# Patient Record
Sex: Female | Born: 1945
Health system: Southern US, Community
[De-identification: ages and names within clinical notes are randomized; demographics above are authoritative.]

## PROBLEM LIST (undated history)

## (undated) DIAGNOSIS — G479 Sleep disorder, unspecified: Secondary | ICD-10-CM

## (undated) DIAGNOSIS — I10 Essential (primary) hypertension: Secondary | ICD-10-CM

## (undated) DIAGNOSIS — Z8669 Personal history of other diseases of the nervous system and sense organs: Secondary | ICD-10-CM

## (undated) DIAGNOSIS — E119 Type 2 diabetes mellitus without complications: Secondary | ICD-10-CM

## (undated) DIAGNOSIS — Z9289 Personal history of other medical treatment: Secondary | ICD-10-CM

## (undated) DIAGNOSIS — M1990 Unspecified osteoarthritis, unspecified site: Secondary | ICD-10-CM

## (undated) DIAGNOSIS — M48061 Spinal stenosis, lumbar region without neurogenic claudication: Secondary | ICD-10-CM

## (undated) DIAGNOSIS — I639 Cerebral infarction, unspecified: Secondary | ICD-10-CM

## (undated) HISTORY — PX: CHOLECYSTECTOMY OPEN: SUR202

## (undated) HISTORY — PX: VAGINAL HYSTERECTOMY: SUR661

## (undated) HISTORY — PX: CERVICAL SPINE SURGERY: SHX589

## (undated) HISTORY — PX: CHOLECYSTECTOMY: SHX55

## (undated) HISTORY — PX: OOPHORECTOMY: SHX86

## (undated) HISTORY — PX: ABDOMINAL HYSTERECTOMY: SHX81

## (undated) HISTORY — PX: BLADDER SURGERY: SHX569

---

## 2006-12-27 ENCOUNTER — Ambulatory Visit: Payer: Self-pay | Admitting: Cardiology

## 2006-12-29 ENCOUNTER — Encounter (HOSPITAL_COMMUNITY): Admission: RE | Admit: 2006-12-29 | Discharge: 2007-01-28 | Payer: Self-pay | Admitting: Cardiology

## 2006-12-29 ENCOUNTER — Ambulatory Visit: Payer: Self-pay | Admitting: Cardiology

## 2007-01-02 ENCOUNTER — Ambulatory Visit: Payer: Self-pay | Admitting: Cardiology

## 2007-03-10 ENCOUNTER — Emergency Department (HOSPITAL_COMMUNITY): Admission: RE | Admit: 2007-03-10 | Discharge: 2007-03-10 | Payer: Self-pay | Admitting: Internal Medicine

## 2007-03-22 ENCOUNTER — Ambulatory Visit (HOSPITAL_COMMUNITY): Admission: RE | Admit: 2007-03-22 | Discharge: 2007-03-22 | Payer: Self-pay | Admitting: Orthopaedic Surgery

## 2007-03-28 ENCOUNTER — Encounter (HOSPITAL_COMMUNITY): Admission: RE | Admit: 2007-03-28 | Discharge: 2007-04-27 | Payer: Self-pay | Admitting: Orthopaedic Surgery

## 2007-07-26 ENCOUNTER — Ambulatory Visit (HOSPITAL_COMMUNITY): Admission: RE | Admit: 2007-07-26 | Discharge: 2007-07-27 | Payer: Self-pay | Admitting: Neurosurgery

## 2007-10-02 ENCOUNTER — Ambulatory Visit (HOSPITAL_COMMUNITY): Admission: RE | Admit: 2007-10-02 | Discharge: 2007-10-02 | Payer: Self-pay | Admitting: Neurosurgery

## 2008-01-23 ENCOUNTER — Ambulatory Visit (HOSPITAL_COMMUNITY): Admission: RE | Admit: 2008-01-23 | Discharge: 2008-01-23 | Payer: Self-pay | Admitting: Family Medicine

## 2008-03-07 ENCOUNTER — Encounter: Admission: RE | Admit: 2008-03-07 | Discharge: 2008-04-18 | Payer: Self-pay | Admitting: Specialist

## 2008-07-14 ENCOUNTER — Emergency Department (HOSPITAL_COMMUNITY): Admission: EM | Admit: 2008-07-14 | Discharge: 2008-07-14 | Payer: Self-pay | Admitting: Emergency Medicine

## 2011-04-13 NOTE — Op Note (Signed)
NAMEVONNIE, SPAGNOLO NO.:  [id number]   MEDICAL RECORD NO.:  [id number]          PATIENT TYPE:  OIB   LOCATION:  3027                         FACILITY:  [facility]   PHYSICIAN:  Hewitt Shorts, M.D.DATE OF BIRTH:  Feb 26, 1946   DATE OF PROCEDURE:  DATE OF DISCHARGE:                               OPERATIVE REPORT   PREOPERATIVE DIAGNOSES:  1. C5-6 cervical disk herniation.  2. Cervical spondylosis.  3. Cervical degenerative disk disease.  4. Cervical radiculopathy.   POSTOPERATIVE DIAGNOSES:  1. C5-6 cervical disk herniation.  2. Cervical spondylosis.  3. Cervical degenerative disk disease.  4. Cervical radiculopathy.   PROCEDURE:  C5-6 anterior cervical decompression and arthrodesis with  allograft and tethered cervical plating.   SURGEON:  Hewitt Shorts, M.D.   ASSISTANT:  Jerald Kief, NP and Coletta Memos, M.D.   ANESTHESIA:  General endotracheal.   INDICATIONS:  Patient is a 65 year old woman who presented with a right  cervical radiculopathy.  She was found to have advanced degenerative  disk disease and spondylosis at the C5-6 level with superimposed spinal  disk herniation.  Decision was made to proceed with anterior cervical  diskectomy and fusion.   PROCEDURE:  Patient was brought to the operating room, placed under  general endotracheal anesthesia.  Patient was placed in 10 pounds of  Holter traction.  The patient was prepped with Betadine and soap  solution and draped in a sterile fashion.  A horizontal incision was  made in the left side of the neck.  The line and incision were  infiltrated with local anesthetic with epinephrine.  Dissection was  carried down through the subcutaneous tissue and platysma and then  dissection was carried out through the avascular plane leaving the  sternocleidomastoid, carotid artery and jugular vein laterally and the  trachea and esophagus medially.  The central aspect of the vertebral  column was identified  and localizing x-rays taken.  The C5-6  intervertebral disk space was identified.  Diskectomy was begun with  incision of the annulus.  There were large ventral osteophytes that were  removed using the XMax drill and the osteophyte removal tool.  The disk  space was anterior and diskectomy performed using a variety of  microcurets and pituitary rongeurs.  The cartilaginous end plates were  removed using the microcurets along with the [facility] drill.  The microscope  was draped and brought into the field to provide additional  magnification, illumination, and visualization.  Then decompression was  continued using microdissection and microsurgical technique.  Posterior  osteophytic overgrowth was removed using the XMax drill and a 2 mm  Kerrison punch with a thin foot plate.  The posterior longitudinal  ligament was removed and spondylotic herniation was carefully removed,  decompressing the thecal sac and exiting C6 nerve roots.  Once the  spinal canal, thecal sac and neural foraminal nerve roots were  decompressed, hemostasis was accomplished using Gelfoam soaked in  thrombin.  We were able to remove the Gelfoam.  The wound was irrigated  and hemostasis confirmed.  We measured the height of the intervertebral  disk  space and selected a 7 mm implant, which was hydrated in saline  solution.  The allograft implant was then positioned in the  intervertebral disk space and countersunk.  Then we selected a 14 mm  tethered cervical plate.  It was positioned over the fusion concert and  secured to the vertebra by a pair of 4 x 13 mm variable angled screws at  each level.  Each of the screw holes were drilled and tapped and the  screws were placed in alternating fashion.  Once all 4 screws were in  place a final tightening was performed.  An x-ray was taken, which  showed the graft, plate and screws all to be in good position and  alignment to be good.  Then the wound was irrigated with Bacitracin   solution, checked for hemostasis which was confirmed and then we  proceeded with closure.  The platysma was closed with interrupted  inverted 2-0 undyed Vicryl sutures.  The subcutaneous and subcuticular  layers were closed with interrupted inverted 3-0 undyed Vicryl sutures  and the skin was re-approximated with Dermabond.  The procedure was  tolerated well.  The estimated blood loss was 50 mL.  Sponge and needle  count was correct.  Following surgery, patient was to be reversed from  anesthetic, extubated and transferred to the recovery room for further  care.      Hewitt Shorts, M.D.  Electronically Signed     RWN/MEDQ  D:  07/26/2007  T:  07/27/2007  Job:  [id number]   cc:   Hewitt Shorts, M.D.

## 2011-04-16 NOTE — Letter (Signed)
January 02, 2007    Virginia [name]  [address] [place]  [place], [place]  [address]   RE:  Virginia [name], Virginia [name]  MRN:  [id number]  /  DOB:  November 29, 1946   Dear Caryn Bee:   Virginia [name] returns to the office for continued assessment and  treatment of chest discomfort.  She has had no further episodes and thus  has not had occasion to use nitroglycerin.  She has felt fairly well  except for the day that she underwent stress testing.  That study showed  somewhat variable anteroseptal attenuation of activity which could be  related to breast shadowing.  A small degree of ischemia could not be  unequivocally excluded.   She has monitored blood pressure with good results.  Systolics have  typically been around 130 and diastolics around 80.  Her only medication  is lisinopril/HCTZ 20 mg/12.5 mg daily.   On exam, pleasant woman in no acute distress. The weight is 232, 3  pounds less than at her last visit.  Blood pressure 125/75.  Heart rate  100 and irregular.  Respirations 16.  NECK:  No jugular venous distention; normal carotid upstrokes without  bruits.  LUNGS:  Decreased breath sounds at the bases; otherwise clear.  CARDIAC:  Normal first and second heart sounds; fourth heart sound  present.  ABDOMEN:  Soft and nontender.  No organomegaly.  EXTREMITIES:  No edema.   IMPRESSION:  Virginia [name] is doing generally well.  We have excluded  high-risk coronary disease.  She will continue to monitor blood pressure  and symptoms and take nitroglycerin should chest discomfort recur.  I  will reassess this nice woman's symptoms in three months.    Sincerely,      Gerrit Friends. Dietrich Pates, MD, [facility]  Electronically Signed    RMR/MedQ  DD: 01/02/2007  DT: 01/02/2007  Job #: [id number]

## 2011-04-16 NOTE — Letter (Signed)
December 27, 2006    Virginia [name]  [address] [place]  [place], [place] [address]   RE:  LATEKA, RADY  MRN:  [id number]  /  DOB:  Nov 09, 1946   Dear Caryn Bee:   It was my pleasure evaluating Virginia [name] in consultation today at your  request.  As you know, this nice woman has had a history of hypertension  for at least the past 4 years.  She was treated transiently with a beta  blocker, but subsequently has received no treatment following weight  loss.  With subsequent weight gain, hypertension has recurred.  Systolic  blood pressures have been as high as 200.   Virginia [name] also has a recent history of chest discomfort, this  typically occurs at rest.  She describes sharp, intermittent mid  substernal pain of generally mild severity, but sometimes moderate with  radiation to the right jaw.  There is no associated dyspnea nor common  diaphoresis nor nausea.  She has quite a few additional symptoms  including insomnia, frequent urination, headaches, and fatigue.  She had  a sleep study at [facility] some years ago, revealing some sleep apnea, but  therapy was not recommended.   Lifestyle is sedentary.  The patient is most active when she is moving  about the office, which does not cause problems for her.  There is no  history of tobacco use.  She has had good cholesterol values in the  past.  She has no suggestion of diabetes.   PAST MEDICAL HISTORY:  Otherwise notable for multiple surgeries  including a hysterectomy and then a separate oophorectomy, subsequent  bladder resuspension, and cholecystectomy at age 65.  She had an episode  of sepsis for which she was transferred to [facility] but from  which she recovered without sequela.  She has not been hospitalized for  the past 10 years.  She suffered a retinal vein occlusion in the right  eye approximately 5 years ago that has resulted in blindness of that  eye.   The patient takes no medications routinely.  SHE HAS NO KNOWN  ALLERGIES.   SOCIAL HISTORY:  Works as a Public house manager in our [employer];  married with 2 children.   FAMILY HISTORY:  Father died at age 80 with congestive heart failure,  mother at age 52 with breast cancer.  Two siblings are alive and well.   REVIEW OF SYSTEMS:  Notable for the need for corrective lenses,  occasional palpitations, occasional nausea, urinary frequency and  nocturia, arthritic discomfort in the hands and intermittent edema of  the lower extremities.  All other systems reviewed and are negative.   EXAMINATION:  Overweight, pleasant woman.  The weight is 235, blood  pressure 170/90, heart rate 75 and regular, respirations 18.  HEENT:  Fundi are difficult to visualize; no gross abnormality is noted;  pupils are equal with good reaction to light on the left; EOMs full;  normal lids and conjunctivae.  Normal oral mucosa.  NECK: No jugular venous distention; normal carotid upstrokes without  bruits.  ENDOCRINE:  No thyromegaly.  HAEMATOPOIETIC:  No adenopathy.  LUNGS:  Clear.  CARDIAC:  Normal first and second heart sounds; fourth heart sound  present.  ABDOMEN:  Soft and nontender; no organomegaly; no masses.  EXTREMITIES:  Trace edema; distal pulses intact.  NEUROMUSCULAR:  Symmetric strength and tone; normal cranial nerves.   EKG:  Normal sinus rhythm; slightly delayed R wave progression; slight T  wave abnormality;  otherwise normal.   Recent laboratory obtained in your office is not currently available; we  are seeking those results.   IMPRESSION:  Virginia [name] has hypertension, likely to be essential.  We  will initiate therapy with a diuretic plus an angiotensin converting  enzyme inhibitor.  In addition to the basic laboratory obtained in your  office, a urinalysis will be obtained.  She probably does not need to  have renal artery stenosis or other secondary causes of hypertension  excluded at the present time.   Her chest discomfort is somewhat  worrisome in that radiation to the jaw  is uncommon except with discomfort of cardiac etiology.  We will give  her sublingual nitroglycerin in the way of a therapeutic trial.  A  stress Myoview study will be obtained.   We greatly appreciate the referral of this nice woman and will transmit  the results of her stress test to you as soon as it has been completed.    Sincerely,      Gerrit Friends. Dietrich Pates, MD, [facility]  Electronically Signed    RMR/MedQ  DD: 12/27/2006  DT: 12/27/2006  Job #: [id number]

## 2011-09-07 LAB — CREATININE, SERUM
Creatinine, Ser: 0.7
GFR calc Af Amer: >60
GFR calc non Af Amer: >60

## 2011-09-07 LAB — BUN: BUN: 12

## 2011-09-10 LAB — URINE MICROSCOPIC-ADD ON

## 2011-09-10 LAB — URINALYSIS, ROUTINE W REFLEX MICROSCOPIC
Bilirubin Urine: NEGATIVE
Glucose, UA: NEGATIVE
Ketones, ur: NEGATIVE
Nitrite: NEGATIVE
Protein, ur: NEGATIVE
Specific Gravity, Urine: 1.015
Urobilinogen, UA: 0.2
pH: 7.5

## 2011-09-10 LAB — BASIC METABOLIC PANEL
BUN: 16
CO2: 26
Calcium: 9.6
Chloride: 100
Creatinine, Ser: 0.81
GFR calc Af Amer: >60
GFR calc non Af Amer: >60
Glucose, Bld: 155 — ABNORMAL HIGH
Potassium: 3.7
Sodium: 135

## 2011-09-10 LAB — URINE CULTURE: Colony Count: >=100000

## 2011-09-10 LAB — CBC
HCT: 43
Hemoglobin: 14.6
MCHC: 33.9
MCV: 87.6
Platelets: 323
RBC: 4.9
RDW: 14.5 — ABNORMAL HIGH
WBC: 11.3 — ABNORMAL HIGH

## 2013-01-15 NOTE — Progress Notes (Signed)
Dr. Shelle Iron,         Virginia [name] ( DOB Apr 05, 2046 )   Added on for surgery Wed 2/19.  Please enter preop orders in Epic--pt is coming to [facility] tomorrow Tues 2/18 at 8:30 am for her preop labs.

## 2013-01-16 ENCOUNTER — Other Ambulatory Visit: Payer: Self-pay | Admitting: Orthopedic Surgery

## 2013-01-16 ENCOUNTER — Inpatient Hospital Stay (HOSPITAL_COMMUNITY): Admission: RE | Admit: 2013-01-16 | Payer: Self-pay | Source: Ambulatory Visit

## 2013-01-17 ENCOUNTER — Encounter (HOSPITAL_COMMUNITY): Payer: Self-pay | Admitting: Pharmacy Technician

## 2013-01-25 ENCOUNTER — Other Ambulatory Visit: Payer: Self-pay | Admitting: Orthopedic Surgery

## 2013-01-25 ENCOUNTER — Encounter (HOSPITAL_COMMUNITY)
Admission: RE | Admit: 2013-01-25 | Discharge: 2013-01-25 | Disposition: A | Discharge status: Home / Self Care | Payer: Medicare Other | Source: Ambulatory Visit | Attending: Specialist | Admitting: Specialist

## 2013-01-25 ENCOUNTER — Encounter (HOSPITAL_COMMUNITY): Payer: Self-pay

## 2013-01-25 ENCOUNTER — Ambulatory Visit (HOSPITAL_COMMUNITY)
Admission: RE | Admit: 2013-01-25 | Discharge: 2013-01-25 | Disposition: A | Discharge status: Home / Self Care | Payer: Medicare Other | Source: Ambulatory Visit | Attending: Orthopedic Surgery | Admitting: Orthopedic Surgery

## 2013-01-25 DIAGNOSIS — I728 Aneurysm of other specified arteries: Secondary | ICD-10-CM | POA: Insufficient documentation

## 2013-01-25 DIAGNOSIS — Z01812 Encounter for preprocedural laboratory examination: Secondary | ICD-10-CM | POA: Insufficient documentation

## 2013-01-25 DIAGNOSIS — Z981 Arthrodesis status: Secondary | ICD-10-CM | POA: Insufficient documentation

## 2013-01-25 DIAGNOSIS — Z01810 Encounter for preprocedural cardiovascular examination: Secondary | ICD-10-CM | POA: Insufficient documentation

## 2013-01-25 DIAGNOSIS — Z01818 Encounter for other preprocedural examination: Secondary | ICD-10-CM | POA: Insufficient documentation

## 2013-01-25 HISTORY — DX: Type 2 diabetes mellitus without complications: E11.9

## 2013-01-25 HISTORY — DX: Cerebral infarction, unspecified: I63.9

## 2013-01-25 HISTORY — DX: Unspecified osteoarthritis, unspecified site: M19.90

## 2013-01-25 HISTORY — DX: Sleep disorder, unspecified: G47.9

## 2013-01-25 HISTORY — DX: Spinal stenosis, lumbar region without neurogenic claudication: M48.061

## 2013-01-25 HISTORY — DX: Essential (primary) hypertension: I10

## 2013-01-25 LAB — CBC
Platelets: 196 10*3/uL (ref 150–400)
RDW: 12.7 % (ref 11.5–15.5)
WBC: 6.8 10*3/uL (ref 4.0–10.5)

## 2013-01-25 LAB — BASIC METABOLIC PANEL
BUN: 9 mg/dL (ref 6–23)
CO2: 25 mEq/L (ref 19–32)
Calcium: 9.2 mg/dL (ref 8.4–10.5)
Chloride: 94 mEq/L — ABNORMAL LOW (ref 96–112)
GFR calc Af Amer: >90 mL/min (ref >90)
Glucose, Bld: 461 mg/dL — ABNORMAL HIGH (ref 70–99)
Potassium: 4 mEq/L (ref 3.5–5.1)
Sodium: 131 mEq/L — ABNORMAL LOW (ref 135–145)

## 2013-01-25 LAB — SURGICAL PCR SCREEN: Staphylococcus aureus: NEGATIVE

## 2013-01-25 NOTE — Patient Instructions (Signed)
Virginia [name]  01/25/2013                           YOUR PROCEDURE IS SCHEDULED ON: 01/31/13               PLEASE REPORT TO SHORT STAY CENTER AT : 6:00 AM               CALL THIS NUMBER IF ANY PROBLEMS THE DAY OF SURGERY :               [phone]                      REMEMBER:   Do not eat food or drink liquids AFTER MIDNIGHT   Take these medicines the morning of surgery with A SIP OF WATER:  GABAPENTIN / HYDROCODONE IF NEEDED / TAKE 1/2 DOSE OF EVENING INSULIN (25 UNITS) THE NIGHT BEFORE SURGERY   Do not wear jewelry, make-up   Do not wear lotions, powders, or perfumes.   Do not shave legs or underarms 12 hrs. before surgery (men may shave face)  Do not bring valuables to the hospital.  Contacts, dentures or bridgework may not be worn into surgery.  Leave suitcase in the car. After surgery it may be brought to your room.  For patients admitted to the hospital more than one night, checkout time is 11:00                          The day of discharge.   Patients discharged the day of surgery will not be allowed to drive home                             If going home same day of surgery, must have someone stay with you first                           24 hrs at home and arrange for some one to drive you home from hospital.    Special Instructions:   Please read over the following fact sheets that you were given:               1. MRSA  INFORMATION                      2. [place] PREPARING FOR SURGERY SHEET               3. INCENTIVE SPIROMETER                                                X_____________________________________________________________________        Failure to follow these instructions may result in cancellation of your surgery

## 2013-01-25 NOTE — Progress Notes (Signed)
01/25/13 0849  OBSTRUCTIVE SLEEP APNEA  Have you ever been diagnosed with sleep apnea through a sleep study? No  Do you snore loudly (loud enough to be heard through closed doors)?  0  Do you often feel tired, fatigued, or sleepy during the daytime? 1  Has anyone observed you stop breathing during your sleep? 0  Do you have, or are you being treated for high blood pressure? 1  BMI more than 35 kg/m2? 1  Age over 66 years old? 1  Neck circumference greater than 40 cm/18 inches? 0  Gender: 0  Obstructive Sleep Apnea Score 4  Score 4 or greater  Results sent to PCP

## 2013-01-25 NOTE — Progress Notes (Signed)
Spoke with Cordelia Pen at office concerning elevated glucose. - per Dr. Shelle Iron and Janeann Merl - requested pt to have repeat fasting glucose drawn . I spoke with Ms Yingst and she will come in tomorrow for redraw.

## 2013-01-26 ENCOUNTER — Encounter (HOSPITAL_COMMUNITY)
Admission: RE | Admit: 2013-01-26 | Discharge: 2013-01-26 | Disposition: A | Discharge status: Home / Self Care | Payer: Medicare Other | Source: Ambulatory Visit | Attending: Specialist | Admitting: Specialist

## 2013-01-26 LAB — GLUCOSE, RANDOM: Glucose, Bld: 214 mg/dL — ABNORMAL HIGH (ref 70–99)

## 2013-01-26 NOTE — Progress Notes (Signed)
Pt arrived for fasting glucose draw - results 214 - faxed to Dr. Shelle Iron

## 2013-01-31 ENCOUNTER — Encounter (HOSPITAL_COMMUNITY): Payer: Self-pay | Admitting: *Deleted

## 2013-01-31 ENCOUNTER — Encounter (HOSPITAL_COMMUNITY): Payer: Self-pay | Admitting: Anesthesiology

## 2013-01-31 ENCOUNTER — Ambulatory Visit (HOSPITAL_COMMUNITY): Payer: Medicare Other

## 2013-01-31 ENCOUNTER — Ambulatory Visit (HOSPITAL_COMMUNITY): Payer: Medicare Other | Admitting: Anesthesiology

## 2013-01-31 ENCOUNTER — Observation Stay (HOSPITAL_COMMUNITY)
Admission: RE | Admit: 2013-01-31 | Discharge: 2013-02-01 | Disposition: A | Discharge status: Home / Self Care | Payer: Medicare Other | Source: Ambulatory Visit | Attending: Specialist | Admitting: Specialist

## 2013-01-31 ENCOUNTER — Encounter (HOSPITAL_COMMUNITY)
Admission: RE | Disposition: A | Discharge status: Home / Self Care | Payer: Self-pay | Source: Ambulatory Visit | Attending: Specialist

## 2013-01-31 DIAGNOSIS — Z01812 Encounter for preprocedural laboratory examination: Secondary | ICD-10-CM | POA: Insufficient documentation

## 2013-01-31 DIAGNOSIS — I69998 Other sequelae following unspecified cerebrovascular disease: Secondary | ICD-10-CM | POA: Insufficient documentation

## 2013-01-31 DIAGNOSIS — E119 Type 2 diabetes mellitus without complications: Secondary | ICD-10-CM | POA: Insufficient documentation

## 2013-01-31 DIAGNOSIS — H539 Unspecified visual disturbance: Secondary | ICD-10-CM | POA: Insufficient documentation

## 2013-01-31 DIAGNOSIS — I10 Essential (primary) hypertension: Secondary | ICD-10-CM | POA: Insufficient documentation

## 2013-01-31 DIAGNOSIS — Z7982 Long term (current) use of aspirin: Secondary | ICD-10-CM | POA: Insufficient documentation

## 2013-01-31 DIAGNOSIS — M48062 Spinal stenosis, lumbar region with neurogenic claudication: Principal | ICD-10-CM

## 2013-01-31 DIAGNOSIS — Z79899 Other long term (current) drug therapy: Secondary | ICD-10-CM | POA: Insufficient documentation

## 2013-01-31 DIAGNOSIS — Z794 Long term (current) use of insulin: Secondary | ICD-10-CM | POA: Insufficient documentation

## 2013-01-31 HISTORY — PX: LUMBAR LAMINECTOMY/DECOMPRESSION MICRODISCECTOMY: SHX5026

## 2013-01-31 LAB — GLUCOSE, CAPILLARY
Glucose-Capillary: 74 mg/dL (ref 70–99)
Glucose-Capillary: 158 mg/dL — ABNORMAL HIGH (ref 70–99)

## 2013-01-31 SURGERY — LUMBAR LAMINECTOMY/DECOMPRESSION MICRODISCECTOMY 1 LEVEL
Anesthesia: General | Wound class: Clean

## 2013-01-31 MED ORDER — EPHEDRINE SULFATE 50 MG/ML IJ SOLN
INTRAMUSCULAR | Status: DC | PRN
  Administered 2013-01-31: 5 mg via INTRAVENOUS

## 2013-01-31 MED ORDER — ROCURONIUM BROMIDE 100 MG/10ML IV SOLN
INTRAVENOUS | Status: DC | PRN
  Administered 2013-01-31: 5 mg via INTRAVENOUS
  Administered 2013-01-31: 35 mg via INTRAVENOUS

## 2013-01-31 MED ORDER — ACETAMINOPHEN 10 MG/ML IV SOLN
INTRAVENOUS | Status: DC | PRN
  Administered 2013-01-31: 1000 mg via INTRAVENOUS

## 2013-01-31 MED ORDER — METHOCARBAMOL 500 MG PO TABS
500.0000 mg | ORAL_TABLET | Freq: Four times a day (QID) | ORAL | Status: DC | PRN
  Administered 2013-01-31 – 2013-02-01 (×4): 500 mg via ORAL
  Filled 2013-01-31 (×3): qty 1

## 2013-01-31 MED ORDER — GABAPENTIN 300 MG PO CAPS
600.0000 mg | ORAL_CAPSULE | Freq: Every day | ORAL | Status: DC
  Administered 2013-01-31: 600 mg via ORAL
  Filled 2013-01-31 (×2): qty 2

## 2013-01-31 MED ORDER — CEFAZOLIN SODIUM-DEXTROSE 2-3 GM-% IV SOLR
2.0000 g | Freq: Three times a day (TID) | INTRAVENOUS | Status: AC
  Administered 2013-01-31 (×2): 2 g via INTRAVENOUS
  Filled 2013-01-31 (×2): qty 50

## 2013-01-31 MED ORDER — POTASSIUM CHLORIDE IN NACL 20-0.9 MEQ/L-% IV SOLN
INTRAVENOUS | Status: DC
  Administered 2013-01-31: 15:00:00 via INTRAVENOUS
  Filled 2013-01-31 (×2): qty 1000

## 2013-01-31 MED ORDER — GLYCOPYRROLATE 0.2 MG/ML IJ SOLN
INTRAMUSCULAR | Status: DC | PRN
  Administered 2013-01-31: 0.6 mg via INTRAVENOUS

## 2013-01-31 MED ORDER — LACTATED RINGERS IV SOLN
INTRAVENOUS | Status: DC
  Administered 2013-01-31 (×2): via INTRAVENOUS

## 2013-01-31 MED ORDER — METHOCARBAMOL 100 MG/ML IJ SOLN
500.0000 mg | Freq: Four times a day (QID) | INTRAVENOUS | Status: DC | PRN
  Filled 2013-01-31: qty 5

## 2013-01-31 MED ORDER — INSULIN ASPART 100 UNIT/ML ~~LOC~~ SOLN
10.0000 [IU] | Freq: Three times a day (TID) | SUBCUTANEOUS | Status: DC
  Administered 2013-01-31: 10 [IU] via SUBCUTANEOUS

## 2013-01-31 MED ORDER — INSULIN LISPRO 100 UNIT/ML ~~LOC~~ SOLN: 10.0000 [IU] | Freq: Three times a day (TID) | SUBCUTANEOUS | Status: DC

## 2013-01-31 MED ORDER — DEXTROSE 5 % IV SOLN
INTRAVENOUS | Status: DC | PRN
  Administered 2013-01-31: 09:00:00 via INTRAVENOUS

## 2013-01-31 MED ORDER — HEMOSTATIC AGENTS (NO CHARGE) OPTIME
TOPICAL | Status: DC | PRN
  Administered 2013-01-31: 1 via TOPICAL

## 2013-01-31 MED ORDER — CHLORHEXIDINE GLUCONATE 4 % EX LIQD
60.0000 mL | Freq: Once | CUTANEOUS | Status: DC
  Filled 2013-01-31: qty 60

## 2013-01-31 MED ORDER — INSULIN GLARGINE 100 UNIT/ML ~~LOC~~ SOLN
50.0000 [IU] | Freq: Two times a day (BID) | SUBCUTANEOUS | Status: DC
  Administered 2013-02-01: 50 [IU] via SUBCUTANEOUS

## 2013-01-31 MED ORDER — SODIUM CHLORIDE 0.9 % IV SOLN: 250.0000 mL | INTRAVENOUS | Status: DC

## 2013-01-31 MED ORDER — PROMETHAZINE HCL 25 MG/ML IJ SOLN: 6.2500 mg | INTRAMUSCULAR | Status: DC | PRN

## 2013-01-31 MED ORDER — GABAPENTIN 300 MG PO CAPS
300.0000 mg | ORAL_CAPSULE | Freq: Every day | ORAL | Status: DC
  Administered 2013-02-01: 300 mg via ORAL
  Filled 2013-01-31: qty 1

## 2013-01-31 MED ORDER — INSULIN ASPART 100 UNIT/ML ~~LOC~~ SOLN
0.0000 [IU] | Freq: Three times a day (TID) | SUBCUTANEOUS | Status: DC
  Administered 2013-02-01: 3 [IU] via SUBCUTANEOUS

## 2013-01-31 MED ORDER — LISINOPRIL 10 MG PO TABS
10.0000 mg | ORAL_TABLET | Freq: Every day | ORAL | Status: DC
  Administered 2013-02-01: 10 mg via ORAL
  Filled 2013-01-31: qty 1

## 2013-01-31 MED ORDER — SODIUM CHLORIDE 0.9 % IR SOLN
Status: DC | PRN
  Administered 2013-01-31: 09:00:00

## 2013-01-31 MED ORDER — SUCCINYLCHOLINE CHLORIDE 20 MG/ML IJ SOLN
INTRAMUSCULAR | Status: DC | PRN
  Administered 2013-01-31: 100 mg via INTRAVENOUS

## 2013-01-31 MED ORDER — FENTANYL CITRATE 0.05 MG/ML IJ SOLN
INTRAMUSCULAR | Status: DC | PRN
  Administered 2013-01-31: 100 ug via INTRAVENOUS
  Administered 2013-01-31 (×3): 50 ug via INTRAVENOUS

## 2013-01-31 MED ORDER — HYDROMORPHONE HCL PF 1 MG/ML IJ SOLN
0.5000 mg | INTRAMUSCULAR | Status: DC | PRN
  Administered 2013-01-31 (×3): 1 mg via INTRAVENOUS
  Filled 2013-01-31 (×3): qty 1

## 2013-01-31 MED ORDER — CITALOPRAM HYDROBROMIDE 20 MG PO TABS
20.0000 mg | ORAL_TABLET | Freq: Every day | ORAL | Status: DC
  Administered 2013-01-31: 20 mg via ORAL
  Filled 2013-01-31 (×2): qty 1

## 2013-01-31 MED ORDER — MEPERIDINE HCL 50 MG/ML IJ SOLN: 6.2500 mg | INTRAMUSCULAR | Status: DC | PRN

## 2013-01-31 MED ORDER — ONDANSETRON HCL 4 MG/2ML IJ SOLN: 4.0000 mg | INTRAMUSCULAR | Status: DC | PRN

## 2013-01-31 MED ORDER — NEOSTIGMINE METHYLSULFATE 1 MG/ML IJ SOLN
INTRAMUSCULAR | Status: DC | PRN
  Administered 2013-01-31: 4 mg via INTRAVENOUS

## 2013-01-31 MED ORDER — BUPIVACAINE-EPINEPHRINE 0.5% -1:200000 IJ SOLN
INTRAMUSCULAR | Status: DC | PRN
  Administered 2013-01-31: 10 mL

## 2013-01-31 MED ORDER — ONDANSETRON HCL 4 MG/2ML IJ SOLN
INTRAMUSCULAR | Status: DC | PRN
  Administered 2013-01-31: 4 mg via INTRAVENOUS

## 2013-01-31 MED ORDER — LIDOCAINE HCL (CARDIAC) 20 MG/ML IV SOLN
INTRAVENOUS | Status: DC | PRN
  Administered 2013-01-31: 50 mg via INTRAVENOUS

## 2013-01-31 MED ORDER — THROMBIN 5000 UNITS EX SOLR
CUTANEOUS | Status: DC | PRN
  Administered 2013-01-31: 10000 [IU] via TOPICAL

## 2013-01-31 MED ORDER — ACETAMINOPHEN 650 MG RE SUPP: 650.0000 mg | RECTAL | Status: DC | PRN

## 2013-01-31 MED ORDER — HYDROMORPHONE HCL PF 1 MG/ML IJ SOLN
0.2500 mg | INTRAMUSCULAR | Status: DC | PRN
  Administered 2013-01-31 (×2): 0.5 mg via INTRAVENOUS

## 2013-01-31 MED ORDER — OXYCODONE-ACETAMINOPHEN 5-325 MG PO TABS
1.0000 | ORAL_TABLET | ORAL | Status: DC | PRN
  Administered 2013-01-31 – 2013-02-01 (×4): 2 via ORAL
  Filled 2013-01-31 (×4): qty 2

## 2013-01-31 MED ORDER — PHENOL 1.4 % MT LIQD
1.0000 | OROMUCOSAL | Status: DC | PRN
  Filled 2013-01-31: qty 177

## 2013-01-31 MED ORDER — MENTHOL 3 MG MT LOZG
1.0000 | LOZENGE | OROMUCOSAL | Status: DC | PRN
  Filled 2013-01-31: qty 9

## 2013-01-31 MED ORDER — ACETAMINOPHEN 325 MG PO TABS: 650.0000 mg | ORAL_TABLET | ORAL | Status: DC | PRN

## 2013-01-31 MED ORDER — CEFAZOLIN SODIUM-DEXTROSE 2-3 GM-% IV SOLR
2.0000 g | INTRAVENOUS | Status: AC
  Administered 2013-01-31: 2 g via INTRAVENOUS

## 2013-01-31 MED ORDER — GABAPENTIN 300 MG PO CAPS: 300.0000 mg | ORAL_CAPSULE | Freq: Three times a day (TID) | ORAL | Status: DC

## 2013-01-31 MED ORDER — OXYCODONE-ACETAMINOPHEN 5-325 MG PO TABS: 1.0000 | ORAL_TABLET | ORAL | Status: DC | PRN

## 2013-01-31 MED ORDER — DOCUSATE SODIUM 100 MG PO CAPS
100.0000 mg | ORAL_CAPSULE | Freq: Two times a day (BID) | ORAL | Status: DC
  Administered 2013-01-31 – 2013-02-01 (×3): 100 mg via ORAL

## 2013-01-31 MED ORDER — HYDROCODONE-ACETAMINOPHEN 5-325 MG PO TABS
1.0000 | ORAL_TABLET | ORAL | Status: DC | PRN
  Administered 2013-02-01: 2 via ORAL
  Filled 2013-01-31: qty 2

## 2013-01-31 MED ORDER — GLIPIZIDE ER 10 MG PO TB24
10.0000 mg | ORAL_TABLET | Freq: Two times a day (BID) | ORAL | Status: DC
  Administered 2013-01-31 – 2013-02-01 (×2): 10 mg via ORAL
  Filled 2013-01-31 (×4): qty 1

## 2013-01-31 MED ORDER — SODIUM CHLORIDE 0.9 % IJ SOLN: 3.0000 mL | Freq: Two times a day (BID) | INTRAMUSCULAR | Status: DC

## 2013-01-31 MED ORDER — MIDAZOLAM HCL 5 MG/5ML IJ SOLN
INTRAMUSCULAR | Status: DC | PRN
  Administered 2013-01-31: 2 mg via INTRAVENOUS

## 2013-01-31 MED ORDER — LACTATED RINGERS IV SOLN: INTRAVENOUS | Status: DC

## 2013-01-31 MED ORDER — SODIUM CHLORIDE 0.9 % IJ SOLN: 3.0000 mL | INTRAMUSCULAR | Status: DC | PRN

## 2013-01-31 MED ORDER — PROPOFOL 10 MG/ML IV BOLUS
INTRAVENOUS | Status: DC | PRN
  Administered 2013-01-31: 160 mg via INTRAVENOUS

## 2013-01-31 SURGICAL SUPPLY — 46 items
BAG ZIPLOCK 12X15 (MISCELLANEOUS) ×2 IMPLANT
BENZOIN TINCTURE PRP APPL 2/3 (GAUZE/BANDAGES/DRESSINGS) ×4 IMPLANT
CHLORAPREP W/TINT 26ML (MISCELLANEOUS) ×2 IMPLANT
CLEANER TIP ELECTROSURG 2X2 (MISCELLANEOUS) ×2 IMPLANT
CLOTH BEACON ORANGE TIMEOUT ST (SAFETY) ×2 IMPLANT
DECANTER SPIKE VIAL GLASS SM (MISCELLANEOUS) ×2 IMPLANT
DRAPE MICROSCOPE LEICA (MISCELLANEOUS) ×2 IMPLANT
DRAPE POUCH INSTRU U-SHP 10X18 (DRAPES) ×2 IMPLANT
DRAPE SURG 17X11 SM STRL (DRAPES) ×2 IMPLANT
DRSG AQUACEL AG ADV 3.5X 6 (GAUZE/BANDAGES/DRESSINGS) ×2 IMPLANT
DRSG EMULSION OIL 3X3 NADH (GAUZE/BANDAGES/DRESSINGS) IMPLANT
DRSG TELFA 4X5 ISLAND ADH (GAUZE/BANDAGES/DRESSINGS) IMPLANT
ELECT REM PT RETURN 9FT ADLT (ELECTROSURGICAL) ×2
ELECTRODE REM PT RTRN 9FT ADLT (ELECTROSURGICAL) ×1 IMPLANT
GLOVE BIOGEL PI IND STRL 7.5 (GLOVE) ×1 IMPLANT
GLOVE BIOGEL PI IND STRL 8 (GLOVE) ×1 IMPLANT
GLOVE BIOGEL PI INDICATOR 7.5 (GLOVE) ×1
GLOVE BIOGEL PI INDICATOR 8 (GLOVE) ×1
GLOVE SURG SS PI 7.5 STRL IVOR (GLOVE) ×2 IMPLANT
GLOVE SURG SS PI 8.0 STRL IVOR (GLOVE) ×4 IMPLANT
GOWN PREVENTION PLUS LG XLONG (DISPOSABLE) ×2 IMPLANT
GOWN STRL REIN XL XLG (GOWN DISPOSABLE) ×4 IMPLANT
KIT BASIN OR (CUSTOM PROCEDURE TRAY) ×2 IMPLANT
KIT POSITIONING SURG ANDREWS (MISCELLANEOUS) ×2 IMPLANT
MANIFOLD NEPTUNE II (INSTRUMENTS) ×2 IMPLANT
NEEDLE SPNL 18GX3.5 QUINCKE PK (NEEDLE) ×6 IMPLANT
PATTIES SURGICAL .5 X.5 (GAUZE/BANDAGES/DRESSINGS) ×2 IMPLANT
PATTIES SURGICAL .75X.75 (GAUZE/BANDAGES/DRESSINGS) ×2 IMPLANT
PATTIES SURGICAL 1X1 (DISPOSABLE) ×2 IMPLANT
SPONGE SURGIFOAM ABS GEL 100 (HEMOSTASIS) ×2 IMPLANT
STAPLER VISISTAT (STAPLE) IMPLANT
STRIP CLOSURE SKIN 1/2X4 (GAUZE/BANDAGES/DRESSINGS) ×2 IMPLANT
SUT PROLENE 3 0 PS 2 (SUTURE) ×2 IMPLANT
SUT VIC AB 0 CT1 27 (SUTURE)
SUT VIC AB 0 CT1 27XBRD ANTBC (SUTURE) IMPLANT
SUT VIC AB 1 CT1 27 (SUTURE) ×1
SUT VIC AB 1 CT1 27XBRD ANTBC (SUTURE) ×1 IMPLANT
SUT VIC AB 1-0 CT2 27 (SUTURE) ×2 IMPLANT
SUT VIC AB 2-0 CT1 27 (SUTURE) ×1
SUT VIC AB 2-0 CT1 TAPERPNT 27 (SUTURE) ×1 IMPLANT
SUT VIC AB 2-0 CT2 27 (SUTURE) ×2 IMPLANT
SUT VICRYL 0 27 CT2 27 ABS (SUTURE) IMPLANT
SUT VICRYL 0 UR6 27IN ABS (SUTURE) IMPLANT
SYRINGE 10CC LL (SYRINGE) ×2 IMPLANT
TRAY LAMINECTOMY (CUSTOM PROCEDURE TRAY) ×2 IMPLANT
YANKAUER SUCT BULB TIP NO VENT (SUCTIONS) ×2 IMPLANT

## 2013-01-31 NOTE — H&P (Signed)
Virginia [name] is an 67 y.o. female.   Chief Complaint: Leg pain with ambulation HPI: Refractory stenosis neurogenic claudication.  Past Medical History  Diagnosis Date  . Hypertension   . Stroke     behind rt eye / visual defficiet rt eye  . Spinal stenosis of lumbar region   . Arthritis   . Diabetes mellitus without complication   . Difficulty sleeping     due to back pain    Past Surgical History  Procedure Laterality Date  . Cholecystectomy    . Abdominal hysterectomy    . Oophorectomy    . Bladder surgery      bladder tack  . Cervical spine surgery      cervical fusion    History reviewed. No pertinent family history. Social History:  reports that she has never smoked. She does not have any smokeless tobacco history on file. She reports that she does not drink alcohol or use illicit drugs.  Allergies:  Allergies  Allergen Reactions  . Shellfish Allergy Anaphylaxis    Medications Prior to Admission  Medication Sig Dispense Refill  . aspirin EC 81 MG tablet Take 81 mg by mouth daily.      . citalopram (CELEXA) 20 MG tablet Take 20 mg by mouth at bedtime.      . gabapentin (NEURONTIN) 300 MG capsule Take 300-600 mg by mouth 3 (three) times daily.      Marland Kitchen glipiZIDE (GLUCOTROL XL) 10 MG 24 hr tablet Take 10 mg by mouth 2 (two) times daily.      Marland Kitchen HYDROcodone-acetaminophen (NORCO) 10-325 MG per tablet Take 1-2 tablets by mouth every 6 (six) hours as needed for pain.      Marland Kitchen insulin glargine (LANTUS) 100 UNIT/ML injection Inject 50 Units into the skin 2 (two) times daily.      . insulin lispro (HUMALOG) 100 UNIT/ML injection Inject 10 Units into the skin 3 (three) times daily before meals.      Marland Kitchen acetaminophen (TYLENOL) 500 MG tablet Take 1,000 mg by mouth every 6 (six) hours as needed for pain.      Marland Kitchen ibuprofen (ADVIL,MOTRIN) 200 MG tablet Take 600 mg by mouth every 6 (six) hours as needed for pain.      Marland Kitchen lisinopril (PRINIVIL,ZESTRIL) 10 MG tablet Take 10 mg by mouth  daily.        Results for orders placed during the hospital encounter of 01/31/13 (from the past 48 hour(s))  GLUCOSE, CAPILLARY     Status: None   Collection Time    01/31/13  6:23 AM      Result Value Range   Glucose-Capillary 80  70 - 99 mg/dL   Comment 1 Documented in Chart    GLUCOSE, CAPILLARY     Status: None   Collection Time    01/31/13  7:35 AM      Result Value Range   Glucose-Capillary 74  70 - 99 mg/dL   No results found.  Review of Systems  Neurological: Positive for sensory change and focal weakness.  All other systems reviewed and are negative.    Blood pressure 140/60, pulse 60, temperature 98.3 F (36.8 C), temperature source Oral, resp. rate 18, SpO2 95.00%. Physical Exam  Vitals reviewed. Constitutional: She is oriented to person, place, and time. She appears well-developed.  HENT:  Head: Normocephalic.  Eyes: Pupils are equal, round, and reactive to light.  Neck: Normal range of motion.  Cardiovascular: Normal rate.   Respiratory: Effort  normal.  GI: Soft.  Musculoskeletal:  Bilat EHL 5-/5. No DVT . Decreased sensation L5.  Neurological: She is alert and oriented to person, place, and time.  Skin: Skin is warm and dry.  Psychiatric: She has a normal mood and affect.   MRI: multifactoral spinal stenosis L45.  Assessment/Plan Refractory neurogenic claudication due to Spinal stenosis L45. Plan decompression. Risks discussed.  BEANE,JEFFREY C 01/31/2013, 7:59 AM

## 2013-01-31 NOTE — Brief Op Note (Signed)
01/31/2013  10:06 AM  PATIENT:  Virginia [name]  67 y.o. female  PRE-OPERATIVE DIAGNOSIS:  STENOSIS L4-5  POST-OPERATIVE DIAGNOSIS:  STENOSIS L4-5  PROCEDURE:  Procedure(s) with comments: MICRO LUMBAR DECOMPRESSION L4-5 (N/A) - Bilateral Decompression L4-L5  SURGEON:  Surgeon(s) and Role:    * Javier Docker, MD - Primary  PHYSICIAN ASSISTANT:   ASSISTANTS: Bissell   ANESTHESIA:   general  EBL:     BLOOD ADMINISTERED:none  DRAINS: none   LOCAL MEDICATIONS USED:  MARCAINE     SPECIMEN:  No Specimen  DISPOSITION OF SPECIMEN:  N/A  COUNTS:  YES  TOURNIQUET:  * No tourniquets in log *  DICTATION: .Other Dictation: Dictation Number [id number]  PLAN OF CARE: Admit for overnight observation  PATIENT DISPOSITION:  PACU - hemodynamically stable.   Delay start of Pharmacological VTE agent (>24hrs) due to surgical blood loss or risk of bleeding: yes

## 2013-01-31 NOTE — Op Note (Signed)
Virginia [name] NO.:  [id number]  MEDICAL RECORD NO.:  [id number]  LOCATION:  1608                         FACILITY:  [facility]  PHYSICIAN:  Jene Every, M.D.    DATE OF BIRTH:  1946-06-02  DATE OF PROCEDURE:  01/31/2013 DATE OF DISCHARGE:                              OPERATIVE REPORT   PREOPERATIVE DIAGNOSIS:  Spinal stenosis at L4-5.  POSTOPERATIVE DIAGNOSIS:  Spinal stenosis at L4-5.  PROCEDURE PERFORMED: 1. Bilateral hemilaminotomies, microscopic with lateral recess     decompression. 2. Foraminotomies of L4 and L5 bilaterally.  ANESTHESIA:  General.  ASSISTANT:  Lanna Poche, PA.  HISTORY:  A 67 year old with lower extremity radicular pain secondary to severe lateral recess stenosis.  She had a minimal listhesis without instability or back pain.  Only leg pain.  She was indicated for decompression given the severity of lateral recess.  Risks and benefits discussed including bleeding, infection, damage to neurovascular structures, DVT, PE, anesthetic complications, etc.  TECHNIQUE:  With the patient in supine position, after induction of adequate general anesthesia, 2 g Kefzol, she was placed prone on the [place] frame.  All bony prominences were well padded.  Lumbar region was prepped draped in usual sterile fashion.  The patient had reported history of shellfish allergy, but she specifically reported able to use Betadine topically on her skin.  She had a preoperative Betadine wash in short-stay without complication.  We though, however, used ChloraPrep. Two 18-gauge spinal needles were utilized to localize the L4-5 interspace, confirmed with x-ray.  Incision was made from the spinous process of L4-5.  Subcutaneous tissue was dissected.  Electrocautery was utilized to achieve hemostasis.  Dorsolumbar fascia identified and divided in line with skin incision.  Paraspinous muscle elevated from lamina of 4-5.  McCulloch retractor was placed.   Operating microscope was draped, brought into the surgical field.  After confirmatory radiograph, there was small interlaminar window bilaterally.  Removed ligamentum flavum from the interspace 4-5.  Preserving neural arch of 4, we used a curette to detach ligamentum flavum from the cephalad edge of 5.  Then, the caudad edge of 4 by 2 and 3 mm Kerrisons.  The operating microscope was draped and brought into the surgical field.  __________ placed beneath the ligamentum flavum, performed hemilaminotomy of L5 bilaterally.  From separate sides of the operating room table we decompressed the lateral recesses to the medial border of the pedicle, actually was more severe on the left compared to the right. Foraminotomies of 4 and 5 were performed bilaterally from the opposite sides of the table and again, more stenotic at 4 and 5 on the left than on the right.  Some attenuation of the thecal sac was noted in the midline; however, no CSF leakage was noted.  Bipolar electrocautery was utilized to achieve hemostasis.  Inspection of the disk bilaterally revealed no disk herniation.  Neural arch was retained.  Woodson retractor probe passed freely up the foramen of 4 and 5 following decompression.  Again, there was severe compression in the lateral recesses bilaterally, left greater than right.  The probe passed freely to above pedicle 4 without difficulty, and then the foramen of 5.  We obtained a confirmatory radiograph with instruments in the foramen of 4 and 5.  Next, copiously irrigated the wound.  Inspection revealed no CSF leakage or active bleeding.  Thrombin-soaked Gelfoam was placed in the laminotomy defect.  We removed the [facility] retractor.  We irrigated the paraspinous muscles.  No evidence of active bleeding.  I closed the fascia with #1 Vicryl interrupted figure-of-eight sutures, subcu with 2- 0 sutures, skin with Prolene subcuticular.  Wound reinforced with Steri- Strips.  Sterile  dressing applied.  Placed supine on the hospital bed, extubated without difficulty, and transported to the recovery room in satisfactory condition.  The patient tolerated the procedure well.  No complications.  Minimal blood loss.  Assistant, Lanna Poche, PA, which was necessary for gentle intermittent neural retraction, suction, and patient positioning.     Jene Every, M.D.     Cordelia Pen  D:  01/31/2013  T:  01/31/2013  Job:  [id number]

## 2013-01-31 NOTE — Transfer of Care (Signed)
Immediate Anesthesia Transfer of Care Note  Patient: Virginia [name]  Procedure(s) Performed: Procedure(s) with comments: MICRO LUMBAR DECOMPRESSION L4-5 (N/A) - Bilateral Decompression L4-L5  Patient Location: PACU  Anesthesia Type:General  Level of Consciousness: awake, alert  and oriented  Airway & Oxygen Therapy: Patient Spontanous Breathing and Patient connected to face mask oxygen  Post-op Assessment: Report given to PACU RN and Post -op Vital signs reviewed and stable  Post vital signs: Reviewed and stable  Complications: No apparent anesthesia complications

## 2013-01-31 NOTE — Anesthesia Postprocedure Evaluation (Signed)
  Anesthesia Post-op Note  Patient: Virginia [name]  Procedure(s) Performed: Procedure(s) (LRB): MICRO LUMBAR DECOMPRESSION L4-5 (N/A)  Patient Location: PACU  Anesthesia Type: General  Level of Consciousness: awake and alert   Airway and Oxygen Therapy: Patient Spontanous Breathing  Post-op Pain: mild  Post-op Assessment: Post-op Vital signs reviewed, Patient's Cardiovascular Status Stable, Respiratory Function Stable, Patent Airway and No signs of Nausea or vomiting  Last Vitals:  Filed Vitals:   01/31/13 1330  BP: 173/68  Pulse: 79  Temp: 36.7 C  Resp: 15    Post-op Vital Signs: stable   Complications: No apparent anesthesia complications

## 2013-01-31 NOTE — Anesthesia Preprocedure Evaluation (Addendum)
Anesthesia Evaluation  Patient identified by MRN, date of birth, ID band Patient awake    Reviewed: Allergy & Precautions, H&P , NPO status , Patient's Chart, lab work & pertinent test results  Airway Mallampati: II TM Distance: >3 FB Neck ROM: Full    Dental no notable dental hx.    Pulmonary neg pulmonary ROS,  breath sounds clear to auscultation  Pulmonary exam normal       Cardiovascular hypertension, Pt. on medications negative cardio ROS  Rhythm:Regular Rate:Normal     Neuro/Psych CVA, Residual Symptoms negative neurological ROS  negative psych ROS   GI/Hepatic negative GI ROS, Neg liver ROS,   Endo/Other  negative endocrine ROSdiabetes, Insulin Dependent and Oral Hypoglycemic AgentsMorbid obesity  Renal/GU negative Renal ROS  negative genitourinary   Musculoskeletal negative musculoskeletal ROS (+)   Abdominal   Peds negative pediatric ROS (+)  Hematology negative hematology ROS (+)   Anesthesia Other Findings   Reproductive/Obstetrics negative OB ROS                          Anesthesia Physical Anesthesia Plan  ASA: III  Anesthesia Plan: General   Post-op Pain Management:    Induction: Intravenous  Airway Management Planned: Oral ETT  Additional Equipment:   Intra-op Plan:   Post-operative Plan: Extubation in OR  Informed Consent: I have reviewed the patients History and Physical, chart, labs and discussed the procedure including the risks, benefits and alternatives for the proposed anesthesia with the patient or authorized representative who has indicated his/her understanding and acceptance.   Dental advisory given  Plan Discussed with: CRNA  Anesthesia Plan Comments:         Anesthesia Quick Evaluation

## 2013-02-01 ENCOUNTER — Encounter (HOSPITAL_COMMUNITY): Payer: Self-pay | Admitting: Specialist

## 2013-02-01 LAB — GLUCOSE, CAPILLARY: Glucose-Capillary: 144 mg/dL — ABNORMAL HIGH (ref 70–99)

## 2013-02-01 LAB — BASIC METABOLIC PANEL
BUN: 7 mg/dL (ref 6–23)
CO2: 28 mEq/L (ref 19–32)
Chloride: 105 mEq/L (ref 96–112)
Creatinine, Ser: 0.72 mg/dL (ref 0.50–1.10)
Glucose, Bld: 147 mg/dL — ABNORMAL HIGH (ref 70–99)
Potassium: 4.5 mEq/L (ref 3.5–5.1)

## 2013-02-01 MED ORDER — ASPIRIN EC 81 MG PO TBEC: 81.0000 mg | DELAYED_RELEASE_TABLET | Freq: Every day | ORAL | Status: DC

## 2013-02-01 MED ORDER — METHOCARBAMOL 500 MG PO TABS: 500.0000 mg | ORAL_TABLET | Freq: Four times a day (QID) | ORAL | Status: DC | PRN

## 2013-02-01 NOTE — Evaluation (Signed)
Physical Therapy Evaluation Patient Details Name: Virginia [name] MRN: [id number] DOB: 01-Aug-1946 Today's Date: 02/01/2013 Time: 1191-4782 PT Time Calculation (min): 26 min  PT Assessment / Plan / Recommendation Clinical Impression  Pt s/p L4-5 decomp presents with limitations to functional mobility 2* post op pain and post op back precautions    PT Assessment  Patent does not need any further PT services    Follow Up Recommendations  No PT follow up    Does the patient have the potential to tolerate intense rehabilitation      Barriers to Discharge        Equipment Recommendations  None recommended by PT    Recommendations for Other Services OT consult   Frequency      Precautions / Restrictions Precautions Precautions: Back Precaution Booklet Issued: Yes (comment) Restrictions Weight Bearing Restrictions: No   Pertinent Vitals/Pain 4-5/10; premedicated      Mobility  Bed Mobility Bed Mobility: Rolling Left;Supine to Sit Rolling Left: 5: Supervision;4: Min guard Supine to Sit: 5: Supervision;4: Min guard Details for Bed Mobility Assistance: min cues to complete roll prior to move to sitting Transfers Transfers: Sit to Stand;Stand to Sit Sit to Stand: 5: Supervision Stand to Sit: 5: Supervision Details for Transfer Assistance: min cues for use of UEs and minimizing fwd lean Ambulation/Gait Ambulation/Gait Assistance: 4: Min guard;5: Supervision Ambulation Distance (Feet): 350 Feet Ambulation/Gait Assistance Details: min cues for pacing Gait Pattern: Step-through pattern Stairs:  (Reviewed verbally, pt sequencing correctly prior to surg)    Exercises     PT Diagnosis:    PT Problem List:   PT Treatment Interventions:     PT Goals    Visit Information  Last PT Received On: 02/01/13 Assistance Needed: +1    Subjective Data  Subjective: I've gotten up over night to the bathroom  Patient Stated Goal: Resume previous lifestyle with decreased pain    Prior Functioning  Home Living Lives With: Spouse Available Help at Discharge: Family Type of Home: House Home Access: Stairs to enter Secretary/administrator of Steps: 1+1 Entrance Stairs-Rails: None Home Layout: One level Additional Comments: Pt states she can borrow equipment including RW from sister as needed Prior Function Level of Independence: Independent Able to Take Stairs?: Yes Driving: Yes Vocation: Retired Musician: No difficulties Dominant Hand: Right    Cognition  Cognition Overall Cognitive Status: Appears within functional limits for tasks assessed/performed Arousal/Alertness: Awake/alert Orientation Level: Appears intact for tasks assessed Behavior During Session: [facility] for tasks performed    Extremity/Trunk Assessment Right Upper Extremity Assessment RUE ROM/Strength/Tone: [facility] for tasks assessed Left Upper Extremity Assessment LUE ROM/Strength/Tone: [facility] for tasks assessed Right Lower Extremity Assessment RLE ROM/Strength/Tone: [facility] for tasks assessed Left Lower Extremity Assessment LLE ROM/Strength/Tone: [facility] for tasks assessed   Balance    End of Session PT - End of Session Activity Tolerance: Patient tolerated treatment well Patient left: in chair;with call bell/phone within reach;with family/visitor present Nurse Communication: Mobility status  GP Functional Assessment Tool Used: clinical judgement Functional Limitation: Mobility: Walking and moving around Mobility: Walking and Moving Around Current Status (N5621): At least 1 percent but less than 20 percent impaired, limited or restricted Mobility: Walking and Moving Around Goal Status [phone]): At least 1 percent but less than 20 percent impaired, limited or restricted Mobility: Walking and Moving Around Discharge Status [phone]): At least 1 percent but less than 20 percent impaired, limited or restricted   [facility] 02/01/2013, 8:35 AM

## 2013-02-01 NOTE — Care Management Note (Signed)
    Page 1 of 1   02/01/2013     11:36:22 AM   CARE MANAGEMENT NOTE 02/01/2013  Patient:  LEDA, BELLEFEUILLE   Account Number:  [id number]  Date Initiated:  02/01/2013  Documentation initiated by:  Colleen Can  Subjective/Objective Assessment:   dx spinal stenosis L4-5; decompression laminotomies     Action/Plan:   CM spoke with patient. Plans are for her to return to her home in [place] [facility] [place] where spouse will be caregiver. She has no DME needs or HH needs   Anticipated DC Date:  02/01/2013   Anticipated DC Plan:  HOME/SELF CARE  In-house referral  Clinical Social Worker      DC Planning Services  CM consult      Choice offered to / List presented to:             Status of service:  Completed, signed off Medicare Important Message given?  NO (If response is "NO", the following Medicare IM given date fields will be blank) Date Medicare IM given:   Date Additional Medicare IM given:    Discharge Disposition:  HOME/SELF CARE  Per UR Regulation:  Reviewed for med. necessity/level of care/duration of stay  If discussed at Long Length of Stay Meetings, dates discussed:    Comments:

## 2013-02-01 NOTE — Evaluation (Signed)
Occupational Therapy Evaluation Patient Details Name: Virginia [name] MRN: [id number] DOB: 1946/03/16 Today's Date: 02/01/2013 Time: 5621-3086 OT Time Calculation (min): 28 min  OT Assessment / Plan / Recommendation Clinical Impression  Pt presents to OT s/p back surgery. Pt educated in back precautions with ADL activity , but will benefit from [facility] to address ADL activity in her home to increase I and decrease burden on her husband    OT Assessment  All further OT needs can be met in the next venue of care    Follow Up Recommendations  Home health OT                Precautions / Restrictions Precautions Precautions: Back Precaution Booklet Issued: Yes (comment) Restrictions Weight Bearing Restrictions: No       ADL  Grooming: Performed Where Assessed - Grooming: Unsupported standing Upper Body Bathing: Simulated;Set up Where Assessed - Upper Body Bathing: Unsupported sitting Lower Body Bathing: Simulated;Minimal assistance Where Assessed - Lower Body Bathing: Unsupported sit to stand Upper Body Dressing: Performed;Set up Where Assessed - Upper Body Dressing: Unsupported sitting Lower Body Dressing: Performed;Minimal assistance;Other (comment) (educated in AE) Where Assessed - Lower Body Dressing: Unsupported sit to stand Toilet Transfer: Performed;Min guard Statistician Method: Sit to Barista: Comfort height toilet;Grab bars Toileting - Clothing Manipulation and Hygiene: Performed;Min guard Where Assessed - Engineer, mining and Hygiene: Standing Equipment Used: Reacher;Sock aid Transfers/Ambulation Related to ADLs: husband will obtain sock aid            Visit Information  Last OT Received On: 02/01/13 Assistance Needed: +1    Subjective Data  Subjective: My husband is going to help me.   Prior Functioning     Home Living Lives With: Spouse Available Help at Discharge: Family Type of Home: House Home Access:  Stairs to enter Secretary/administrator of Steps: 1+1 Entrance Stairs-Rails: None Home Layout: One level Additional Comments: Pt states she can borrow equipment including RW from sister as needed. Husband will obtain AE Prior Function Level of Independence: Independent Able to Take Stairs?: Yes Driving: Yes Vocation: Retired Musician: No difficulties Dominant Hand: Right         Vision/Perception Vision - History Patient Visual Report: No change from baseline   Cognition  Cognition Overall Cognitive Status: Appears within functional limits for tasks assessed/performed Arousal/Alertness: Awake/alert Orientation Level: Appears intact for tasks assessed Behavior During Session: [facility] for tasks performed    Extremity/Trunk Assessment Right Upper Extremity Assessment RUE ROM/Strength/Tone: [facility] for tasks assessed Left Upper Extremity Assessment LUE ROM/Strength/Tone: WFL for tasks assessed Right Lower Extremity Assessment RLE ROM/Strength/Tone: [facility] for tasks assessed Left Lower Extremity Assessment LLE ROM/Strength/Tone: WFL for tasks assessed     Mobility Bed Mobility Bed Mobility: Rolling Left;Supine to Sit Rolling Left: 5: Supervision;4: Min guard Supine to Sit: 5: Supervision;4: Min guard Details for Bed Mobility Assistance: min cues to complete roll prior to move to sitting Transfers Sit to Stand: 5: Supervision Stand to Sit: 5: Supervision Details for Transfer Assistance: min cues for use of UEs and minimizing fwd lean           End of Session OT - End of Session Activity Tolerance: Patient tolerated treatment well Patient left: in bed;with family/visitor present  GO Functional Assessment Tool Used: clinical observation Functional Limitation: Self care Self Care Current Status (V7846): At least 20 percent but less than 40 percent impaired, limited or restricted Self Care Goal Status (N6295): At least 20 percent  but less than 40 percent  impaired, limited or restricted Self Care Discharge Status [phone]): At least 20 percent but less than 40 percent impaired, limited or restricted   REDDING, Metro Kung 02/01/2013, 10:05 AM

## 2013-02-01 NOTE — Progress Notes (Signed)
Subjective: 1 Day Post-Op Procedure(s) (LRB): MICRO LUMBAR DECOMPRESSION L4-5 (N/A) Patient reports pain as mild.  Notes incisional midline lower back pain. Legs feeling achy but radicular leg pain improved from pre-op. Feels muscle relaxer is really helping with her back pain. Denies numbness, tingling. Has been OOB to bathroom and walking in halls this AM without difficulty. Feeling ready for D/C home. Voiding without difficulty. Seen in AM rounds by myself and Dr. Shelle Iron  Objective: Vital signs in last 24 hours: Temp:  [97.5 F (36.4 C)-99.4 F (37.4 C)] 99.4 F (37.4 C) (03/06 0545) Pulse Rate:  [64-89] 68 (03/06 0902) Resp:  [15-21] 16 (03/06 0902) BP: (135-173)/(61-85) 135/73 mmHg (03/06 0902) SpO2:  [92 %-100 %] 97 % (03/06 0902) Weight:  [100.245 kg (221 lb)] 100.245 kg (221 lb) (03/05 1130)  Intake/Output from previous day: 03/05 0701 - 03/06 0700 In: 2540 [P.O.:240; I.V.:2250; IV Piggyback:50] Out: 2500 [Urine:2500] Intake/Output this shift: Total I/O In: 240 [P.O.:240] Out: -   No results found for this basename: HGB,  in the last 72 hours No results found for this basename: WBC, RBC, HCT, PLT,  in the last 72 hours  Recent Labs  02/01/13 0425  NA 138  K 4.5  CL 105  CO2 28  BUN 7  CREATININE 0.72  GLUCOSE 147*  CALCIUM 8.8   No results found for this basename: LABPT, INR,  in the last 72 hours  Neurologically intact Neurovascular intact Sensation intact distally Intact pulses distally Dorsiflexion/Plantar flexion intact Incision: dressing C/D/I and no drainage No cellulitis present Compartment soft No calf pain, negative Homan's, no sign of DVT Aquacel dressing in place midline Lspine no drainage  Assessment/Plan: 1 Day Post-Op Procedure(s) (LRB): MICRO LUMBAR DECOMPRESSION L4-5 (N/A) Advance diet Up with therapy D/C IV fluids D/C home today Reviewed D/C instructions Follow up 10-14 days for suture removal  BISSELL, JACLYN M. 02/01/2013,  10:05 AM

## 2013-02-02 NOTE — Discharge Summary (Signed)
Physician Discharge Summary   Patient ID: Virginia [name] MRN: [id number] DOB/AGE: 66-Aug-1947 66 y.o.  Admit date: 01/31/2013 Discharge date: 02/02/2013  Primary Diagnosis:   STENOSIS L4-5  Admission Diagnoses:  Past Medical History  Diagnosis Date  . Hypertension   . Stroke     behind rt eye / visual defficiet rt eye  . Spinal stenosis of lumbar region   . Arthritis   . Diabetes mellitus without complication   . Difficulty sleeping     due to back pain   Discharge Diagnoses:   Principal Problem:   Spinal stenosis, lumbar region, with neurogenic claudication  Procedure:  Procedure(s) (LRB): MICRO LUMBAR DECOMPRESSION L4-5 (N/A)   Consults: None  HPI:  see H&P    Laboratory Data: Hospital Outpatient Visit on 01/26/2013  Component Date Value Range Status  . Glucose, Bld 01/26/2013 214* 70 - 99 mg/dL Final   No results found for this basename: HGB,  in the last 72 hours No results found for this basename: WBC, RBC, HCT, PLT,  in the last 72 hours  Recent Labs  02/01/13 0425  NA 138  K 4.5  CL 105  CO2 28  BUN 7  CREATININE 0.72  GLUCOSE 147*  CALCIUM 8.8   No results found for this basename: LABPT, INR,  in the last 72 hours  X-Rays:Dg Chest 2 View  01/25/2013  *RADIOLOGY REPORT*  Clinical Data: Preop for lumbar decompression.  CHEST - 2 VIEW  Comparison: 07/14/2008.  Findings: Heart is borderline in size but stable.  The mediastinal and hilar contours are unchanged.  The lungs are clear.  No pleural effusion.  Stable mild eventration of the right hemidiaphragm.  The bony structures are intact.  Stable cervical spine fusion hardware.  IMPRESSION: No acute cardiopulmonary findings.  No interval change since prior study from 2009.   Original Report Authenticated By: Rudie Meyer, M.D.    Dg Lumbar Spine 2-3 Views  01/25/2013  *RADIOLOGY REPORT*  Clinical Data: Preop for lumbar surgery.  LUMBAR SPINE - 2-3 VIEW  Comparison: None.  Findings: There are five non-rib  bearing lumbar type vertebral bodies.  The alignment is normal.  Disc spaces are fairly well preserved.  Lower lumbar facet degenerative changes are noted without obvious pars defect.  The visualized bony pelvis is intact. A small calcified splenic artery aneurysm is noted.  IMPRESSION:  Normal alignment and no acute bony findings.   Original Report Authenticated By: Rudie Meyer, M.D.    Dg Spine Portable 1 View  01/31/2013  *RADIOLOGY REPORT*  Clinical Data: Surgical level L4-5.  PORTABLE SPINE - 1 VIEW  Comparison: 01/31/2013.  Preoperative imaging 01/25/2013  Findings: Posterior surgical instruments are directed at the L4 and L5 vertebral bodies.  IMPRESSION: Intraoperative localization as above.   Original Report Authenticated By: Charlett Nose, M.D.    Dg Spine Portable 1 View  01/31/2013  *RADIOLOGY REPORT*  Clinical Data: Intraoperative evaluation.  PORTABLE SPINE - 1 VIEW  Comparison: 01/31/2013.  Findings: A single lateral intraoperative view of the lumbar spine demonstrates soft tissue retractors in place posterior to the L4-L5 interspace, with a surgical probe in place posterior to L4-L5 space with the tip at the level of the posterior aspect of the facet joint.  IMPRESSION: 1.  Intraoperative localization, as above.   Original Report Authenticated By: Trudie Reed, M.D.    Dg Spine Portable 1 View  01/31/2013  *RADIOLOGY REPORT*  Clinical Data: l the patient.  PORTABLE SPINE - 1 VIEW  Comparison: 27 14  Findings: The same numbering scheme is used on today's exam as was employed on the study from 01/25/2013.  Two spinal needles are seen within the posterior soft tissues of the lower back.  The more caudal of the two needles overlies the soft tissues just posterior to the L4-5 interspinous space.  The more cranial of the two needles overlies the soft tissues just posterior to the space between the L3-4 spinous processes.  IMPRESSION: Intraoperative localization.   Original Report Authenticated By:  Kennith Center, M.D.     EKG: Orders placed during the hospital encounter of 01/25/13  . EKG 12-LEAD  . EKG 12-LEAD     Hospital Course: Patient was admitted to [facility] and taken to the OR and underwent the above state procedure without complications.  Patient tolerated the procedure well and was later transferred to the recovery room and then to the orthopaedic floor for postoperative care.  They were given PO and IV analgesics for pain control following their surgery.  They were given 24 hours of postoperative antibiotics.   PT was consulted postop to assist with mobility and transfers.  The patient was allowed to be WBAT with therapy and was taught back precautions. Discharge planning was consulted to help with postop disposition and equipment needs.  Patient had a fair night on the evening of surgery and started to get up OOB with therapy on day one. Patient was seen in rounds and was ready to go home on day one.  They were given discharge instructions and dressing directions.  They were instructed on when to follow up in the office with Dr. Shelle Iron.  Discharge Medications: Prior to Admission medications   Medication Sig Start Date End Date Taking? Authorizing Provider  citalopram (CELEXA) 20 MG tablet Take 20 mg by mouth at bedtime.   Yes Historical Provider, MD  gabapentin (NEURONTIN) 300 MG capsule Take 300-600 mg by mouth 3 (three) times daily.   Yes Historical Provider, MD  glipiZIDE (GLUCOTROL XL) 10 MG 24 hr tablet Take 10 mg by mouth 2 (two) times daily.   Yes Historical Provider, MD  insulin glargine (LANTUS) 100 UNIT/ML injection Inject 50 Units into the skin 2 (two) times daily.   Yes Historical Provider, MD  insulin lispro (HUMALOG) 100 UNIT/ML injection Inject 10 Units into the skin 3 (three) times daily before meals.   Yes Historical Provider, MD  acetaminophen (TYLENOL) 500 MG tablet Take 1,000 mg by mouth every 6 (six) hours as needed for pain.    Historical Provider,  MD  aspirin EC 81 MG tablet Take 1 tablet (81 mg total) by mouth daily. 02/01/13   Dayna Barker. Akaya Proffit, PA-C  ibuprofen (ADVIL,MOTRIN) 200 MG tablet Take 600 mg by mouth every 6 (six) hours as needed for pain.    Historical Provider, MD  lisinopril (PRINIVIL,ZESTRIL) 10 MG tablet Take 10 mg by mouth daily.    Historical Provider, MD  methocarbamol (ROBAXIN) 500 MG tablet Take 1 tablet (500 mg total) by mouth every 6 (six) hours as needed (pain/spasms). 02/01/13   Dayna Barker. Valia Wingard, PA-C  oxyCODONE-acetaminophen (ROXICET) 5-325 MG per tablet Take 1-2 tablets by mouth every 4 (four) hours as needed for pain. 01/31/13   Dayna Barker. Christene Lye, PA-C    Diet: Diabetic diet Activity:WBAT Follow-up:in 10-14 days Disposition - Home Discharged Condition: good   Discharge Orders   Future Orders Complete By Expires     Call MD / Call 911  As directed  Comments:      If you experience chest pain or shortness of breath, CALL 911 and be transported to the hospital emergency room.  If you develope a fever above 101 F, pus (white drainage) or increased drainage or redness at the wound, or calf pain, call your surgeon's office.    Constipation Prevention  As directed     Comments:      Drink plenty of fluids.  Prune juice may be helpful.  You may use a stool softener, such as Colace (over the counter) 100 mg twice a day.  Use MiraLax (over the counter) for constipation as needed.    Diet - low sodium heart healthy  As directed     Increase activity slowly as tolerated  As directed         Medication List    STOP taking these medications       HYDROcodone-acetaminophen 10-325 MG per tablet  Commonly known as:  NORCO      TAKE these medications       acetaminophen 500 MG tablet  Commonly known as:  TYLENOL  Take 1,000 mg by mouth every 6 (six) hours as needed for pain.     aspirin EC 81 MG tablet  Take 1 tablet (81 mg total) by mouth daily.     citalopram 20 MG tablet  Commonly known as:  CELEXA    Take 20 mg by mouth at bedtime.     gabapentin 300 MG capsule  Commonly known as:  NEURONTIN  Take 300-600 mg by mouth 3 (three) times daily.     glipiZIDE 10 MG 24 hr tablet  Commonly known as:  GLUCOTROL XL  Take 10 mg by mouth 2 (two) times daily.     ibuprofen 200 MG tablet  Commonly known as:  ADVIL,MOTRIN  Take 600 mg by mouth every 6 (six) hours as needed for pain.     insulin glargine 100 UNIT/ML injection  Commonly known as:  LANTUS  Inject 50 Units into the skin 2 (two) times daily.     insulin lispro 100 UNIT/ML injection  Commonly known as:  HUMALOG  Inject 10 Units into the skin 3 (three) times daily before meals.     lisinopril 10 MG tablet  Commonly known as:  PRINIVIL,ZESTRIL  Take 10 mg by mouth daily.     methocarbamol 500 MG tablet  Commonly known as:  ROBAXIN  Take 1 tablet (500 mg total) by mouth every 6 (six) hours as needed (pain/spasms).     oxyCODONE-acetaminophen 5-325 MG per tablet  Commonly known as:  ROXICET  Take 1-2 tablets by mouth every 4 (four) hours as needed for pain.           Follow-up Information   Follow up with BEANE,JEFFREY C, MD In 10 days.   Contact information:   [address] [place] [address] [phone]       Signed: Dorothy Spark. 02/02/2013, 11:40 AM

## 2013-02-19 ENCOUNTER — Other Ambulatory Visit (HOSPITAL_COMMUNITY): Payer: Self-pay | Admitting: Specialist

## 2013-02-19 DIAGNOSIS — R609 Edema, unspecified: Secondary | ICD-10-CM

## 2013-02-20 ENCOUNTER — Ambulatory Visit (HOSPITAL_COMMUNITY)
Admission: RE | Admit: 2013-02-20 | Discharge: 2013-02-20 | Disposition: A | Discharge status: Home / Self Care | Payer: Medicare Other | Source: Ambulatory Visit | Attending: Cardiovascular Disease | Admitting: Cardiovascular Disease

## 2013-02-20 DIAGNOSIS — R609 Edema, unspecified: Secondary | ICD-10-CM | POA: Insufficient documentation

## 2013-02-20 NOTE — Progress Notes (Signed)
Left Lower Ext Venous Duplex Completed. Negative for DVT. Elliotte Marsalis D    

## 2013-04-03 ENCOUNTER — Other Ambulatory Visit: Payer: Self-pay | Admitting: Orthopedic Surgery

## 2013-04-03 ENCOUNTER — Encounter (HOSPITAL_COMMUNITY): Payer: Self-pay

## 2013-04-05 ENCOUNTER — Encounter (HOSPITAL_COMMUNITY): Payer: Self-pay

## 2013-04-05 ENCOUNTER — Encounter (HOSPITAL_COMMUNITY)
Admission: RE | Admit: 2013-04-05 | Discharge: 2013-04-05 | Disposition: A | Discharge status: Home / Self Care | Payer: Medicare Other | Source: Ambulatory Visit | Attending: Orthopedic Surgery | Admitting: Orthopedic Surgery

## 2013-04-05 ENCOUNTER — Encounter (HOSPITAL_COMMUNITY)
Admission: RE | Admit: 2013-04-05 | Discharge: 2013-04-05 | Disposition: A | Discharge status: Home / Self Care | Payer: Medicare Other | Source: Ambulatory Visit | Attending: Specialist | Admitting: Specialist

## 2013-04-05 DIAGNOSIS — Z01818 Encounter for other preprocedural examination: Secondary | ICD-10-CM | POA: Insufficient documentation

## 2013-04-05 DIAGNOSIS — Z01812 Encounter for preprocedural laboratory examination: Secondary | ICD-10-CM | POA: Insufficient documentation

## 2013-04-05 HISTORY — DX: Personal history of other medical treatment: Z92.89

## 2013-04-05 LAB — CBC
HCT: 38.1 % (ref 36.0–46.0)
MCV: 88.4 fL (ref 78.0–100.0)
RBC: 4.31 MIL/uL (ref 3.87–5.11)
WBC: 9.6 10*3/uL (ref 4.0–10.5)

## 2013-04-05 LAB — BASIC METABOLIC PANEL
CO2: 30 mEq/L (ref 19–32)
Chloride: 101 mEq/L (ref 96–112)
Creatinine, Ser: 0.71 mg/dL (ref 0.50–1.10)

## 2013-04-05 LAB — SURGICAL PCR SCREEN: MRSA, PCR: POSITIVE — AB

## 2013-04-05 NOTE — Pre-Procedure Instructions (Signed)
Virginia [name]  04/05/2013   Your procedure is scheduled on:  Thursday, May 15th  Report to [facility] at 1100 AM.  Call this number if you have problems the morning of surgery: [phone]   Remember:   Do not eat food or drink liquids after midnight.    Take these medicines the morning of surgery with A SIP OF WATER: Neurontin, percocet if needed   Do not wear jewelry, make-up or nail polish.  Do not wear lotions, powders, or perfumes, deodorant.  Do not shave 48 hours prior to surgery.   Do not bring valuables to the hospital.  Contacts, dentures or bridgework may not be worn into surgery.  Leave suitcase in the car. After surgery it may be brought to your room.  For patients admitted to the hospital, checkout time is 11:00 AM the day of discharge.   Patients discharged the day of surgery will not be allowed to drive home.   Special Instructions: Shower using CHG 2 nights before surgery and the night before surgery.  If you shower the day of surgery use CHG.  Use special wash - you have one bottle of CHG for all showers.  You should use approximately 1/3 of the bottle for each shower.   Please read over the following fact sheets that you were given: Pain Booklet, Coughing and Deep Breathing, MRSA Information and Surgical Site Infection Prevention

## 2013-04-05 NOTE — Progress Notes (Signed)
Primary Physician - Dr. Dimas Aguas No cardiologist  ekg in epic no other cardiac testing

## 2013-04-05 NOTE — Progress Notes (Signed)
04/05/13 1014  OBSTRUCTIVE SLEEP APNEA  Have you ever been diagnosed with sleep apnea through a sleep study? No  Do you snore loudly (loud enough to be heard through closed doors)?  1  Do you often feel tired, fatigued, or sleepy during the daytime? 0  Has anyone observed you stop breathing during your sleep? 0  Do you have, or are you being treated for high blood pressure? 1  BMI more than 35 kg/m2? 1  Age over 66 years old? 1  Neck circumference greater than 40 cm/18 inches? 0  Gender: 0  Obstructive Sleep Apnea Score 4  Score 4 or greater  Results sent to PCP

## 2013-04-11 ENCOUNTER — Other Ambulatory Visit: Payer: Self-pay | Admitting: Orthopedic Surgery

## 2013-04-11 MED ORDER — CEFAZOLIN SODIUM-DEXTROSE 2-3 GM-% IV SOLR
2.0000 g | INTRAVENOUS | Status: AC
  Administered 2013-04-12 (×2): 2 g via INTRAVENOUS
  Filled 2013-04-11: qty 50

## 2013-04-11 MED ORDER — CHLORHEXIDINE GLUCONATE 4 % EX LIQD: 60.0000 mL | Freq: Once | CUTANEOUS | Status: DC

## 2013-04-11 NOTE — H&P (Signed)
Virginia [name] is an 67 y.o. female.   Chief Complaint: back and B/L leg pain HPI: The patient is a 67 year old female now 10 weeks out from lumbar decompression (Bilateral hemilaminectomy and lateral recess decompression L4-5, foraminotomies L4 and L5). The first 2 weeks post-operatively she had zero leg pain. Her pre-op pain had been all right sided. 2 weeks post-op she presented with contralateral, left sided pain, and now reports recurrence of her right sided pain. Reports continued severe pain (bilateral lower extremeties), weakness, numbness (and tingling), burning, and pain with sitting. The following medication has been used for pain control: Hydromorphone 4mg , Meloxicam, Neurontin and Percocet prn. The patient reports their current pain level to be severe and 10 / 10. Pain refractory to narcotic and non-narcotic analgesics, NSAIDs, Neurontin, HEP, activity modification, relative rest.    Past Medical History  Diagnosis Date  . Hypertension   . Stroke     behind rt eye / visual defficiet rt eye  . Spinal stenosis of lumbar region   . Arthritis   . Difficulty sleeping     due to back pain  . Diabetes mellitus without complication     fasting 120-130s  . History of blood transfusion     Past Surgical History  Procedure Laterality Date  . Cholecystectomy    . Abdominal hysterectomy    . Oophorectomy    . Bladder surgery      bladder tack  . Cervical spine surgery      cervical fusion  . Lumbar laminectomy/decompression microdiscectomy N/A 01/31/2013    Procedure: MICRO LUMBAR DECOMPRESSION L4-5;  Surgeon: Javier Docker, MD;  Location: [facility];  Service: Orthopedics;  Laterality: N/A;  Bilateral Decompression L4-L5    No family history on file. Social History:  reports that she has never smoked. She does not have any smokeless tobacco history on file. She reports that she does not drink alcohol or use illicit drugs.  Allergies:  Allergies  Allergen Reactions  . Shellfish  Allergy Anaphylaxis     (Not in a hospital admission)  No results found for this or any previous visit (from the past 48 hour(s)). No results found.  Review of Systems  Constitutional: Negative.   HENT: Negative.   Eyes: Negative.   Respiratory: Negative.   Cardiovascular: Negative.   Gastrointestinal: Negative.   Genitourinary: Negative.   Musculoskeletal: Positive for back pain.  Skin: Negative.   Neurological: Positive for speech change and focal weakness.  Endo/Heme/Allergies: Negative.   Psychiatric/Behavioral: Negative.     There were no vitals taken for this visit. Physical Exam  Constitutional: She is oriented to person, place, and time. She appears well-developed and well-nourished.  HENT:  Head: Normocephalic and atraumatic.  Eyes: Conjunctivae and EOM are normal. Pupils are equal, round, and reactive to light.  Neck: Normal range of motion. Neck supple.  Cardiovascular: Normal rate and regular rhythm.   Respiratory: Effort normal and breath sounds normal.  GI: Soft. Bowel sounds are normal.  Musculoskeletal:  On exam, she's upright, in minimal distress. SLR produces buttock and thigh pain. She has pain more when she stands than when she sits. Pain with extension.  Lumbar spine exam reveals no evidence of soft tissue swelling, ecchymosis or deformity. The abdomen is soft and nontender. Nontender over the trochanters. No cellulitis or lymphadenopathy.  Motor is 5/5 including EHL, tibialis anterior, plantar flexion, quadriceps and hamstrings. Patient is normoreflexic. There is no Babinski or clonus. Sensory exam is intact to  light touch. The patient has good distal pulses. No DVT. No pain and normal range of motion without instability of the hips, knees and ankles.   Neurological: She is alert and oriented to person, place, and time. She has normal reflexes.  Skin: Skin is warm and dry.  Psychiatric: She has a normal mood and affect.     She's had her MRI  which indicates lateral recess stenosis, epidural fibrosis. Her MRI demonstrates decompression, no complications. There is some lateral recess stenosis that is moderate bilaterally. No change in her slight listhesis. EMG/NCV with persistent left L5 radiculopathy with increased insertional activity noted in the biceps femoris long head as well as the lumbar paraspinal muscles. Paraspinal muscle findings could be related to the recent surgery 01-31-13.  Assessment/Plan Spinal stenosis and spondylolisthesis L4-5  We discussed options in extensive detail. We discussed decompression of the contralateral side. My concern is that it has now returned on the right. My concern is, to perform an aggressive decompression, she would be at increased risk for a slip. She has a spondylolisthesis at this point. She would be best, given her size, to perform instrumentation with lateral mass fusion. I wouldn't do an interbody because she has a very large disc space.I had an extensive discussion of the risks and benefits of the lumbar decompression with the patient including bleeding, infection, damage to neurovascular structures, epidural fibrosis, CSF leak requiring repair. We also discussed increase in pain, adjacent segment disease, recurrent disc herniation, need for future surgery including repeat decompression and/or fusion. We also discussed risks of postoperative hematoma, paralysis, anesthetic complications including DVT, PE, death, cardiopulmonary dysfunction. In addition, the perioperative and postoperative courses were discussed in detail including the rehabilitative time and return to functional activity and work. I provided the patient with an illustrated handout and utilized the appropriate surgical models. [facility] will do it at [facility]. We discussed this with Dr. Shon Baton. He agreed that a concomitant fusion is required. We will have this scheduled. Continue with pain management in the interim. We had a long  discussion concerning that. We did discuss a decompression, as well, with her but I do think this would be appropriate in terms of avoiding any further slip, given her postoperative course.  Plan: lumbar decompression L4-5, pedicle instrumentation, lateral mass fusion, allograft and autograft bone graft   BISSELL, JACLYN M. for Dr. Shelle Iron 04/11/2013, 4:52 PM

## 2013-04-12 ENCOUNTER — Inpatient Hospital Stay (HOSPITAL_COMMUNITY): Payer: Medicare Other | Admitting: Anesthesiology

## 2013-04-12 ENCOUNTER — Inpatient Hospital Stay (HOSPITAL_COMMUNITY): Payer: Medicare Other

## 2013-04-12 ENCOUNTER — Encounter (HOSPITAL_COMMUNITY): Payer: Self-pay | Admitting: Anesthesiology

## 2013-04-12 ENCOUNTER — Encounter (HOSPITAL_COMMUNITY)
Admission: RE | Disposition: A | Discharge status: Home Health | Payer: Self-pay | Source: Ambulatory Visit | Attending: Specialist

## 2013-04-12 ENCOUNTER — Inpatient Hospital Stay (HOSPITAL_COMMUNITY)
Admission: RE | Admit: 2013-04-12 | Discharge: 2013-04-15 | DRG: 460 | Disposition: A | Discharge status: Home Health | Payer: Medicare Other | Source: Ambulatory Visit | Attending: Specialist | Admitting: Specialist

## 2013-04-12 DIAGNOSIS — Z981 Arthrodesis status: Secondary | ICD-10-CM

## 2013-04-12 DIAGNOSIS — Z6841 Body Mass Index (BMI) 40.0 and over, adult: Secondary | ICD-10-CM

## 2013-04-12 DIAGNOSIS — Z91013 Allergy to seafood: Secondary | ICD-10-CM

## 2013-04-12 DIAGNOSIS — M4310 Spondylolisthesis, site unspecified: Principal | ICD-10-CM | POA: Diagnosis present

## 2013-04-12 DIAGNOSIS — Z01812 Encounter for preprocedural laboratory examination: Secondary | ICD-10-CM

## 2013-04-12 DIAGNOSIS — G96198 Other disorders of meninges, not elsewhere classified: Secondary | ICD-10-CM | POA: Diagnosis present

## 2013-04-12 DIAGNOSIS — I10 Essential (primary) hypertension: Secondary | ICD-10-CM | POA: Diagnosis present

## 2013-04-12 DIAGNOSIS — E119 Type 2 diabetes mellitus without complications: Secondary | ICD-10-CM | POA: Diagnosis present

## 2013-04-12 DIAGNOSIS — M48061 Spinal stenosis, lumbar region without neurogenic claudication: Secondary | ICD-10-CM

## 2013-04-12 DIAGNOSIS — Z8673 Personal history of transient ischemic attack (TIA), and cerebral infarction without residual deficits: Secondary | ICD-10-CM

## 2013-04-12 LAB — GLUCOSE, CAPILLARY
Glucose-Capillary: 114 mg/dL — ABNORMAL HIGH (ref 70–99)
Glucose-Capillary: 135 mg/dL — ABNORMAL HIGH (ref 70–99)
Glucose-Capillary: 206 mg/dL — ABNORMAL HIGH (ref 70–99)
Glucose-Capillary: 250 mg/dL — ABNORMAL HIGH (ref 70–99)

## 2013-04-12 LAB — POCT I-STAT 4, (NA,K, GLUC, HGB,HCT)
HCT: 31 % — ABNORMAL LOW (ref 36.0–46.0)
Hemoglobin: 10.5 g/dL — ABNORMAL LOW (ref 12.0–15.0)
Potassium: 4.4 mEq/L (ref 3.5–5.1)
Sodium: 136 mEq/L (ref 135–145)

## 2013-04-12 SURGERY — POSTERIOR LUMBAR FUSION 1 LEVEL
Anesthesia: General

## 2013-04-12 MED ORDER — SUCCINYLCHOLINE CHLORIDE 20 MG/ML IJ SOLN
INTRAMUSCULAR | Status: DC | PRN
  Administered 2013-04-12: 100 mg via INTRAVENOUS

## 2013-04-12 MED ORDER — INSULIN LISPRO 100 UNIT/ML ~~LOC~~ SOLN: 10.0000 [IU] | Freq: Three times a day (TID) | SUBCUTANEOUS | Status: DC

## 2013-04-12 MED ORDER — SODIUM CHLORIDE 0.45 % IV SOLN
INTRAVENOUS | Status: DC
  Administered 2013-04-13: 21:00:00 via INTRAVENOUS

## 2013-04-12 MED ORDER — SODIUM CHLORIDE 0.9 % IR SOLN
Status: DC | PRN
  Administered 2013-04-12: 18:00:00

## 2013-04-12 MED ORDER — ALBUMIN HUMAN 5 % IV SOLN
INTRAVENOUS | Status: DC | PRN
  Administered 2013-04-12 (×2): via INTRAVENOUS

## 2013-04-12 MED ORDER — SUFENTANIL CITRATE 50 MCG/ML IV SOLN
INTRAVENOUS | Status: DC | PRN
  Administered 2013-04-12: 25 ug via INTRAVENOUS
  Administered 2013-04-12: 15 ug via INTRAVENOUS

## 2013-04-12 MED ORDER — INSULIN ASPART 100 UNIT/ML ~~LOC~~ SOLN
10.0000 [IU] | Freq: Three times a day (TID) | SUBCUTANEOUS | Status: DC
  Administered 2013-04-13 – 2013-04-14 (×3): 10 [IU] via SUBCUTANEOUS

## 2013-04-12 MED ORDER — HYDROMORPHONE HCL PF 1 MG/ML IJ SOLN
INTRAMUSCULAR | Status: AC
  Filled 2013-04-12: qty 1

## 2013-04-12 MED ORDER — SENNOSIDES-DOCUSATE SODIUM 8.6-50 MG PO TABS
1.0000 | ORAL_TABLET | Freq: Every evening | ORAL | Status: DC | PRN
  Administered 2013-04-14: 1 via ORAL
  Filled 2013-04-12: qty 1

## 2013-04-12 MED ORDER — HEMOSTATIC AGENTS (NO CHARGE) OPTIME
TOPICAL | Status: DC | PRN
  Administered 2013-04-12: 3 via TOPICAL

## 2013-04-12 MED ORDER — MIDAZOLAM HCL 2 MG/2ML IJ SOLN: 0.5000 mg | Freq: Once | INTRAMUSCULAR | Status: DC | PRN

## 2013-04-12 MED ORDER — HYDROMORPHONE HCL 2 MG PO TABS
2.0000 mg | ORAL_TABLET | Freq: Four times a day (QID) | ORAL | Status: DC | PRN
  Administered 2013-04-13: 2 mg via ORAL
  Filled 2013-04-12: qty 1

## 2013-04-12 MED ORDER — GLYCOPYRROLATE 0.2 MG/ML IJ SOLN
INTRAMUSCULAR | Status: DC | PRN
  Administered 2013-04-12: 0.6 mg via INTRAVENOUS

## 2013-04-12 MED ORDER — CLINDAMYCIN PHOSPHATE 900 MG/50ML IV SOLN
900.0000 mg | Freq: Once | INTRAVENOUS | Status: AC
  Administered 2013-04-12: 900 mg via INTRAVENOUS
  Filled 2013-04-12: qty 50

## 2013-04-12 MED ORDER — LACTATED RINGERS IV SOLN
INTRAVENOUS | Status: DC
  Administered 2013-04-12: 12:00:00 via INTRAVENOUS

## 2013-04-12 MED ORDER — DEXTROSE 5 % IV SOLN
INTRAVENOUS | Status: DC | PRN
  Administered 2013-04-12: 14:00:00 via INTRAVENOUS

## 2013-04-12 MED ORDER — MENTHOL 3 MG MT LOZG: 1.0000 | LOZENGE | OROMUCOSAL | Status: DC | PRN

## 2013-04-12 MED ORDER — SODIUM CHLORIDE 0.9 % IV SOLN: 250.0000 mL | INTRAVENOUS | Status: DC

## 2013-04-12 MED ORDER — SODIUM CHLORIDE 0.9 % IJ SOLN
3.0000 mL | Freq: Two times a day (BID) | INTRAMUSCULAR | Status: DC
  Administered 2013-04-13 – 2013-04-14 (×3): 3 mL via INTRAVENOUS

## 2013-04-12 MED ORDER — CEFAZOLIN SODIUM 1-5 GM-% IV SOLN
INTRAVENOUS | Status: AC
  Filled 2013-04-12: qty 100

## 2013-04-12 MED ORDER — FLEET ENEMA 7-19 GM/118ML RE ENEM: 1.0000 | ENEMA | Freq: Once | RECTAL | Status: AC | PRN

## 2013-04-12 MED ORDER — ZOLPIDEM TARTRATE 5 MG PO TABS
5.0000 mg | ORAL_TABLET | Freq: Every evening | ORAL | Status: DC | PRN
  Administered 2013-04-13 – 2013-04-14 (×3): 5 mg via ORAL
  Filled 2013-04-12 (×3): qty 1

## 2013-04-12 MED ORDER — METHOCARBAMOL 500 MG PO TABS
ORAL_TABLET | ORAL | Status: AC
  Administered 2013-04-12: 500 mg
  Filled 2013-04-12: qty 1

## 2013-04-12 MED ORDER — NEOSTIGMINE METHYLSULFATE 1 MG/ML IJ SOLN
INTRAMUSCULAR | Status: DC | PRN
  Administered 2013-04-12: 5 mg via INTRAVENOUS

## 2013-04-12 MED ORDER — BUPIVACAINE-EPINEPHRINE PF 0.5-1:200000 % IJ SOLN
INTRAMUSCULAR | Status: AC
  Filled 2013-04-12: qty 30

## 2013-04-12 MED ORDER — ALUM & MAG HYDROXIDE-SIMETH 200-200-20 MG/5ML PO SUSP: 30.0000 mL | Freq: Four times a day (QID) | ORAL | Status: DC | PRN

## 2013-04-12 MED ORDER — INSULIN GLARGINE 100 UNIT/ML ~~LOC~~ SOLN
50.0000 [IU] | Freq: Two times a day (BID) | SUBCUTANEOUS | Status: DC
  Administered 2013-04-12 – 2013-04-14 (×4): 50 [IU] via SUBCUTANEOUS
  Filled 2013-04-12 (×8): qty 0.5

## 2013-04-12 MED ORDER — ONDANSETRON HCL 4 MG/2ML IJ SOLN
INTRAMUSCULAR | Status: DC | PRN
  Administered 2013-04-12: 2 mg via INTRAVENOUS
  Administered 2013-04-12: 4 mg via INTRAVENOUS

## 2013-04-12 MED ORDER — ROCURONIUM BROMIDE 100 MG/10ML IV SOLN
INTRAVENOUS | Status: DC | PRN
  Administered 2013-04-12: 50 mg via INTRAVENOUS

## 2013-04-12 MED ORDER — GABAPENTIN 300 MG PO CAPS
900.0000 mg | ORAL_CAPSULE | Freq: Three times a day (TID) | ORAL | Status: DC
  Administered 2013-04-13 – 2013-04-15 (×8): 900 mg via ORAL
  Filled 2013-04-12 (×10): qty 3

## 2013-04-12 MED ORDER — ACETAMINOPHEN 650 MG RE SUPP: 650.0000 mg | RECTAL | Status: DC | PRN

## 2013-04-12 MED ORDER — OXYCODONE HCL 5 MG PO TABS
ORAL_TABLET | ORAL | Status: AC
  Administered 2013-04-12: 5 mg
  Filled 2013-04-12: qty 1

## 2013-04-12 MED ORDER — THROMBIN 20000 UNITS EX SOLR
CUTANEOUS | Status: AC
  Filled 2013-04-12: qty 20000

## 2013-04-12 MED ORDER — CEFAZOLIN SODIUM-DEXTROSE 2-3 GM-% IV SOLR
2.0000 g | Freq: Three times a day (TID) | INTRAVENOUS | Status: AC
  Administered 2013-04-13 (×2): 2 g via INTRAVENOUS
  Filled 2013-04-12 (×2): qty 50

## 2013-04-12 MED ORDER — HYDROMORPHONE HCL PF 1 MG/ML IJ SOLN
0.5000 mg | INTRAMUSCULAR | Status: DC | PRN
  Administered 2013-04-12 – 2013-04-14 (×9): 1 mg via INTRAVENOUS
  Filled 2013-04-12 (×9): qty 1

## 2013-04-12 MED ORDER — METHOCARBAMOL 500 MG PO TABS
500.0000 mg | ORAL_TABLET | Freq: Four times a day (QID) | ORAL | Status: DC | PRN
  Administered 2013-04-13 – 2013-04-15 (×6): 500 mg via ORAL
  Filled 2013-04-12 (×7): qty 1

## 2013-04-12 MED ORDER — MIDAZOLAM HCL 5 MG/5ML IJ SOLN
INTRAMUSCULAR | Status: DC | PRN
  Administered 2013-04-12: 0.5 mg via INTRAVENOUS
  Administered 2013-04-12: 1 mg via INTRAVENOUS

## 2013-04-12 MED ORDER — LISINOPRIL 20 MG PO TABS
20.0000 mg | ORAL_TABLET | Freq: Every day | ORAL | Status: DC
  Administered 2013-04-13 – 2013-04-15 (×3): 20 mg via ORAL
  Filled 2013-04-12 (×3): qty 1

## 2013-04-12 MED ORDER — ACETAMINOPHEN 325 MG PO TABS
650.0000 mg | ORAL_TABLET | ORAL | Status: DC | PRN
  Administered 2013-04-13: 650 mg via ORAL
  Filled 2013-04-12: qty 2

## 2013-04-12 MED ORDER — LIDOCAINE HCL (CARDIAC) 20 MG/ML IV SOLN
INTRAVENOUS | Status: DC | PRN
  Administered 2013-04-12: 50 mg via INTRAVENOUS
  Administered 2013-04-12: 60 mg via INTRAVENOUS

## 2013-04-12 MED ORDER — OXYCODONE-ACETAMINOPHEN 5-325 MG PO TABS
1.0000 | ORAL_TABLET | ORAL | Status: DC | PRN
  Administered 2013-04-12 – 2013-04-15 (×9): 2 via ORAL
  Filled 2013-04-12 (×9): qty 2

## 2013-04-12 MED ORDER — BISACODYL 5 MG PO TBEC
5.0000 mg | DELAYED_RELEASE_TABLET | Freq: Every day | ORAL | Status: DC | PRN
  Administered 2013-04-14: 5 mg via ORAL
  Filled 2013-04-12: qty 1

## 2013-04-12 MED ORDER — 0.9 % SODIUM CHLORIDE (POUR BTL) OPTIME
TOPICAL | Status: DC | PRN
  Administered 2013-04-12: 1000 mL

## 2013-04-12 MED ORDER — BUPIVACAINE-EPINEPHRINE 0.5% -1:200000 IJ SOLN
INTRAMUSCULAR | Status: DC | PRN
  Administered 2013-04-12: 3 mL

## 2013-04-12 MED ORDER — CLINDAMYCIN PHOSPHATE 900 MG/50ML IV SOLN
900.0000 mg | Freq: Four times a day (QID) | INTRAVENOUS | Status: AC
  Administered 2013-04-12 – 2013-04-13 (×2): 900 mg via INTRAVENOUS
  Filled 2013-04-12 (×2): qty 50

## 2013-04-12 MED ORDER — PHENYLEPHRINE HCL 10 MG/ML IJ SOLN
10.0000 mg | INTRAVENOUS | Status: DC | PRN
  Administered 2013-04-12: 10 ug/min via INTRAVENOUS

## 2013-04-12 MED ORDER — LACTATED RINGERS IV SOLN
INTRAVENOUS | Status: DC | PRN
  Administered 2013-04-12 (×5): via INTRAVENOUS

## 2013-04-12 MED ORDER — FENTANYL CITRATE 0.05 MG/ML IJ SOLN
INTRAMUSCULAR | Status: DC | PRN
  Administered 2013-04-12 (×8): 50 ug via INTRAVENOUS

## 2013-04-12 MED ORDER — MEPERIDINE HCL 25 MG/ML IJ SOLN: 6.2500 mg | INTRAMUSCULAR | Status: DC | PRN

## 2013-04-12 MED ORDER — PROMETHAZINE HCL 25 MG/ML IJ SOLN: 6.2500 mg | INTRAMUSCULAR | Status: DC | PRN

## 2013-04-12 MED ORDER — VECURONIUM BROMIDE 10 MG IV SOLR
INTRAVENOUS | Status: DC | PRN
  Administered 2013-04-12 (×2): 2 mg via INTRAVENOUS
  Administered 2013-04-12: 4 mg via INTRAVENOUS
  Administered 2013-04-12 (×2): 2 mg via INTRAVENOUS

## 2013-04-12 MED ORDER — ONDANSETRON HCL 4 MG/2ML IJ SOLN: 4.0000 mg | INTRAMUSCULAR | Status: DC | PRN

## 2013-04-12 MED ORDER — HYDROMORPHONE HCL PF 1 MG/ML IJ SOLN
0.2500 mg | INTRAMUSCULAR | Status: DC | PRN
  Administered 2013-04-12 (×4): 0.5 mg via INTRAVENOUS

## 2013-04-12 MED ORDER — PHENOL 1.4 % MT LIQD: 1.0000 | OROMUCOSAL | Status: DC | PRN

## 2013-04-12 MED ORDER — HYDROCODONE-ACETAMINOPHEN 5-325 MG PO TABS
1.0000 | ORAL_TABLET | ORAL | Status: DC | PRN
  Administered 2013-04-13 (×2): 2 via ORAL
  Filled 2013-04-12 (×2): qty 2

## 2013-04-12 MED ORDER — INSULIN ASPART 100 UNIT/ML ~~LOC~~ SOLN
0.0000 [IU] | Freq: Every day | SUBCUTANEOUS | Status: DC
  Administered 2013-04-12: 2 [IU] via SUBCUTANEOUS

## 2013-04-12 MED ORDER — OXYCODONE HCL 5 MG PO TABS: 5.0000 mg | ORAL_TABLET | Freq: Once | ORAL | Status: DC | PRN

## 2013-04-12 MED ORDER — SODIUM CHLORIDE 0.9 % IJ SOLN: 3.0000 mL | INTRAMUSCULAR | Status: DC | PRN

## 2013-04-12 MED ORDER — ACETAMINOPHEN 10 MG/ML IV SOLN
1000.0000 mg | Freq: Once | INTRAVENOUS | Status: AC
  Administered 2013-04-12: 1000 mg via INTRAVENOUS

## 2013-04-12 MED ORDER — CITALOPRAM HYDROBROMIDE 20 MG PO TABS
20.0000 mg | ORAL_TABLET | Freq: Every day | ORAL | Status: DC
  Administered 2013-04-12 – 2013-04-14 (×3): 20 mg via ORAL
  Filled 2013-04-12 (×4): qty 1

## 2013-04-12 MED ORDER — METHOCARBAMOL 100 MG/ML IJ SOLN
500.0000 mg | Freq: Four times a day (QID) | INTRAVENOUS | Status: DC | PRN
  Filled 2013-04-12: qty 5

## 2013-04-12 MED ORDER — PROPOFOL 10 MG/ML IV BOLUS
INTRAVENOUS | Status: DC | PRN
  Administered 2013-04-12: 120 mg via INTRAVENOUS
  Administered 2013-04-12: 50 mg via INTRAVENOUS
  Administered 2013-04-12: 30 mg via INTRAVENOUS

## 2013-04-12 MED ORDER — GLIPIZIDE 10 MG PO TABS
10.0000 mg | ORAL_TABLET | Freq: Two times a day (BID) | ORAL | Status: DC
  Administered 2013-04-12 – 2013-04-15 (×6): 10 mg via ORAL
  Filled 2013-04-12 (×9): qty 1

## 2013-04-12 MED ORDER — DOCUSATE SODIUM 100 MG PO CAPS
100.0000 mg | ORAL_CAPSULE | Freq: Two times a day (BID) | ORAL | Status: DC
  Administered 2013-04-13 – 2013-04-15 (×5): 100 mg via ORAL
  Filled 2013-04-12 (×7): qty 1

## 2013-04-12 MED ORDER — ARTIFICIAL TEARS OP OINT
TOPICAL_OINTMENT | OPHTHALMIC | Status: DC | PRN
  Administered 2013-04-12: 1 via OPHTHALMIC

## 2013-04-12 MED ORDER — LABETALOL HCL 5 MG/ML IV SOLN
INTRAVENOUS | Status: DC | PRN
  Administered 2013-04-12: 10 mg via INTRAVENOUS
  Administered 2013-04-12: 5 mg via INTRAVENOUS

## 2013-04-12 MED ORDER — OXYCODONE HCL 5 MG/5ML PO SOLN: 5.0000 mg | Freq: Once | ORAL | Status: DC | PRN

## 2013-04-12 SURGICAL SUPPLY — 75 items
BAG DECANTER FOR FLEXI CONT (MISCELLANEOUS) ×2 IMPLANT
BLADE SURG 10 STRL SS (BLADE) ×2 IMPLANT
BLADE SURG ROTATE 9660 (MISCELLANEOUS) IMPLANT
BUR EGG ELITE 4.0 (BURR) IMPLANT
BUR NEURO DIAMOND 3X3.8 (BURR) IMPLANT
BUR ROUND FLUTED 4 SOFT TCH (BURR) IMPLANT
BURR NEURO DIAMOND 3X3.8 (BURR)
CANISTER SUCTION 2500CC (MISCELLANEOUS) ×2 IMPLANT
CLOTH BEACON ORANGE TIMEOUT ST (SAFETY) ×2 IMPLANT
CORDS BIPOLAR (ELECTRODE) ×2 IMPLANT
COVER BACK TABLE 24X17X13 BIG (DRAPES) IMPLANT
COVER MAYO STAND STRL (DRAPES) ×4 IMPLANT
COVER SURGICAL LIGHT HANDLE (MISCELLANEOUS) ×2 IMPLANT
DECANTER SPIKE VIAL GLASS SM (MISCELLANEOUS) ×2 IMPLANT
DRAPE C-ARM 42X72 X-RAY (DRAPES) ×4 IMPLANT
DRAPE C-ARMOR (DRAPES) ×4 IMPLANT
DRAPE INCISE IOBAN 66X45 STRL (DRAPES) ×2 IMPLANT
DRAPE MICROSCOPE LEICA (MISCELLANEOUS) ×2 IMPLANT
DRAPE POUCH INSTRU U-SHP 10X18 (DRAPES) ×2 IMPLANT
DRAPE PROXIMA HALF (DRAPES) ×2 IMPLANT
DRAPE TABLE COVER HEAVY DUTY (DRAPES) ×2 IMPLANT
DRSG ADAPTIC 3X8 NADH LF (GAUZE/BANDAGES/DRESSINGS) ×2 IMPLANT
DRSG COVADERM 4X8 (GAUZE/BANDAGES/DRESSINGS) ×2 IMPLANT
DRSG PAD ABDOMINAL 8X10 ST (GAUZE/BANDAGES/DRESSINGS) ×2 IMPLANT
DURAPREP 26ML APPLICATOR (WOUND CARE) ×2 IMPLANT
ELECT BLADE 4.0 EZ CLEAN MEGAD (MISCELLANEOUS) ×2
ELECT CAUTERY BLADE 6.4 (BLADE) ×2 IMPLANT
ELECT REM PT RETURN 9FT ADLT (ELECTROSURGICAL) ×2
ELECTRODE BLDE 4.0 EZ CLN MEGD (MISCELLANEOUS) ×1 IMPLANT
ELECTRODE REM PT RTRN 9FT ADLT (ELECTROSURGICAL) ×1 IMPLANT
GLOVE BIO SURGEON STRL SZ 6.5 (GLOVE) ×2 IMPLANT
GLOVE BIOGEL PI IND STRL 6.5 (GLOVE) ×1 IMPLANT
GLOVE BIOGEL PI INDICATOR 6.5 (GLOVE) ×1
GLOVE SURG SS PI 8.0 STRL IVOR (GLOVE) ×6 IMPLANT
GOWN EXTRA PROTECTION XL (GOWNS) IMPLANT
GOWN STRL NON-REIN LRG LVL3 (GOWN DISPOSABLE) ×6 IMPLANT
KIT BASIN OR (CUSTOM PROCEDURE TRAY) ×2 IMPLANT
KIT ROOM TURNOVER OR (KITS) ×2 IMPLANT
MANIFOLD NEPTUNE II (INSTRUMENTS) IMPLANT
MIX DBX 10CC 35% BONE (Bone Implant) ×4 IMPLANT
NEEDLE 22X1 1/2 (OR ONLY) (NEEDLE) ×2 IMPLANT
NEEDLE BONE MARROW 8GAX6 (NEEDLE) IMPLANT
NEEDLE SPNL 18GX3.5 QUINCKE PK (NEEDLE) ×6 IMPLANT
NS IRRIG 1000ML POUR BTL (IV SOLUTION) ×2 IMPLANT
PACK LAMINECTOMY ORTHO (CUSTOM PROCEDURE TRAY) ×2 IMPLANT
PACK UNIVERSAL I (CUSTOM PROCEDURE TRAY) ×2 IMPLANT
PAD ARMBOARD 7.5X6 YLW CONV (MISCELLANEOUS) ×4 IMPLANT
PATTIES SURGICAL .75X.75 (GAUZE/BANDAGES/DRESSINGS) ×4 IMPLANT
ROD EXEDIUM PREBENT 5.5 40MM (Rod) ×1 IMPLANT
ROD EXEDIUM PREBENT 5.5X40 (Rod) ×1 IMPLANT
ROD EXPEDIUM 5.5 45MM (Rod) ×2 IMPLANT
SCREW EXPEDIUM POLYAXIAL 6X40 (Screw) ×8 IMPLANT
SCREW EXPEDIUM POLYAXIAL 7X40M (Screw) ×2 IMPLANT
SCREW POLY EXPEDIUM 5.5 7X35MM (Screw) ×2 IMPLANT
SCREW SET SINGLE INNER (Screw) ×8 IMPLANT
SPECIMEN JAR SMALL (MISCELLANEOUS) IMPLANT
SPONGE GAUZE 4X4 12PLY (GAUZE/BANDAGES/DRESSINGS) ×2 IMPLANT
SPONGE LAP 4X18 X RAY DECT (DISPOSABLE) ×6 IMPLANT
SPONGE SURGIFOAM ABS GEL SZ50 (HEMOSTASIS) IMPLANT
STAPLER VISISTAT (STAPLE) IMPLANT
SURGIFLO TRUKIT (HEMOSTASIS) ×4 IMPLANT
SUT PDS AB 4-0 P3 18 (SUTURE) IMPLANT
SUT PROLENE 4 0 PS 2 18 (SUTURE) ×2 IMPLANT
SUT VIC AB 0 CT1 27 (SUTURE) ×2
SUT VIC AB 0 CT1 27XBRD ANBCTR (SUTURE) ×2 IMPLANT
SUT VIC AB 1 CT1 27 (SUTURE) ×4
SUT VIC AB 1 CT1 27XBRD ANBCTR (SUTURE) ×4 IMPLANT
SUT VIC AB 2-0 CT1 27 (SUTURE) ×2
SUT VIC AB 2-0 CT1 TAPERPNT 27 (SUTURE) ×2 IMPLANT
SUT VICRYL 0 UR6 27IN ABS (SUTURE) IMPLANT
SYR CONTROL 10ML LL (SYRINGE) ×2 IMPLANT
TOWEL OR 17X24 6PK STRL BLUE (TOWEL DISPOSABLE) ×2 IMPLANT
TOWEL OR 17X26 10 PK STRL BLUE (TOWEL DISPOSABLE) ×2 IMPLANT
TRAY FOLEY CATH 14FR (SET/KITS/TRAYS/PACK) ×2 IMPLANT
WATER STERILE IRR 1000ML POUR (IV SOLUTION) IMPLANT

## 2013-04-12 NOTE — Brief Op Note (Signed)
04/12/2013  7:35 PM  PATIENT:  Izora Gala  67 y.o. female  PRE-OPERATIVE DIAGNOSIS:  SPINAL STENOSIS/SPONDYLOTHISIS  POST-OPERATIVE DIAGNOSIS:  * No post-op diagnosis entered *  PROCEDURE:  Procedure(s): LUMBAR DECOMPRESSION L4-5, PEDICLE SCREW INSTRUMENTATION, LATERAL MASS FUSION, ALLOGRAFT AND AUTO GRAFT BONE (N/A)  SURGEON:  Surgeon(s) and Role:    * Javier Docker, MD - Primary    * Venita Lick, MD - Assisting  PHYSICIAN ASSISTANT:   ASSISTANTS: Elizebeth Koller  ANESTHESIA:   general  EBL:     BLOOD ADMINISTERED:none  DRAINS: none   LOCAL MEDICATIONS USED:  NONE  SPECIMEN:  No Specimen  DISPOSITION OF SPECIMEN:  N/A  COUNTS:  YES  TOURNIQUET:  * No tourniquets in log *  DICTATION: .Other Dictation: Dictation Number     [id number]  PLAN OF CARE: Admit to inpatient   PATIENT DISPOSITION:  PACU - hemodynamically stable.   Delay start of Pharmacological VTE agent (>24hrs) due to surgical blood loss or risk of bleeding: yes

## 2013-04-12 NOTE — Anesthesia Preprocedure Evaluation (Signed)
Anesthesia Evaluation  Patient identified by MRN, date of birth, ID band Patient awake    Reviewed: Allergy & Precautions, H&P , NPO status , Patient's Chart, lab work & pertinent test results  History of Anesthesia Complications Negative for: history of anesthetic complications  Airway Mallampati: II TM Distance: >3 FB Neck ROM: Full    Dental  (+) Teeth Intact and Dental Advisory Given   Pulmonary neg pulmonary ROS,  breath sounds clear to auscultation  Pulmonary exam normal       Cardiovascular hypertension, Pt. on medications Rhythm:Regular Rate:Normal     Neuro/Psych Chronic back pain: narcotics TIA   GI/Hepatic negative GI ROS, Neg liver ROS,   Endo/Other  diabetes (glu 114), Well Controlled, Type 2, Insulin DependentMorbid obesity  Renal/GU negative Renal ROS     Musculoskeletal   Abdominal (+) + obese,   Peds  Hematology   Anesthesia Other Findings   Reproductive/Obstetrics                           Anesthesia Physical Anesthesia Plan  ASA: III  Anesthesia Plan: General   Post-op Pain Management:    Induction: Intravenous  Airway Management Planned: Oral ETT  Additional Equipment:   Intra-op Plan:   Post-operative Plan: Extubation in OR  Informed Consent: I have reviewed the patients History and Physical, chart, labs and discussed the procedure including the risks, benefits and alternatives for the proposed anesthesia with the patient or authorized representative who has indicated his/her understanding and acceptance.   Dental advisory given  Plan Discussed with: Surgeon and CRNA  Anesthesia Plan Comments: (Plan routine monitors, GETA)        Anesthesia Quick Evaluation

## 2013-04-12 NOTE — H&P (View-Only) (Signed)
Virginia [name] is an 67 y.o. female.   Chief Complaint: back and B/L leg pain HPI: The patient is a 67 year old female now 10 weeks out from lumbar decompression (Bilateral hemilaminectomy and lateral recess decompression L4-5, foraminotomies L4 and L5). The first 2 weeks post-operatively she had zero leg pain. Her pre-op pain had been all right sided. 2 weeks post-op she presented with contralateral, left sided pain, and now reports recurrence of her right sided pain. Reports continued severe pain (bilateral lower extremeties), weakness, numbness (and tingling), burning, and pain with sitting. The following medication has been used for pain control: Hydromorphone 4mg, Meloxicam, Neurontin and Percocet prn. The patient reports their current pain level to be severe and 10 / 10. Pain refractory to narcotic and non-narcotic analgesics, NSAIDs, Neurontin, HEP, activity modification, relative rest.    Past Medical History  Diagnosis Date  . Hypertension   . Stroke     behind rt eye / visual defficiet rt eye  . Spinal stenosis of lumbar region   . Arthritis   . Difficulty sleeping     due to back pain  . Diabetes mellitus without complication     fasting 120-130s  . History of blood transfusion     Past Surgical History  Procedure Laterality Date  . Cholecystectomy    . Abdominal hysterectomy    . Oophorectomy    . Bladder surgery      bladder tack  . Cervical spine surgery      cervical fusion  . Lumbar laminectomy/decompression microdiscectomy N/A 01/31/2013    Procedure: MICRO LUMBAR DECOMPRESSION L4-5;  Surgeon: Jeffrey C Beane, MD;  Location: [facility];  Service: Orthopedics;  Laterality: N/A;  Bilateral Decompression L4-L5    No family history on file. Social History:  reports that she has never smoked. She does not have any smokeless tobacco history on file. She reports that she does not drink alcohol or use illicit drugs.  Allergies:  Allergies  Allergen Reactions  . Shellfish  Allergy Anaphylaxis     (Not in a hospital admission)  No results found for this or any previous visit (from the past 48 hour(s)). No results found.  Review of Systems  Constitutional: Negative.   HENT: Negative.   Eyes: Negative.   Respiratory: Negative.   Cardiovascular: Negative.   Gastrointestinal: Negative.   Genitourinary: Negative.   Musculoskeletal: Positive for back pain.  Skin: Negative.   Neurological: Positive for speech change and focal weakness.  Endo/Heme/Allergies: Negative.   Psychiatric/Behavioral: Negative.     There were no vitals taken for this visit. Physical Exam  Constitutional: She is oriented to person, place, and time. She appears well-developed and well-nourished.  HENT:  Head: Normocephalic and atraumatic.  Eyes: Conjunctivae and EOM are normal. Pupils are equal, round, and reactive to light.  Neck: Normal range of motion. Neck supple.  Cardiovascular: Normal rate and regular rhythm.   Respiratory: Effort normal and breath sounds normal.  GI: Soft. Bowel sounds are normal.  Musculoskeletal:  On exam, she's upright, in minimal distress. SLR produces buttock and thigh pain. She has pain more when she stands than when she sits. Pain with extension.  Lumbar spine exam reveals no evidence of soft tissue swelling, ecchymosis or deformity. The abdomen is soft and nontender. Nontender over the trochanters. No cellulitis or lymphadenopathy.  Motor is 5/5 including EHL, tibialis anterior, plantar flexion, quadriceps and hamstrings. Patient is normoreflexic. There is no Babinski or clonus. Sensory exam is intact to   light touch. The patient has good distal pulses. No DVT. No pain and normal range of motion without instability of the hips, knees and ankles.   Neurological: She is alert and oriented to person, place, and time. She has normal reflexes.  Skin: Skin is warm and dry.  Psychiatric: She has a normal mood and affect.     She's had her MRI  which indicates lateral recess stenosis, epidural fibrosis. Her MRI demonstrates decompression, no complications. There is some lateral recess stenosis that is moderate bilaterally. No change in her slight listhesis. EMG/NCV with persistent left L5 radiculopathy with increased insertional activity noted in the biceps femoris long head as well as the lumbar paraspinal muscles. Paraspinal muscle findings could be related to the recent surgery 01-31-13.  Assessment/Plan Spinal stenosis and spondylolisthesis L4-5  We discussed options in extensive detail. We discussed decompression of the contralateral side. My concern is that it has now returned on the right. My concern is, to perform an aggressive decompression, she would be at increased risk for a slip. She has a spondylolisthesis at this point. She would be best, given her size, to perform instrumentation with lateral mass fusion. I wouldn't do an interbody because she has a very large disc space.I had an extensive discussion of the risks and benefits of the lumbar decompression with the patient including bleeding, infection, damage to neurovascular structures, epidural fibrosis, CSF leak requiring repair. We also discussed increase in pain, adjacent segment disease, recurrent disc herniation, need for future surgery including repeat decompression and/or fusion. We also discussed risks of postoperative hematoma, paralysis, anesthetic complications including DVT, PE, death, cardiopulmonary dysfunction. In addition, the perioperative and postoperative courses were discussed in detail including the rehabilitative time and return to functional activity and work. I provided the patient with an illustrated handout and utilized the appropriate surgical models. We will do it at [facility]. We discussed this with Dr. Brooks. He agreed that a concomitant fusion is required. We will have this scheduled. Continue with pain management in the interim. We had a long  discussion concerning that. We did discuss a decompression, as well, with her but I do think this would be appropriate in terms of avoiding any further slip, given her postoperative course.  Plan: lumbar decompression L4-5, pedicle instrumentation, lateral mass fusion, allograft and autograft bone graft   BISSELL, JACLYN M. for Dr. Beane 04/11/2013, 4:52 PM    

## 2013-04-12 NOTE — Anesthesia Postprocedure Evaluation (Signed)
  Anesthesia Post-op Note  Patient: Virginia [name]  Procedure(s) Performed: Procedure(s): LUMBAR DECOMPRESSION L4-5, PEDICLE SCREW INSTRUMENTATION, LATERAL MASS FUSION, ALLOGRAFT AND AUTO GRAFT BONE (N/A)  Patient Location: PACU  Anesthesia Type:General  Level of Consciousness: awake, alert  and oriented  Airway and Oxygen Therapy: Patient Spontanous Breathing and Patient connected to nasal cannula oxygen  Post-op Pain: mild  Post-op Assessment: Post-op Vital signs reviewed, Patient's Cardiovascular Status Stable, Respiratory Function Stable, Patent Airway and Pain level controlled  Post-op Vital Signs: stable  Complications: No apparent anesthesia complications

## 2013-04-12 NOTE — Transfer of Care (Signed)
Immediate Anesthesia Transfer of Care Note  Patient: Virginia [name]  Procedure(s) Performed: Procedure(s): LUMBAR DECOMPRESSION L4-5, PEDICLE SCREW INSTRUMENTATION, LATERAL MASS FUSION, ALLOGRAFT AND AUTO GRAFT BONE (N/A)  Patient Location: PACU  Anesthesia Type:General  Level of Consciousness: oriented, sedated, patient cooperative and responds to stimulation  Airway & Oxygen Therapy: Patient Spontanous Breathing and Patient connected to face mask oxygen  Post-op Assessment: Report given to PACU RN, Post -op Vital signs reviewed and stable, Patient moving all extremities and Patient moving all extremities X 4  Post vital signs: Reviewed and stable  Complications: No apparent anesthesia complications

## 2013-04-12 NOTE — Anesthesia Procedure Notes (Signed)
Procedure Name: Intubation Date/Time: 04/12/2013 1:33 PM Performed by: Coralee Rud Pre-anesthesia Checklist: Patient identified, Emergency Drugs available, Suction available and Patient being monitored Patient Re-evaluated:Patient Re-evaluated prior to inductionOxygen Delivery Method: Circle system utilized Preoxygenation: Pre-oxygenation with 100% oxygen Intubation Type: IV induction Ventilation: Mask ventilation without difficulty and Oral airway inserted - appropriate to patient size Laryngoscope Size: Miller and 3 Grade View: Grade II Tube size: 7.5 mm Number of attempts: 2 Airway Equipment and Method: Stylet Placement Confirmation: ETT inserted through vocal cords under direct vision,  breath sounds checked- equal and bilateral and positive ETCO2 Secured at: 22 cm Tube secured with: Tape Dental Injury: Teeth and Oropharynx as per pre-operative assessment

## 2013-04-12 NOTE — Interval H&P Note (Signed)
History and Physical Interval Note:  04/12/2013 7:14 AM  Virginia [name]  has presented today for surgery, with the diagnosis of SPINAL STENOSIS/SPONDYLOTHISIS  The various methods of treatment have been discussed with the patient and family. After consideration of risks, benefits and other options for treatment, the patient has consented to  Procedure(s): LUMBAR DECOMPRESSION L4-5, PEDICLE SCREW INSTRUMENTATION, LATERAL MASS FUSION, ALLOGRAFT AND AUTO GRAFT BONE (N/A) as a surgical intervention .  The patient's history has been reviewed, patient examined, no change in status, stable for surgery.  I have reviewed the patient's chart and labs.  Questions were answered to the patient's satisfaction.     Rydell Wiegel C

## 2013-04-13 LAB — BASIC METABOLIC PANEL WITH GFR
BUN: 12 mg/dL (ref 6–23)
CO2: 27 meq/L (ref 19–32)
Calcium: 8.2 mg/dL — ABNORMAL LOW (ref 8.4–10.5)
Chloride: 99 meq/L (ref 96–112)
Creatinine, Ser: 0.68 mg/dL (ref 0.50–1.10)
GFR calc Af Amer: >90 mL/min
GFR calc non Af Amer: 90 mL/min — ABNORMAL LOW
Glucose, Bld: 190 mg/dL — ABNORMAL HIGH (ref 70–99)
Potassium: 4 meq/L (ref 3.5–5.1)
Sodium: 135 meq/L (ref 135–145)

## 2013-04-13 LAB — GLUCOSE, CAPILLARY
Glucose-Capillary: 146 mg/dL — ABNORMAL HIGH (ref 70–99)
Glucose-Capillary: 100 mg/dL — ABNORMAL HIGH (ref 70–99)
Glucose-Capillary: 83 mg/dL (ref 70–99)
Glucose-Capillary: 149 mg/dL — ABNORMAL HIGH (ref 70–99)

## 2013-04-13 LAB — CBC
HCT: 29.3 % — ABNORMAL LOW (ref 36.0–46.0)
Hemoglobin: 10 g/dL — ABNORMAL LOW (ref 12.0–15.0)
MCH: 30.8 pg (ref 26.0–34.0)
MCHC: 34.1 g/dL (ref 30.0–36.0)
MCV: 90.2 fL (ref 78.0–100.0)
Platelets: 192 K/uL (ref 150–400)
RBC: 3.25 MIL/uL — ABNORMAL LOW (ref 3.87–5.11)
RDW: 12.8 % (ref 11.5–15.5)
WBC: 13.3 K/uL — ABNORMAL HIGH (ref 4.0–10.5)

## 2013-04-13 MED ORDER — HYDROMORPHONE HCL 2 MG PO TABS
2.0000 mg | ORAL_TABLET | ORAL | Status: DC | PRN
  Administered 2013-04-13 – 2013-04-15 (×5): 4 mg via ORAL
  Filled 2013-04-13 (×5): qty 2

## 2013-04-13 MED ORDER — KETOROLAC TROMETHAMINE 15 MG/ML IJ SOLN
7.5000 mg | Freq: Four times a day (QID) | INTRAMUSCULAR | Status: DC
  Administered 2013-04-13: 7.5 mg via INTRAVENOUS
  Filled 2013-04-13 (×3): qty 1

## 2013-04-13 MED ORDER — ACETAMINOPHEN 500 MG PO TABS: 1000.0000 mg | ORAL_TABLET | Freq: Four times a day (QID) | ORAL | Status: DC | PRN

## 2013-04-13 NOTE — Progress Notes (Addendum)
MD called into patients room and spoke with patient. Then RN spoke with attending MD. Shelle Iron, MD adjusted some other medication orders and instructed RN to change dressing, and apply a new 4x4 and ABD dressing.   Per MD, Toradol is not suggested in patients who have had back surgery because it can make them bleed.

## 2013-04-13 NOTE — Evaluation (Signed)
Physical Therapy Evaluation Patient Details Name: Virginia [name] MRN: [id number] DOB: September 29, 1946 Today's Date: 04/13/2013 Time: 4098-1191 PT Time Calculation (min): 28 min  PT Assessment / Plan / Recommendation Clinical Impression  Pt is a pleasant 67 y.o. female s/p L4-L5 decompression and fusion. Presents with decreased independence with mobility and gt. Will benefit from skilled PT to maximize functional mobility and ensure safe D/C home with husband.     PT Assessment  Patient needs continued PT services    Follow Up Recommendations  Supervision/Assistance - 24 hour    Does the patient have the potential to tolerate intense rehabilitation      Barriers to Discharge None      Equipment Recommendations  None recommended by PT    Recommendations for Other Services     Frequency Min 5X/week    Precautions / Restrictions Precautions Precautions: Back;Fall Precaution Booklet Issued: Yes (comment) Precaution Comments: given handout and educated on back precautions; able to verbalize 3/3 at end of session Restrictions Weight Bearing Restrictions: No   Pertinent Vitals/Pain 7-8/10 with activity; pt premedicated.       Mobility  Bed Mobility Bed Mobility: Right Sidelying to Sit (pt lying on R side) Right Sidelying to Sit: HOB elevated;With rails;4: Min assist Details for Bed Mobility Assistance: assistance to pull to upright position and vc's for sequencing to maintain back precautions Transfers Transfers: Sit to Stand;Stand to Sit Sit to Stand: 4: Min guard;From bed;With upper extremity assist Stand to Sit: 5: Supervision;With armrests;To chair/3-in-1 Details for Transfer Assistance: vc's for hand placement and sequencing; demo good technique for toilet transfer with handicap rails; pt to benefit from elevated toilet seat to ensure adherence to back precautions Ambulation/Gait Ambulation/Gait Assistance: 4: Min guard Ambulation Distance (Feet): 20 Feet Assistive  device: Rolling walker Ambulation/Gait Assistance Details: pt unsteady with gt initially requiring steadying; vc's for gt/RW sequencing and safety; unable to safely increase gt speed  Gait Pattern: Step-through pattern;Narrow base of support (ER LEs) Gait velocity: decreased due to pain and balance deficits Stairs: No Wheelchair Mobility Wheelchair Mobility: No    Exercises Total Joint Exercises Ankle Circles/Pumps: AROM;Both;10 reps;Supine   PT Diagnosis: Difficulty walking;Acute pain  PT Problem List: Decreased activity tolerance;Decreased balance;Decreased mobility;Decreased knowledge of use of DME;Decreased safety awareness;Pain PT Treatment Interventions: DME instruction;Gait training;Stair training;Functional mobility training;Therapeutic activities;Balance training;Neuromuscular re-education;Patient/family education   PT Goals Acute Rehab PT Goals PT Goal Formulation: With patient Time For Goal Achievement: 04/17/13 Potential to Achieve Goals: Good Pt will go Supine/Side to Sit: with modified independence PT Goal: Supine/Side to Sit - Progress: Goal set today Pt will go Sit to Supine/Side: with modified independence PT Goal: Sit to Supine/Side - Progress: Goal set today Pt will Ambulate: >150 feet;with modified independence;with least restrictive assistive device PT Goal: Ambulate - Progress: Goal set today Pt will Go Up / Down Stairs: 1-2 stairs;with supervision;with least restrictive assistive device PT Goal: Up/Down Stairs - Progress: Goal set today Additional Goals Additional Goal #1: pt to be able to indpendently recall 3/3 back precautions.  PT Goal: Additional Goal #1 - Progress: Goal set today  Visit Information  Last PT Received On: 04/13/13 Assistance Needed: +1    Subjective Data  Subjective: "the doctor said ill be here for about 4 days. I had surgery 10 weeks ago. I just need refreshing on some things" Patient Stated Goal: home with husband    Prior  Functioning  Home Living Lives With: Significant other Available Help at Discharge: Available 24 hours/day;Family Type  of Home: House Home Access: Stairs to enter [employer] of Steps: 2 Entrance Stairs-Rails: None Home Layout: One level Bathroom Shower/Tub: Engineer, manufacturing systems: Standard Bathroom Accessibility: Yes How Accessible: Accessible via walker Home Adaptive Equipment: Walker - rolling;Shower chair with back;Bedside commode/3-in-1 Additional Comments: is going to get shower chair from family  Prior Function Level of Independence: Needs assistance (since 10 weeks ago; had decompression) Needs Assistance: Dressing Dressing: Minimal Able to Take Stairs?: Yes Driving: No Vocation: Retired Musician: No difficulties Dominant Hand: Right    Cognition  Cognition Arousal/Alertness: Awake/alert Behavior During Therapy: WFL for tasks assessed/performed Overall Cognitive Status: Within Functional Limits for tasks assessed    Extremity/Trunk Assessment Right Lower Extremity Assessment RLE ROM/Strength/Tone: WFL for tasks assessed RLE Sensation: WFL - Light Touch Left Lower Extremity Assessment LLE ROM/Strength/Tone: WFL for tasks assessed LLE Sensation: WFL - Light Touch Trunk Assessment Trunk Assessment: Normal   Balance Balance Balance Assessed: Yes Static Sitting Balance Static Sitting - Balance Support: Left upper extremity supported;Right upper extremity supported;Feet supported Static Sitting - Level of Assistance: 5: Stand by assistance Static Sitting - Comment/# of Minutes: tolerated sitting EOB ~4 min; intially dizzy subsided quickly   End of Session PT - End of Session Equipment Utilized During Treatment: Gait belt Activity Tolerance: Patient limited by pain Patient left: in chair;with call bell/phone within reach Nurse Communication: Mobility status  GP     Donell Sievert, [place] [phone] 04/13/2013, 10:04 AM

## 2013-04-13 NOTE — Care Management Note (Signed)
CARE MANAGEMENT NOTE 04/13/2013  Patient:  Virginia [name], Virginia [name]   Account Number:  [id number]  Date Initiated:  04/13/2013  Documentation initiated by:  Vance Peper  Subjective/Objective Assessment:   67 yr old female s/p L4-L5 decompression     Action/Plan:   CM spoke with patient concerning home health and DME needs at discharge. Choice offered.   Anticipated DC Date:     Anticipated DC Plan:  HOME W HOME HEALTH SERVICES         Choice offered to / List presented to:             [facility] agency  [facility]   Status of service:  In process, will continue to follow Medicare Important Message given?   (If response is "NO", the following Medicare IM given date fields will be blank) Date Medicare IM given:   Date Additional Medicare IM given:    Discharge Disposition:    Per UR Regulation:    If discussed at Long Length of Stay Meetings, dates discussed:    Comments:

## 2013-04-13 NOTE — Progress Notes (Signed)
Called he on call pager for [facility]. [employer] on call. Babish and Charlann Boxer in surgery. Charlann Boxer gave RN orders to help manage patients pain.   Orders given: 2 to 4 mg PO Dilaudid every 4 hours.  7.5 mg IV toradol every 6 hours, for 48 hours,  And  1000 mg of PO tylenol PO pain  Informed MD that patient on percocet, and vicodan (either or, not both) for pain management as well, but did not change these medication orders.  RN actions: 4 mg PO dilaudid given to patient, and 7.5mg  toradol   Pharmacist did not feel comfortable adjusting and approving new dose of tylenol.

## 2013-04-13 NOTE — Op Note (Signed)
NAMEMEKESHA, [name] NO.:  [id number]  MEDICAL RECORD NO.:  [id number]  LOCATION:  [id number]                        FACILITY:  [facility]  PHYSICIAN:  Jene Every, M.D.    DATE OF BIRTH:  1946/10/01  DATE OF PROCEDURE:  04/12/2013 DATE OF DISCHARGE:                              OPERATIVE REPORT   PREOPERATIVE DIAGNOSIS:  Spinal listhesis, degenerative; lateral recess stenosis, recurrent.  POSTOPERATIVE DIAGNOSIS:  Spinal listhesis, degenerative; lateral recess stenosis, recurrent.  PROCEDURE PERFORMED: 1. Redo lumbar decompression essentially at L4-5.  Foraminotomies L4     and L5 bilaterally. 2. Posterior lateral fusion at L4-5 with pedicle screw instrumentation     L4-5. 3. Autograft and allograft bone graft.  ANESTHESIA:  General.  ASSISTANT:  Alvy Beal, MD and Lanna Poche, [place].  INSTRUMENTS UTILIZED:  DePuy pedicle screw system, we used 6.0 screws at L4, 7.0 screw on the left at L4 and 6.0 screw on the right at L4, 40 and 45 mm length rods, DBX bone graft, autologous and allograft bone graft.  HISTORY:  This is a 67 year old female who has had a history of lumbar decompression over 6 weeks ago.  She initially did well for a few weeks and had recurrent radicular pain in the opposite side.  Persistent symptoms despite rest, activity modification, corticosteroid injection, and pain management.  Repeat MRI indicated epidural fibrosis lateral recess stenosis.  She had severe underlying pain, refractory narcotic analgesics.  We felt that perhaps she would require more aggressive decompression and that perhaps her listhesis was generating facet pain and referred pain due to the severity of her pain.  We discussed in consultation with Dr. Shon Baton about redo decompression, foraminotomies, and due to her high risk of further slip following the decompression, instrumentation was recommended a lateral mass fusion.  It was felt with her high disk space at  L4-5 that interbody was not feasible as it would be difficult to fuse that disk space given the dimensions of that disk space.  Risks and benefits discussed including bleeding, infection, damage to the neurovascular structure, DVT, PE, and anesthetic complications, nonunion, etc.  TECHNIQUE:  The patient placed in supine position.  After induction of adequate general anesthesia, 2 g Kefzol, 900 of clindamycin, she was placed prone on the Ramsey table.  All bony prominences well padded. Foley to gravity.  She had a significantly redundant abdomen.  She was morbidly obese.  She was checked beneath the operating room table and on as well.  Ted stockings.  Lumbar region was prepped, draped in the usual sterile fashion.  Previous surgical incision was utilized, extended cephalad and caudad.  Subcutaneous tissue was dissected by electrocautery to achieve hemostasis.  Fascia lata identified by line of skin incision.  Paraspinous muscle elevated from over L4-5 and after L4 we continued the dissection down over the facets which were hypertrophic identifying the transverse processes of L4 and L5 creating a lateral gutter.  We then used the [employer].  We had at the beginning of the case performed and initial localization scan.  At this point in time, we used the centralizing probe on the spinous process of L4 that was used  for 3-dimensional localization of the anatomy.  First turned our attention towards the transverse process of L4 on the left and at a point intersecting transverse process, and the lateral aspect of the fact slightly up on the lateral aspect and the second used an awl, and guided that and coplaner utilizing the navigation system. These were zeroed into the main transmitter and in through 3 planes we then started our pilot hole and then used a pedicle probe finder and advanced into the vertebral body.  This was felt to converge the center into the pedicle.  This  was then tapped and a 6.0 40-mm strength length screw was then inserted with excellent purchase.  Turned attention then to the pedicle L5.  She had a very hypertrophic facet removed portion of the facet.  At the junction between the TPA and that portion of the facet we used an awl and navigation system converging to the middle of the pedicle in 3 planes using our pedicle probe and entered the vertebral body without difficulty as it was done at the pedicle of L4. We then used a pedicle probe similar to that finding the pedicle of L4 and there was bone on all 4 surfaces.  We then placed a 40 mm 6.0 screw. With excellent purchase confirming the location within the pedicle.  In a similar fashion, we found the intersections on the patient's right- hand side.  First the pedicle of L4 and then L5, and removing a portion of the lateral aspect of the facet finding the intersection between the TP and lateral aspect of the facet using the awl navigation system converging and in 3 planes using our pedicle finder without difficulty using the ball probe which was used to check the surfaces, and we felt we were in the pedicles on the right as well.  They were tapped and 2 6.0 screws, 40 mm length were inserted.  We then checked in the AP and lateral plane, where fluoroscopy and felt that the 2 screws on the right were a bit on the inferior side.  These were then removed, readjusted, converged, and placed in the center of the pedicle after using a ball tip probe to probe the canal and again it was felt that the bone on all surfaces, there was excellent purchase.  The screw on the left at L5 was felt to be slightly long in the lateral plane.  The intraoperative CT revealed that a portion of the lateral screws were closed to the vertebral endplate to the vertebral body the anterolateral cortex.  We then substituted this for 7.0, 35-mm screw with excellent purchase.  We used the fluoroscopy in the AP and  lateral plane to confirm that we felt the pedicle screws were in the pedicles converging appropriately.  With the navigation system and the transponders felt that perhaps the central transmitter slightly moved.  We decorticated the lateral aspect of the facets and the TPs.  I then placed a McCullough retractor, brought the operating microscope into the surgical field to proceed with the decompression.  We used a straight curette to detach the previous epidural fibrosis from the previous laminotomy, enlarged the laminotomy bilaterally with 2-mm Kerrison on the caudad edge of L4, and the cephalad edge of L5, performed foraminotomies of both.  On the left, we decompressed further lateral recess to the medial border of the pedicle. Undercut the superior articulating facet of L5 and enlarged the foraminotomy and undercut the facet, protecting neural elements at all times.  Hockey-stick probe passed freely up the foramen of L4 and L5 following that.  Bipolar electrocautery was utilized to achieve hemostasis.  There was no active bleeding or CSF leakage.  In a similar fashion, we decompressed the lateral recess on the right, utilizing a straight curette to detach to skeletonize the previous laminotomy, protecting the neural elements of the thecal sac__________, performing the foraminotomies of L4 and L5 undercutting of the facet.  No evidence of disk herniation.  Bipolar electrocautery was utilized to achieve hemostasis.  Hockey-stick probe passed freely up the foramen of L4 and L5.  Wound was copiously irrigated.  Following this, I copiously irrigated the wound.  Inspection revealed no CSF leakage or active bleeding.  Removed the operating microscope.  Placed thrombin-soaked Gelfoam in the laminotomy defect.  We then placed a DBX bone graft in the gutters bilaterally after decortication.  We selected 40 and 45-mm rod respectively to the left than right, and then placed our set screws to engage  the rod in the pedicle screw head and then used derotation and a torque wrench, torqued with 80 foot pounds bilaterally.  Final AP and lateral radiographs of the construct found to be satisfactory in the AP and lateral plane.  We had placed FloSeal to aid in hemostasis.  She had about 800 mL of blood loss.  She had steady blood loss throughout.  We also used bone wax on the cancellous surfaces.  We had copiously irrigated the wound with antibiotic irrigation just prior to placing in our bone graft.  We then inspected no evidence of active bleeding. Copiously irrigated all bleeders again.  The dorsolumbar fascia with 1 Vicryl interrupted figure-of-8 sutures, subcutaneous with 2-0 Vicryl simple sutures.  Skin was reapproximated with staples.  Wound was dressed sterilely, placed supine on the hospital bed, extubated without difficulty, and transported to the recovery in satisfactory condition.  The patient tolerated the procedure well.  No complications.     Jene Every, M.D.     Cordelia Pen  D:  04/12/2013  T:  04/13/2013  Job:  [id number]

## 2013-04-13 NOTE — Evaluation (Signed)
Occupational Therapy Evaluation Patient Details Name: Virginia [name] MRN: [id number] DOB: 06/12/1946 Today's Date: 04/13/2013 Time: 4098-1191 OT Time Calculation (min): 22 min  OT Assessment / Plan / Recommendation Clinical Impression  Pt is a 67 yo female admitted for L4-L5 decompression and fusion who underwent just a decompression 10 weeks ago and therefore is familiar with adl techniques.  Pt would benefit from cont OT to increase I with LE adls so she can return home with her husband.    OT Assessment  Patient needs continued OT Services    Follow Up Recommendations  Home health OT    Barriers to Discharge None    Equipment Recommendations  None recommended by OT    Recommendations for Other Services    Frequency  Min 2X/week    Precautions / Restrictions Precautions Precautions: Back;Fall Precaution Booklet Issued: Yes (comment) Precaution Comments: given handout and educated on back precautions; able to verbalize 3/3 at end of session Restrictions Weight Bearing Restrictions: No   Pertinent Vitals/Pain Pt with 8/10 pain.  Pain meds given treatment.      ADL  Eating/Feeding: Performed;Independent Where Assessed - Eating/Feeding: Chair Grooming: Performed;Wash/dry hands;Supervision/safety Where Assessed - Grooming: Supported standing Upper Body Bathing: Simulated;Set up Where Assessed - Upper Body Bathing: Unsupported sitting Lower Body Bathing: Simulated;Moderate assistance Where Assessed - Lower Body Bathing: Supported sit to stand Upper Body Dressing: Performed;Minimal assistance Where Assessed - Upper Body Dressing: Unsupported sitting Lower Body Dressing: Performed;Moderate assistance Where Assessed - Lower Body Dressing: Supported sit to stand Toilet Transfer: Performed;Min Pension scheme manager Method: Other (comment) (ambulated to the bathroom.) Toilet Transfer Equipment: Comfort height toilet;Grab bars Toileting - Clothing Manipulation and Hygiene:  Performed;Min guard Where Assessed - Engineer, mining and Hygiene: Standing Equipment Used: Rolling walker Transfers/Ambulation Related to ADLs: Pt walked in room with min guard to S. ADL Comments: pt with more difficulty doing LE adls.  pt has all AE at home but usually just has her husband assist her with LE adls.    OT Diagnosis: Generalized weakness;Acute pain  OT Problem List: Decreased strength;Impaired balance (sitting and/or standing);Pain OT Treatment Interventions: Self-care/ADL training;DME and/or AE instruction   OT Goals Acute Rehab OT Goals OT Goal Formulation: With patient Time For Goal Achievement: 04/20/13 Potential to Achieve Goals: Good ADL Goals Pt Will Perform Grooming: with modified independence;Standing at sink ADL Goal: Grooming - Progress: Goal set today Pt Will Perform Lower Body Bathing: with supervision;Sit to stand from chair;with adaptive equipment ADL Goal: Lower Body Bathing - Progress: Goal set today Pt Will Perform Lower Body Dressing: with supervision;Sit to stand from chair;with adaptive equipment ADL Goal: Lower Body Dressing - Progress: Goal set today Pt Will Perform Tub/Shower Transfer: Tub transfer;Shower seat with back;with supervision;Maintaining back safety precautions ADL Goal: Tub/Shower Transfer - Progress: Goal set today Additional ADL Goal #1: Pt will complete all aspects of toileting on raised commode seat and mod I. ADL Goal: Additional Goal #1 - Progress: Goal set today  Visit Information  Last OT Received On: 04/13/13 Assistance Needed: +1    Subjective Data  Subjective: "I have done this all before." Patient Stated Goal: to go home    Prior Functioning     Home Living Lives With: Significant other Available Help at Discharge: Available 24 hours/day;Family Type of Home: House Home Access: Stairs to enter [employer] of Steps: 2 Entrance Stairs-Rails: None Home Layout: One level Bathroom  Shower/Tub: Tub/shower unit;Door [employer] Toilet: Standard Bathroom Accessibility: Yes How Accessible: Accessible via  walker Home Adaptive Equipment: Walker - rolling;Shower chair with back;Bedside commode/3-in-1 Additional Comments: is going to get shower chair from family  Prior Function Level of Independence: Needs assistance Needs Assistance: Meal Prep;Light Housekeeping;Dressing Dressing: Minimal Meal Prep: Maximal Light Housekeeping: Maximal Able to Take Stairs?: Yes Driving: No Vocation: Retired Musician: No difficulties Dominant Hand: Right         Vision/Perception Vision - History Baseline Vision: Wears glasses only for reading Patient Visual Report: No change from baseline Vision - Assessment Vision Assessment: Vision not tested   [employer] Arousal/Alertness: Awake/alert Behavior During Therapy: WFL for tasks assessed/performed Overall Cognitive Status: Within Functional Limits for tasks assessed    Extremity/Trunk Assessment Right Upper Extremity Assessment RUE ROM/Strength/Tone: Within functional levels RUE Sensation: WFL - Light Touch RUE Coordination: WFL - gross/fine motor Left Upper Extremity Assessment LUE ROM/Strength/Tone: Within functional levels LUE Sensation: WFL - Light Touch LUE Coordination: WFL - gross/fine motor Right Lower Extremity Assessment RLE ROM/Strength/Tone: WFL for tasks assessed RLE Sensation: WFL - Light Touch Left Lower Extremity Assessment LLE ROM/Strength/Tone: WFL for tasks assessed LLE Sensation: WFL - Light Touch Trunk Assessment Trunk Assessment: Normal     Mobility Bed Mobility Bed Mobility: Right Sidelying to Sit;Sit to Supine Right Sidelying to Sit: HOB elevated;With rails;4: Min assist Sit to Supine: 4: Min assist;HOB elevated;With rail Details for Bed Mobility Assistance: assistance to pull to upright position and vc's for sequencing to maintain back  precautions Transfers Transfers: Sit to Stand;Stand to Sit Sit to Stand: 4: Min guard;From bed;With upper extremity assist Stand to Sit: 5: Supervision;With armrests;To chair/3-in-1 Details for Transfer Assistance: vc's for hand placement and sequencing; demo good technique for toilet transfer with handicap rails; pt to benefit from elevated toilet seat to ensure adherence to back precautions     Exercise Total Joint Exercises Ankle Circles/Pumps: AROM;Both;10 reps;Supine   Balance Balance Balance Assessed: Yes Static Sitting Balance Static Sitting - Balance Support: Left upper extremity supported;Right upper extremity supported;Feet supported Static Sitting - Level of Assistance: 5: Stand by assistance Static Sitting - Comment/# of Minutes: 5   End of Session OT - End of Session Activity Tolerance: Patient tolerated treatment well Patient left: in bed;with call bell/phone within reach Nurse Communication: Mobility status  GO     Hope Budds 04/13/2013, 10:30 AM [phone]

## 2013-04-13 NOTE — Progress Notes (Signed)
PT is recommending home with HH and not SNF. CSW will make CM aware. Clinical Social Worker will sign off for now as social work intervention is no longer needed. Please consult us again if new need arises.   Romeka Scifres, MSW [phone] 

## 2013-04-13 NOTE — Progress Notes (Signed)
Subjective: 1 Day Post-Op Procedure(s) (LRB): LUMBAR DECOMPRESSION L4-5, PEDICLE SCREW INSTRUMENTATION, LATERAL MASS FUSION, ALLOGRAFT AND AUTO GRAFT BONE (N/A) Patient reports pain as 5 on 0-10 scale.    Objective: Vital signs in last 24 hours: Temp:  [97 F (36.1 C)-98.7 F (37.1 C)] 98.6 F (37 C) (05/16 0538) Pulse Rate:  [64-87] 68 (05/16 0538) Resp:  [11-27] 16 (05/16 0538) BP: (128-170)/(43-82) 130/65 mmHg (05/16 0538) SpO2:  [91 %-99 %] 95 % (05/16 0538) Weight:  [104.101 kg (229 lb 8 oz)] 104.101 kg (229 lb 8 oz) (05/15 1350)  Intake/Output from previous day: 05/15 0701 - 05/16 0700 In: 6240 [P.O.:540; I.V.:5200; IV Piggyback:500] Out: 2800 [Urine:2000; Blood:800] Intake/Output this shift: Total I/O In: 1590 [P.O.:540; I.V.:1050] Out: 1300 [Urine:1300]   Recent Labs  04/12/13 1809 04/13/13 0410  HGB 10.5* 10.0*    Recent Labs  04/12/13 1809 04/13/13 0410  WBC  --  13.3*  RBC  --  3.25*  HCT 31.0* 29.3*  PLT  --  192    Recent Labs  04/12/13 1809 04/13/13 0410  NA 136 135  K 4.4 4.0  CL  --  99  CO2  --  27  BUN  --  12  CREATININE  --  0.68  GLUCOSE 215* 190*  CALCIUM  --  8.2*   No results found for this basename: LABPT, INR,  in the last 72 hours  Neurologically intact Sensation intact distally Intact pulses distally Incision: dressing C/D/I  Assessment/Plan: 1 Day Post-Op Procedure(s) (LRB): LUMBAR DECOMPRESSION L4-5, PEDICLE SCREW INSTRUMENTATION, LATERAL MASS FUSION, ALLOGRAFT AND AUTO GRAFT BONE (N/A) Advance diet Up with therapy D/C IV fluids Discussed OP. ISB. Gluc control. OOB. SCD Legs feel better than preop.   Euel Castile C 04/13/2013, 6:27 AM

## 2013-04-13 NOTE — Progress Notes (Signed)
UR COMPLETED  

## 2013-04-14 LAB — GLUCOSE, CAPILLARY
Glucose-Capillary: 92 mg/dL (ref 70–99)
Glucose-Capillary: 58 mg/dL — ABNORMAL LOW (ref 70–99)
Glucose-Capillary: 142 mg/dL — ABNORMAL HIGH (ref 70–99)

## 2013-04-14 MED ORDER — FLEET ENEMA 7-19 GM/118ML RE ENEM
1.0000 | ENEMA | Freq: Once | RECTAL | Status: AC
  Administered 2013-04-14: 1 via RECTAL
  Filled 2013-04-14: qty 1

## 2013-04-14 MED ORDER — MAGNESIUM CITRATE PO SOLN
0.5000 | Freq: Once | ORAL | Status: AC
  Administered 2013-04-14: 0.5 via ORAL
  Filled 2013-04-14: qty 296

## 2013-04-14 NOTE — Progress Notes (Signed)
    Subjective: Procedure(s) (LRB): LUMBAR DECOMPRESSION L4-5, PEDICLE SCREW INSTRUMENTATION, LATERAL MASS FUSION, ALLOGRAFT AND AUTO GRAFT BONE (N/A) 2 Days Post-Op  Patient reports pain as 4 on 0-10 scale.  Reports decreased leg pain reports incisional back pain   Positive void Negative bowel movement Positive flatus Negative chest pain or shortness of breath  Objective: Vital signs in last 24 hours: Temp:  [97.9 F (36.6 C)-99.7 F (37.6 C)] 97.9 F (36.6 C) (05/17 0714) Pulse Rate:  [88-106] 88 (05/17 0714) Resp:  [18] 18 (05/17 0714) BP: (136-140)/(60-65) 140/60 mmHg (05/17 0714) SpO2:  [96 %-99 %] 99 % (05/17 0714)  Intake/Output from previous day: 05/16 0701 - 05/17 0700 In: 429.7 [I.V.:429.7] Out: -   Labs:  Recent Labs  04/12/13 1809 04/13/13 0410  WBC  --  13.3*  RBC  --  3.25*  HCT 31.0* 29.3*  PLT  --  192    Recent Labs  04/12/13 1809 04/13/13 0410  NA 136 135  K 4.4 4.0  CL  --  99  CO2  --  27  BUN  --  12  CREATININE  --  0.68  GLUCOSE 215* 190*  CALCIUM  --  8.2*   No results found for this basename: LABPT, INR,  in the last 72 hours  Physical Exam: Neurologically intact Intact pulses distally Incision: dressing C/D/I and no drainage Compartment soft  Assessment/Plan: Patient stable  xrays n/a Continue mobilization with physical therapy Continue care  Advance diet Up with therapy Plan for discharge tomorrow   Venita Lick, MD [facility] [phone]

## 2013-04-14 NOTE — Progress Notes (Signed)
Occupational Therapy Discharge Patient Details Name: Virginia [name] MRN: [id number] DOB: Apr 10, 1946 Today's Date: 04/14/2013 Time:  -     Patient discharged from OT services secondary to pt and husband report that they feel they have all the BADLs covered due to pt's back decompression surgery 2 months ago. No further OT needs identified, will sign off..  Please see latest therapy progress note for current level of functioning and progress toward goals.    Progress and discharge plan discussed with patient and/or caregiver: Patient/Caregiver agrees with plan       Evette Georges [phone] 04/14/2013, 3:31 PM

## 2013-04-14 NOTE — Progress Notes (Signed)
Physical Therapy Treatment Patient Details Name: Virginia [name] MRN: [id number] DOB: 1946/07/12 Today's Date: 04/14/2013 Time: 1308-6578 PT Time Calculation (min): 18 min  PT Assessment / Plan / Recommendation Comments on Treatment Session  Patient making great progress and very motivating. Wants to go home today. MD aware    Follow Up Recommendations  Supervision/Assistance - 24 hour     Does the patient have the potential to tolerate intense rehabilitation     Barriers to Discharge        Equipment Recommendations  None recommended by PT    Recommendations for Other Services    Frequency Min 5X/week   Plan Discharge plan remains appropriate;Frequency remains appropriate    Precautions / Restrictions Precautions Precautions: Back;Fall   Pertinent Vitals/Pain     Mobility  Bed Mobility Bed Mobility: Not assessed Details for Bed Mobility Assistance: Patient states she is getting out of bed well with assistance from her husband Transfers Sit to Stand: 5: Supervision;With upper extremity assist;From chair/3-in-1 Stand to Sit: 5: Supervision;With upper extremity assist;To chair/3-in-1 Details for Transfer Assistance: Cues for safe hand placement Ambulation/Gait Ambulation/Gait Assistance: 5: Supervision Ambulation Distance (Feet): 200 Feet Assistive device: Rolling walker Ambulation/Gait Assistance Details: Patient with safe use of RW and good posture throughout Gait Pattern: Step-through pattern Stairs: Yes Stairs Assistance: 4: Min assist Stairs Assistance Details (indicate cue type and reason): Cues for technique. HHA on  Stair Management Technique: One rail Right;Forwards Number of Stairs: 2    Exercises     PT Diagnosis:    PT Problem List:   PT Treatment Interventions:     PT Goals Acute Rehab PT Goals PT Goal: Ambulate - Progress: Progressing toward goal PT Goal: Up/Down Stairs - Progress: Progressing toward goal Additional Goals PT Goal: Additional  Goal #1 - Progress: Progressing toward goal  Visit Information  Last PT Received On: 04/14/13 Assistance Needed: +1    Subjective Data      Cognition  Cognition Arousal/Alertness: Awake/alert Behavior During Therapy: WFL for tasks assessed/performed Overall Cognitive Status: Within Functional Limits for tasks assessed    Balance     End of Session PT - End of Session Equipment Utilized During Treatment: Gait belt Activity Tolerance: Patient tolerated treatment well Patient left: in chair;with call bell/phone within reach Nurse Communication: Mobility status   GP     Fredrich Birks 04/14/2013, 9:20 AM 04/14/2013 Fredrich Birks PTA [phone] pager [phone] office

## 2013-04-15 LAB — TYPE AND SCREEN
Unit division: 0
Unit division: 0

## 2013-04-15 LAB — GLUCOSE, CAPILLARY: Glucose-Capillary: 107 mg/dL — ABNORMAL HIGH (ref 70–99)

## 2013-04-15 MED ORDER — HYDROMORPHONE HCL 4 MG PO TABS: 4.0000 mg | ORAL_TABLET | Freq: Four times a day (QID) | ORAL | Status: DC | PRN

## 2013-04-15 MED ORDER — OXYCODONE-ACETAMINOPHEN 10-325 MG PO TABS: 1.0000 | ORAL_TABLET | ORAL | Status: DC | PRN

## 2013-04-15 NOTE — Care Management Note (Signed)
    Page 1 of 1   04/15/2013     1:13:44 PM   CARE MANAGEMENT NOTE 04/15/2013  Patient:  Virginia [name], Virginia [name]   Account Number:  [id number]  Date Initiated:  04/13/2013  Documentation initiated by:  Vance Peper  Subjective/Objective Assessment:   67 yr old female s/p L4-L5 decompression     Action/Plan:   CM spoke with patient concerning home health and DME needs at discharge. Choice offered.   Anticipated DC Date:  04/15/2013   Anticipated DC Plan:  HOME W HOME HEALTH SERVICES         Choice offered to / List presented to:             [facility] agency  [facility]   Status of service:  Completed, signed off Medicare Important Message given?   (If response is "NO", the following Medicare IM given date fields will be blank) Date Medicare IM given:   Date Additional Medicare IM given:    Discharge Disposition:  HOME/SELF CARE  Per UR Regulation:  Reviewed for med. necessity/level of care/duration of stay  If discussed at Long Length of Stay Meetings, dates discussed:    Comments:  04/15/13- 1315- Donn Pierini RN, BSN [phone] Per PT/OT notes no recommendations made for [facility] f/u at discharge- there was also no recommendations for any DME- pt had stated that she had what she needed. No HH or DME orders for discharge. Pt discharged home with spouse.

## 2013-04-15 NOTE — Progress Notes (Signed)
Subjective: 3 Days Post-Op Procedure(s) (LRB): LUMBAR DECOMPRESSION L4-5, PEDICLE SCREW INSTRUMENTATION, LATERAL MASS FUSION, ALLOGRAFT AND AUTO GRAFT BONE (N/A) Patient reports pain as 3 on 0-10 scaleDoing very well. She did have a Bowel Movement. Will DC..    Objective: Vital signs in last 24 hours: Temp:  [99 F (37.2 C)-99.2 F (37.3 C)] 99 F (37.2 C) (05/18 0637) Pulse Rate:  [77-90] 77 (05/18 0637) Resp:  [18] 18 (05/18 0637) BP: (150-152)/(58-65) 152/58 mmHg (05/18 0637) SpO2:  [95 %-98 %] 95 % (05/18 0637) Weight:  [104.101 kg (229 lb 8 oz)] 104.101 kg (229 lb 8 oz) (05/18 0700)  Intake/Output from previous day: 05/17 0701 - 05/18 0700 In: 1440 [P.O.:1440] Out: -  Intake/Output this shift:     Recent Labs  04/12/13 1809 04/13/13 0410  HGB 10.5* 10.0*    Recent Labs  04/12/13 1809 04/13/13 0410  WBC  --  13.3*  RBC  --  3.25*  HCT 31.0* 29.3*  PLT  --  192    Recent Labs  04/12/13 1809 04/13/13 0410  NA 136 135  K 4.4 4.0  CL  --  99  CO2  --  27  BUN  --  12  CREATININE  --  0.68  GLUCOSE 215* 190*  CALCIUM  --  8.2*   No results found for this basename: LABPT, INR,  in the last 72 hours  Dorsiflexion/Plantar flexion intact  Assessment/Plan: 3 Days Post-Op Procedure(s) (LRB): LUMBAR DECOMPRESSION L4-5, PEDICLE SCREW INSTRUMENTATION, LATERAL MASS FUSION, ALLOGRAFT AND AUTO GRAFT BONE (N/A) Discharge home with home health  Raychel Dowler A 04/15/2013, 10:36 AM

## 2013-04-15 NOTE — Progress Notes (Signed)
Patient had large BM - no further laxatives needed.

## 2013-04-16 NOTE — Discharge Summary (Signed)
Physician Discharge Summary   Patient ID: Virginia [name] MRN: [id number] DOB/AGE: Oct 28, 1946 67 y.o.  Admit date: 04/12/2013 Discharge date: 04/16/2013  Primary Diagnosis:   SPINAL STENOSIS/SPONDYLOTHISIS  Admission Diagnoses:  Past Medical History  Diagnosis Date  . Hypertension   . Stroke     behind rt eye / visual defficiet rt eye  . Spinal stenosis of lumbar region   . Arthritis   . Difficulty sleeping     due to back pain  . Diabetes mellitus without complication     fasting 120-130s  . History of blood transfusion    Discharge Diagnoses:   Active Problems:   Spondylolisthesis   Spinal stenosis of lumbar region  Procedure:  Procedure(s) (LRB): LUMBAR DECOMPRESSION L4-5, PEDICLE SCREW INSTRUMENTATION, LATERAL MASS FUSION, ALLOGRAFT AND AUTO GRAFT BONE (N/A)   Consults: None  HPI:  see H&P    Laboratory Data: Hospital Outpatient Visit on 04/05/2013  Component Date Value Range Status  . Sodium 04/05/2013 138  135 - 145 mEq/L Final  . Potassium 04/05/2013 4.1  3.5 - 5.1 mEq/L Final  . Chloride 04/05/2013 101  96 - 112 mEq/L Final  . CO2 04/05/2013 30  19 - 32 mEq/L Final  . Glucose, Bld 04/05/2013 114* 70 - 99 mg/dL Final  . BUN 08/65/7846 13  6 - 23 mg/dL Final  . Creatinine, Ser 04/05/2013 0.71  0.50 - 1.10 mg/dL Final  . Calcium 96/29/5284 9.0  8.4 - 10.5 mg/dL Final  . GFR calc non Af Amer 04/05/2013 89* >90 mL/min Final  . GFR calc Af Amer 04/05/2013 >90  >90 mL/min Final   Comment:                                 The eGFR has been calculated                          using the CKD EPI equation.                          This calculation has not been                          validated in all clinical                          situations.                          eGFR's persistently                          <90 mL/min signify                          possible Chronic Kidney Disease.  . WBC 04/05/2013 9.6  4.0 - 10.5 K/uL Final  . RBC 04/05/2013 4.31   3.87 - 5.11 MIL/uL Final  . Hemoglobin 04/05/2013 13.4  12.0 - 15.0 g/dL Final  . HCT 13/24/4010 38.1  36.0 - 46.0 % Final  . MCV 04/05/2013 88.4  78.0 - 100.0 fL Final  . MCH 04/05/2013 31.1  26.0 - 34.0 pg Final  . MCHC 04/05/2013 35.2  30.0 - 36.0 g/dL Final  . RDW 27/25/3664 12.9  11.5 - 15.5 % Final  . Platelets 04/05/2013 237  150 - 400 K/uL Final  . ABO/RH(D) 04/05/2013 A NEG   Final  . Antibody Screen 04/05/2013 NEG   Final  . Sample Expiration 04/05/2013 04/19/2013   Final  . Unit Number 04/05/2013 [id number]   Final  . Blood Component Type 04/05/2013 RBC LR PHER2   Final  . Unit division 04/05/2013 00   Final  . Status of Unit 04/05/2013 REL FROM [facility]   Final  . Transfusion Status 04/05/2013 OK TO TRANSFUSE   Final  . Crossmatch Result 04/05/2013 Compatible   Final  . Unit Number 04/05/2013 [id number]   Final  . Blood Component Type 04/05/2013 RED CELLS,LR   Final  . Unit division 04/05/2013 00   Final  . Status of Unit 04/05/2013 REL FROM [facility]   Final  . Transfusion Status 04/05/2013 OK TO TRANSFUSE   Final  . Crossmatch Result 04/05/2013 Compatible   Final  . MRSA, PCR 04/05/2013 POSITIVE* NEGATIVE Final  . Staphylococcus aureus 04/05/2013 POSITIVE* NEGATIVE Final   Comment:                                 The Xpert SA Assay (FDA                          approved for NASAL specimens                          in patients over 45 years of age),                          is one component of                          a comprehensive surveillance                          program.  Test performance has                          been validated by [employer] for patients greater                          than or equal to 20 year old.                          It is not intended                          to diagnose infection nor to                          guide or monitor treatment.   No results found for this basename: HGB,  in the last 72  hours No results found for this basename: WBC, RBC, HCT, PLT,  in the last 72 hours No results found for this basename: NA, K, CL, CO2, BUN, CREATININE, GLUCOSE, CALCIUM,  in the last 72 hours  No results found for this basename: LABPT, INR,  in the last 72 hours  X-Rays:Dg Lumbar Spine 2-3 Views  04/12/2013   *RADIOLOGY REPORT*  Clinical Data: L4-5 fusion.  DG C-ARM GT 120 MIN,LUMBAR SPINE - 2-3 VIEW  Fluoroscopic time:  3 minutes and 22 seconds.  Comparison:  04/05/2013  Findings: Two intraoperative C-arm views submitted for review after surgery.  This reveals fusion L4-5 with bilateral pedicle screws and posterior connecting bar. No obvious complication. Minimal anterior slip of L4.  IMPRESSION: Fusion L4-5.   Original Report Authenticated By: Lacy Duverney, M.D.   Dg Lumbar Spine 2-3 Views  04/05/2013   *RADIOLOGY REPORT*  Clinical Data: Preop lumbar decompression.  LUMBAR SPINE - 2-3 VIEW  Comparison: 01/25/2013  Findings: The lowest disc space is assigned L5-S1 consistent with the prior study.  Grade 1 anterior slip L4-5 shows slight progression from the prior study.  There is extensive facet degeneration at L4-5.  Remaining disc spaces are intact.  Negative for fracture.  IMPRESSION: Grade 1 slip L4-5 with disc and facet degeneration.  No acute bony abnormality.   Original Report Authenticated By: Janeece Riggers, M.D.   Dg C-arm Gt 120 Min  04/12/2013   *RADIOLOGY REPORT*  Clinical Data: L4-5 fusion.  DG C-ARM GT 120 MIN,LUMBAR SPINE - 2-3 VIEW  Fluoroscopic time:  3 minutes and 22 seconds.  Comparison:  04/05/2013  Findings: Two intraoperative C-arm views submitted for review after surgery.  This reveals fusion L4-5 with bilateral pedicle screws and posterior connecting bar. No obvious complication. Minimal anterior slip of L4.  IMPRESSION: Fusion L4-5.   Original Report Authenticated By: Lacy Duverney, M.D.    EKG: Orders placed during the hospital encounter of 01/31/13  . EKG     Hospital  Course: Patient was admitted to [facility] and taken to the OR and underwent the above state procedure without complications.  Patient tolerated the procedure well and was later transferred to the recovery room and then to the orthopaedic floor for postoperative care.  They were given PO and IV analgesics for pain control following their surgery.  They were given 24 hours of postoperative antibiotics.   PT was consulted postop to assist with mobility and transfers.  The patient was allowed to be WBAT with therapy and was taught back precautions. Discharge planning was consulted to help with postop disposition and equipment needs.  Patient had a good night on the evening of surgery and started to get up OOB with therapy on day one. Patient was seen in rounds and was ready to go home on day three.  They were given discharge instructions and dressing directions.  They were instructed on when to follow up in the office with Dr. Shelle Iron.  Discharge Medications: Prior to Admission medications   Medication Sig Start Date End Date Taking? Authorizing Provider  aspirin EC 81 MG tablet Take 81 mg by mouth daily. 02/01/13  Yes Dayna Barker. Jaylin Benzel, PA-C  citalopram (CELEXA) 20 MG tablet Take 20 mg by mouth at bedtime.   Yes Historical Provider, MD  gabapentin (NEURONTIN) 300 MG capsule Take 900 mg by mouth 3 (three) times daily.    Yes Historical Provider, MD  glipiZIDE (GLUCOTROL) 10 MG tablet Take 10 mg by mouth 2 (two) times daily before a meal.   Yes Historical Provider, MD  insulin glargine (LANTUS) 100 UNIT/ML injection Inject 50 Units into the skin 2 (two) times daily.   Yes Historical Provider, MD  insulin lispro (HUMALOG) 100  UNIT/ML injection Inject 10 Units into the skin 3 (three) times daily before meals.   Yes Historical Provider, MD  lisinopril (PRINIVIL,ZESTRIL) 20 MG tablet Take 20 mg by mouth daily.   Yes Historical Provider, MD  methocarbamol (ROBAXIN) 500 MG tablet Take 500 mg by mouth every 8  (eight) hours as needed (pain/spasms). 02/01/13  Yes Dayna Barker. Alandis Bluemel, PA-C  HYDROmorphone (DILAUDID) 4 MG tablet Take 1 tablet (4 mg total) by mouth every 6 (six) hours as needed for pain. 04/15/13   Amber Tamala Ser, PA-C  ibuprofen (ADVIL,MOTRIN) 200 MG tablet Take 600 mg by mouth every 6 (six) hours as needed for pain.    Historical Provider, MD  oxyCODONE-acetaminophen (PERCOCET) 10-325 MG per tablet Take 1-2 tablets by mouth every 4 (four) hours as needed for pain. 04/15/13   Amber Tamala Ser, PA-C    Diet: Diabetic diet Activity:WBAT Follow-up:in 10-14 days Disposition - Home Discharged Condition: good   Discharge Orders   Future Orders Complete By Expires     Call MD / Call 911  As directed     Comments:      If you experience chest pain or shortness of breath, CALL 911 and be transported to the hospital emergency room.  If you develope a fever above 101 F, pus (white drainage) or increased drainage or redness at the wound, or calf pain, call your surgeon's office.    Constipation Prevention  As directed     Comments:      Drink plenty of fluids.  Prune juice may be helpful.  You may use a stool softener, such as Colace (over the counter) 100 mg twice a day.  Use MiraLax (over the counter) for constipation as needed.    Diet Carb Modified  As directed     Driving restrictions  As directed     Comments:      No driving    Increase activity slowly as tolerated  As directed     Lifting restrictions  As directed     Comments:      No lifting        Medication List    TAKE these medications       aspirin EC 81 MG tablet  Take 81 mg by mouth daily.     citalopram 20 MG tablet  Commonly known as:  CELEXA  Take 20 mg by mouth at bedtime.     gabapentin 300 MG capsule  Commonly known as:  NEURONTIN  Take 900 mg by mouth 3 (three) times daily.     glipiZIDE 10 MG tablet  Commonly known as:  GLUCOTROL  Take 10 mg by mouth 2 (two) times daily before a meal.      HYDROmorphone 4 MG tablet  Commonly known as:  DILAUDID  Take 1 tablet (4 mg total) by mouth every 6 (six) hours as needed for pain.     ibuprofen 200 MG tablet  Commonly known as:  ADVIL,MOTRIN  Take 600 mg by mouth every 6 (six) hours as needed for pain.     insulin glargine 100 UNIT/ML injection  Commonly known as:  LANTUS  Inject 50 Units into the skin 2 (two) times daily.     insulin lispro 100 UNIT/ML injection  Commonly known as:  HUMALOG  Inject 10 Units into the skin 3 (three) times daily before meals.     lisinopril 20 MG tablet  Commonly known as:  PRINIVIL,ZESTRIL  Take 20 mg by mouth daily.  methocarbamol 500 MG tablet  Commonly known as:  ROBAXIN  Take 500 mg by mouth every 8 (eight) hours as needed (pain/spasms).     oxyCODONE-acetaminophen 10-325 MG per tablet  Commonly known as:  PERCOCET  Take 1-2 tablets by mouth every 4 (four) hours as needed for pain.           Follow-up Information   Follow up with BEANE,JEFFREY C, MD In 2 weeks.   Contact information:   [address] [place] [address] [phone]       Signed: Dorothy Spark. 04/16/2013, 3:53 PM

## 2013-04-23 NOTE — Progress Notes (Signed)
PT is recommending home with HH and not SNF.  Clinical Social Worker will sign off for now as social work intervention is no longer needed. Please consult us again if new need arises.   Almeda Ezra, MSW [phone] 

## 2013-05-11 ENCOUNTER — Ambulatory Visit (HOSPITAL_COMMUNITY)
Admission: RE | Admit: 2013-05-11 | Discharge: 2013-05-11 | Disposition: A | Discharge status: Home / Self Care | Payer: Medicare Other | Source: Ambulatory Visit | Attending: Cardiovascular Disease | Admitting: Cardiovascular Disease

## 2013-05-11 ENCOUNTER — Other Ambulatory Visit: Payer: Self-pay | Admitting: Specialist

## 2013-05-11 ENCOUNTER — Other Ambulatory Visit (HOSPITAL_COMMUNITY): Payer: Self-pay | Admitting: Orthopedic Surgery

## 2013-05-11 ENCOUNTER — Ambulatory Visit
Admission: RE | Admit: 2013-05-11 | Discharge: 2013-05-11 | Disposition: A | Discharge status: Home / Self Care | Payer: Medicare Other | Source: Ambulatory Visit | Attending: Specialist | Admitting: Specialist

## 2013-05-11 DIAGNOSIS — M7989 Other specified soft tissue disorders: Secondary | ICD-10-CM | POA: Insufficient documentation

## 2013-05-11 DIAGNOSIS — M549 Dorsalgia, unspecified: Secondary | ICD-10-CM

## 2013-05-11 DIAGNOSIS — M79609 Pain in unspecified limb: Secondary | ICD-10-CM | POA: Insufficient documentation

## 2013-05-11 DIAGNOSIS — Z981 Arthrodesis status: Secondary | ICD-10-CM

## 2013-05-11 NOTE — Progress Notes (Signed)
Venous Duplex Imaging - Limited Reatha Sur 

## 2013-10-19 ENCOUNTER — Encounter (INDEPENDENT_AMBULATORY_CARE_PROVIDER_SITE_OTHER): Payer: Self-pay | Admitting: Ophthalmology

## 2013-12-19 ENCOUNTER — Encounter (HOSPITAL_COMMUNITY): Payer: Self-pay | Admitting: Emergency Medicine

## 2013-12-19 ENCOUNTER — Inpatient Hospital Stay (HOSPITAL_COMMUNITY): Payer: Medicare HMO

## 2013-12-19 ENCOUNTER — Emergency Department (HOSPITAL_COMMUNITY): Payer: Medicare HMO

## 2013-12-19 ENCOUNTER — Inpatient Hospital Stay (HOSPITAL_COMMUNITY)
Admission: EM | Admit: 2013-12-19 | Discharge: 2013-12-22 | DRG: 149 | Disposition: A | Discharge status: Home Health | Payer: Medicare HMO | Attending: Internal Medicine | Admitting: Internal Medicine

## 2013-12-19 DIAGNOSIS — S025XXA Fracture of tooth (traumatic), initial encounter for closed fracture: Secondary | ICD-10-CM

## 2013-12-19 DIAGNOSIS — E119 Type 2 diabetes mellitus without complications: Secondary | ICD-10-CM | POA: Diagnosis present

## 2013-12-19 DIAGNOSIS — T426X1A Poisoning by other antiepileptic and sedative-hypnotic drugs, accidental (unintentional), initial encounter: Secondary | ICD-10-CM

## 2013-12-19 DIAGNOSIS — I959 Hypotension, unspecified: Secondary | ICD-10-CM | POA: Diagnosis present

## 2013-12-19 DIAGNOSIS — R42 Dizziness and giddiness: Principal | ICD-10-CM | POA: Diagnosis present

## 2013-12-19 DIAGNOSIS — G510 Bell's palsy: Secondary | ICD-10-CM | POA: Diagnosis present

## 2013-12-19 DIAGNOSIS — N390 Urinary tract infection, site not specified: Secondary | ICD-10-CM | POA: Diagnosis present

## 2013-12-19 DIAGNOSIS — R0902 Hypoxemia: Secondary | ICD-10-CM | POA: Diagnosis present

## 2013-12-19 DIAGNOSIS — G8929 Other chronic pain: Secondary | ICD-10-CM | POA: Diagnosis present

## 2013-12-19 DIAGNOSIS — Y92009 Unspecified place in unspecified non-institutional (private) residence as the place of occurrence of the external cause: Secondary | ICD-10-CM

## 2013-12-19 DIAGNOSIS — W19XXXA Unspecified fall, initial encounter: Secondary | ICD-10-CM

## 2013-12-19 DIAGNOSIS — M129 Arthropathy, unspecified: Secondary | ICD-10-CM | POA: Diagnosis present

## 2013-12-19 DIAGNOSIS — Z7982 Long term (current) use of aspirin: Secondary | ICD-10-CM

## 2013-12-19 DIAGNOSIS — I6350 Cerebral infarction due to unspecified occlusion or stenosis of unspecified cerebral artery: Secondary | ICD-10-CM | POA: Diagnosis present

## 2013-12-19 DIAGNOSIS — Z8673 Personal history of transient ischemic attack (TIA), and cerebral infarction without residual deficits: Secondary | ICD-10-CM

## 2013-12-19 DIAGNOSIS — T4275XA Adverse effect of unspecified antiepileptic and sedative-hypnotic drugs, initial encounter: Secondary | ICD-10-CM | POA: Diagnosis present

## 2013-12-19 DIAGNOSIS — D649 Anemia, unspecified: Secondary | ICD-10-CM | POA: Diagnosis present

## 2013-12-19 DIAGNOSIS — M48061 Spinal stenosis, lumbar region without neurogenic claudication: Secondary | ICD-10-CM

## 2013-12-19 DIAGNOSIS — Z794 Long term (current) use of insulin: Secondary | ICD-10-CM

## 2013-12-19 DIAGNOSIS — M48062 Spinal stenosis, lumbar region with neurogenic claudication: Secondary | ICD-10-CM

## 2013-12-19 DIAGNOSIS — I10 Essential (primary) hypertension: Secondary | ICD-10-CM | POA: Diagnosis present

## 2013-12-19 DIAGNOSIS — E860 Dehydration: Secondary | ICD-10-CM | POA: Diagnosis present

## 2013-12-19 DIAGNOSIS — G589 Mononeuropathy, unspecified: Secondary | ICD-10-CM | POA: Diagnosis present

## 2013-12-19 DIAGNOSIS — N179 Acute kidney failure, unspecified: Secondary | ICD-10-CM | POA: Diagnosis present

## 2013-12-19 DIAGNOSIS — R270 Ataxia, unspecified: Secondary | ICD-10-CM | POA: Diagnosis present

## 2013-12-19 DIAGNOSIS — I639 Cerebral infarction, unspecified: Secondary | ICD-10-CM

## 2013-12-19 DIAGNOSIS — M4310 Spondylolisthesis, site unspecified: Secondary | ICD-10-CM

## 2013-12-19 HISTORY — DX: Personal history of other diseases of the nervous system and sense organs: Z86.69

## 2013-12-19 LAB — POCT I-STAT, CHEM 8
BUN: 104 mg/dL — ABNORMAL HIGH (ref 6–23)
CHLORIDE: 99 meq/L (ref 96–112)
Calcium, Ion: 1.12 mmol/L — ABNORMAL LOW (ref 1.13–1.30)
Creatinine, Ser: 2.9 mg/dL — ABNORMAL HIGH (ref 0.50–1.10)
Glucose, Bld: 139 mg/dL — ABNORMAL HIGH (ref 70–99)
HEMATOCRIT: 36 % (ref 36.0–46.0)
Hemoglobin: 12.2 g/dL (ref 12.0–15.0)
Potassium: 5.2 mEq/L (ref 3.7–5.3)
Sodium: 132 mEq/L — ABNORMAL LOW (ref 137–147)
TCO2: 22 mmol/L (ref 0–100)

## 2013-12-19 LAB — CBC
HEMATOCRIT: 34.9 % — AB (ref 36.0–46.0)
Hemoglobin: 11.7 g/dL — ABNORMAL LOW (ref 12.0–15.0)
MCH: 31 pg (ref 26.0–34.0)
MCHC: 33.5 g/dL (ref 30.0–36.0)
MCV: 92.6 fL (ref 78.0–100.0)
PLATELETS: 254 10*3/uL (ref 150–400)
RBC: 3.77 MIL/uL — ABNORMAL LOW (ref 3.87–5.11)
RDW: 14.2 % (ref 11.5–15.5)
WBC: 10.1 10*3/uL (ref 4.0–10.5)

## 2013-12-19 LAB — URINALYSIS, ROUTINE W REFLEX MICROSCOPIC
GLUCOSE, UA: NEGATIVE mg/dL
HGB URINE DIPSTICK: NEGATIVE
KETONES UR: NEGATIVE mg/dL
Nitrite: NEGATIVE
PH: 5 (ref 5.0–8.0)
Protein, ur: NEGATIVE mg/dL
Specific Gravity, Urine: 1.023 (ref 1.005–1.030)
Urobilinogen, UA: 0.2 mg/dL (ref 0.0–1.0)

## 2013-12-19 LAB — COMPREHENSIVE METABOLIC PANEL
ALT: 16 U/L (ref 0–35)
AST: 29 U/L (ref 0–37)
Albumin: 3.2 g/dL — ABNORMAL LOW (ref 3.5–5.2)
Alkaline Phosphatase: 76 U/L (ref 39–117)
BILIRUBIN TOTAL: 0.4 mg/dL (ref 0.3–1.2)
BUN: 84 mg/dL — ABNORMAL HIGH (ref 6–23)
CHLORIDE: 95 meq/L — AB (ref 96–112)
CO2: 21 meq/L (ref 19–32)
Calcium: 8 mg/dL — ABNORMAL LOW (ref 8.4–10.5)
Creatinine, Ser: 2.73 mg/dL — ABNORMAL HIGH (ref 0.50–1.10)
GFR calc Af Amer: 20 mL/min — ABNORMAL LOW (ref >90)
GFR, EST NON AFRICAN AMERICAN: 17 mL/min — AB (ref >90)
Glucose, Bld: 139 mg/dL — ABNORMAL HIGH (ref 70–99)
POTASSIUM: 5.3 meq/L (ref 3.7–5.3)
SODIUM: 132 meq/L — AB (ref 137–147)
Total Protein: 6.8 g/dL (ref 6.0–8.3)

## 2013-12-19 LAB — GLUCOSE, CAPILLARY
GLUCOSE-CAPILLARY: 75 mg/dL (ref 70–99)
GLUCOSE-CAPILLARY: 215 mg/dL — AB (ref 70–99)
Glucose-Capillary: 115 mg/dL — ABNORMAL HIGH (ref 70–99)
Glucose-Capillary: 123 mg/dL — ABNORMAL HIGH (ref 70–99)

## 2013-12-19 LAB — RAPID URINE DRUG SCREEN, HOSP PERFORMED
AMPHETAMINES: NOT DETECTED
BARBITURATES: NOT DETECTED
Benzodiazepines: NOT DETECTED
COCAINE: NOT DETECTED
Opiates: POSITIVE — AB
TETRAHYDROCANNABINOL: NOT DETECTED

## 2013-12-19 LAB — DIFFERENTIAL
BASOS ABS: 0 10*3/uL (ref 0.0–0.1)
Basophils Relative: 0 % (ref 0–1)
Eosinophils Absolute: 0.6 10*3/uL (ref 0.0–0.7)
Eosinophils Relative: 6 % — ABNORMAL HIGH (ref 0–5)
LYMPHS PCT: 33 % (ref 12–46)
Lymphs Abs: 3.3 10*3/uL (ref 0.7–4.0)
Monocytes Absolute: 0.9 10*3/uL (ref 0.1–1.0)
Monocytes Relative: 9 % (ref 3–12)
NEUTROS ABS: 5.4 10*3/uL (ref 1.7–7.7)
NEUTROS PCT: 53 % (ref 43–77)

## 2013-12-19 LAB — PROTIME-INR
INR: 1.11 (ref 0.00–1.49)
PROTHROMBIN TIME: 14.1 s (ref 11.6–15.2)

## 2013-12-19 LAB — URINE MICROSCOPIC-ADD ON

## 2013-12-19 LAB — POCT I-STAT TROPONIN I: Troponin i, poc: 0.01 ng/mL (ref 0.00–0.08)

## 2013-12-19 LAB — APTT: APTT: 22 s — AB (ref 24–37)

## 2013-12-19 LAB — PRO B NATRIURETIC PEPTIDE: Pro B Natriuretic peptide (BNP): 19.4 pg/mL (ref 0–125)

## 2013-12-19 LAB — ETHANOL

## 2013-12-19 LAB — TROPONIN I: Troponin I: <0.3 ng/mL (ref <0.30)

## 2013-12-19 LAB — MRSA PCR SCREENING: MRSA by PCR: NEGATIVE

## 2013-12-19 MED ORDER — ENOXAPARIN SODIUM 40 MG/0.4ML ~~LOC~~ SOLN
40.0000 mg | SUBCUTANEOUS | Status: DC
  Administered 2013-12-20 – 2013-12-21 (×2): 40 mg via SUBCUTANEOUS
  Filled 2013-12-19 (×4): qty 0.4

## 2013-12-19 MED ORDER — ONDANSETRON HCL 4 MG PO TABS: 4.0000 mg | ORAL_TABLET | Freq: Four times a day (QID) | ORAL | Status: DC | PRN

## 2013-12-19 MED ORDER — INSULIN ASPART 100 UNIT/ML ~~LOC~~ SOLN: 0.0000 [IU] | Freq: Every day | SUBCUTANEOUS | Status: DC

## 2013-12-19 MED ORDER — TECHNETIUM TO 99M ALBUMIN AGGREGATED
6.0000 | Freq: Once | INTRAVENOUS | Status: AC | PRN
  Administered 2013-12-19: 6 via INTRAVENOUS

## 2013-12-19 MED ORDER — INSULIN GLARGINE 100 UNIT/ML ~~LOC~~ SOLN
40.0000 [IU] | Freq: Two times a day (BID) | SUBCUTANEOUS | Status: DC
  Administered 2013-12-19 – 2013-12-21 (×5): 40 [IU] via SUBCUTANEOUS
  Filled 2013-12-19 (×7): qty 0.4

## 2013-12-19 MED ORDER — DIPHENHYDRAMINE HCL 25 MG PO CAPS: 25.0000 mg | ORAL_CAPSULE | Freq: Every evening | ORAL | Status: DC | PRN

## 2013-12-19 MED ORDER — SODIUM CHLORIDE 0.9 % IJ SOLN
3.0000 mL | Freq: Two times a day (BID) | INTRAMUSCULAR | Status: DC
  Administered 2013-12-20 – 2013-12-22 (×3): 3 mL via INTRAVENOUS

## 2013-12-19 MED ORDER — ONDANSETRON HCL 4 MG/2ML IJ SOLN
4.0000 mg | Freq: Four times a day (QID) | INTRAMUSCULAR | Status: DC | PRN
  Administered 2013-12-21: 4 mg via INTRAVENOUS
  Filled 2013-12-19: qty 2

## 2013-12-19 MED ORDER — INSULIN ASPART 100 UNIT/ML ~~LOC~~ SOLN
0.0000 [IU] | Freq: Three times a day (TID) | SUBCUTANEOUS | Status: DC
  Administered 2013-12-20 (×2): 2 [IU] via SUBCUTANEOUS
  Administered 2013-12-21: 1 [IU] via SUBCUTANEOUS

## 2013-12-19 MED ORDER — GABAPENTIN 400 MG PO CAPS
1200.0000 mg | ORAL_CAPSULE | Freq: Three times a day (TID) | ORAL | Status: DC
  Administered 2013-12-19: 1200 mg via ORAL
  Filled 2013-12-19 (×6): qty 3

## 2013-12-19 MED ORDER — ASPIRIN 325 MG PO TABS
325.0000 mg | ORAL_TABLET | Freq: Every day | ORAL | Status: DC
  Administered 2013-12-20 – 2013-12-22 (×3): 325 mg via ORAL
  Filled 2013-12-19 (×3): qty 1

## 2013-12-19 MED ORDER — CITALOPRAM HYDROBROMIDE 20 MG PO TABS
20.0000 mg | ORAL_TABLET | Freq: Every day | ORAL | Status: DC
  Administered 2013-12-19 – 2013-12-21 (×3): 20 mg via ORAL
  Filled 2013-12-19 (×5): qty 1

## 2013-12-19 MED ORDER — ALBUTEROL SULFATE (2.5 MG/3ML) 0.083% IN NEBU: 2.5000 mg | INHALATION_SOLUTION | RESPIRATORY_TRACT | Status: DC | PRN

## 2013-12-19 MED ORDER — MORPHINE SULFATE ER 15 MG PO TBCR
60.0000 mg | EXTENDED_RELEASE_TABLET | Freq: Two times a day (BID) | ORAL | Status: DC
  Administered 2013-12-19: 60 mg via ORAL
  Filled 2013-12-19: qty 4

## 2013-12-19 MED ORDER — ACETAMINOPHEN 325 MG PO TABS
650.0000 mg | ORAL_TABLET | Freq: Four times a day (QID) | ORAL | Status: DC | PRN
Start: 2013-12-19 — End: 2013-12-22

## 2013-12-19 MED ORDER — SODIUM CHLORIDE 0.9 % IV BOLUS (SEPSIS)
500.0000 mL | Freq: Once | INTRAVENOUS | Status: AC
  Administered 2013-12-19: 500 mL via INTRAVENOUS

## 2013-12-19 MED ORDER — TECHNETIUM TC 99M DIETHYLENETRIAME-PENTAACETIC ACID: 40.0000 | Freq: Once | INTRAVENOUS | Status: AC | PRN

## 2013-12-19 MED ORDER — MORPHINE SULFATE 15 MG PO TABS
15.0000 mg | ORAL_TABLET | Freq: Four times a day (QID) | ORAL | Status: DC | PRN
  Administered 2013-12-21: 15 mg via ORAL
  Filled 2013-12-19: qty 1

## 2013-12-19 MED ORDER — ACETAMINOPHEN 650 MG RE SUPP
650.0000 mg | Freq: Four times a day (QID) | RECTAL | Status: DC | PRN
Start: 2013-12-19 — End: 2013-12-22

## 2013-12-19 MED ORDER — SODIUM CHLORIDE 0.9 % IV SOLN
INTRAVENOUS | Status: AC
  Administered 2013-12-19: 18:00:00 via INTRAVENOUS

## 2013-12-19 MED ORDER — ZOLPIDEM TARTRATE 5 MG PO TABS: 5.0000 mg | ORAL_TABLET | Freq: Every evening | ORAL | Status: DC | PRN

## 2013-12-19 NOTE — ED Provider Notes (Signed)
CSN: [id number]     Arrival date & time 12/19/13  0919 History  First MD Initiated Contact with Patient 12/19/13 [phone]     Chief Complaint  Patient presents with  . Dizziness  . Fall  . Nausea   HPI It initially started about a week ago but then worsened over the last three to four days.  It is constant and worsening.  She has a sensation that the room is moving and she is having trouble walking.  Her balance and coordination are off and she does not feel like her feet are moving properly.  She tried to get up this am and ended up falling breaking one of her front teeth.  No LOC.  No other injuries in the fall.  No neck pain.  No pain in her extremities.  No headache.    No shortness of breath.  Questionable slurred speech.  No focal weakness in arms or legs. Past Medical History  Diagnosis Date  . Hypertension   . Stroke     behind rt eye / visual defficiet rt eye  . Spinal stenosis of lumbar region   . Arthritis   . Difficulty sleeping     due to back pain  . Diabetes mellitus without complication     fasting 120-130s  . History of blood transfusion    Past Surgical History  Procedure Laterality Date  . Cholecystectomy    . Abdominal hysterectomy    . Oophorectomy    . Bladder surgery      bladder tack  . Cervical spine surgery      cervical fusion  . Lumbar laminectomy/decompression microdiscectomy N/A 01/31/2013    Procedure: MICRO LUMBAR DECOMPRESSION L4-5;  Surgeon: Javier Docker, MD;  Location: [facility];  Service: Orthopedics;  Laterality: N/A;  Bilateral Decompression L4-L5   No family history on file. History  Substance Use Topics  . Smoking status: Never Smoker   . Smokeless tobacco: Not on file  . Alcohol Use: No   OB History   Grav Para Term Preterm Abortions TAB SAB Ect Mult Living                 Review of Systems  Constitutional: Negative for fever.  Respiratory: Negative for shortness of breath.   Cardiovascular: Negative for chest pain.  All other  systems reviewed and are negative.    Allergies  Shellfish allergy  Home Medications   Current Outpatient Rx  Name  Route  Sig  Dispense  Refill  . acetaminophen (TYLENOL) 500 MG tablet   Oral   Take 1,000 mg by mouth every 6 (six) hours as needed for moderate pain or headache.         Marland Kitchen aspirin 325 MG tablet   Oral   Take 325 mg by mouth daily.         . citalopram (CELEXA) 20 MG tablet   Oral   Take 20 mg by mouth at bedtime.         . DiphenhydrAMINE HCl (BENADRYL PO)   Oral   Take 2 capsules by mouth at bedtime as needed (sleep).         . gabapentin (NEURONTIN) 300 MG capsule   Oral   Take 1,200 mg by mouth 3 (three) times daily.          Marland Kitchen glipiZIDE (GLUCOTROL) 5 MG tablet   Oral   Take 5 mg by mouth 2 (two) times daily before a meal.         .  hydrochlorothiazide (HYDRODIURIL) 25 MG tablet   Oral   Take 25 mg by mouth daily.         . insulin glargine (LANTUS) 100 UNIT/ML injection   Subcutaneous   Inject 50 Units into the skin 2 (two) times daily.         . insulin lispro (HUMALOG) 100 UNIT/ML injection   Subcutaneous   Inject 10-20 Units into the skin 3 (three) times daily before meals. Blood sugar below 150 use 10 units  If above 150 use 20 units         . lisinopril (PRINIVIL,ZESTRIL) 40 MG tablet   Oral   Take 40 mg by mouth daily.         Marland Kitchen morphine (MS CONTIN) 60 MG 12 hr tablet   Oral   Take 60 mg by mouth every 12 (twelve) hours.         Marland Kitchen morphine (MSIR) 15 MG tablet   Oral   Take 15 mg by mouth every 6 (six) hours as needed (pain).         Marland Kitchen OVER THE COUNTER MEDICATION   Both Eyes   Place 1 drop into both eyes daily as needed (dry eyes). Eye drops for dry eyes          BP 91/46  Pulse 76  Resp 18  SpO2 90% Physical Exam  Nursing note and vitals reviewed. Constitutional: She is oriented to person, place, and time. She appears well-developed and well-nourished. No distress.  Obese   HENT:  Head:  Normocephalic and atraumatic.  Right Ear: External ear normal.  Left Ear: External ear normal.  Mouth/Throat: Oropharynx is clear and moist.  Fractured tooth, right upper central incisor, no pulp or bleeding  Eyes: Conjunctivae are normal. Right eye exhibits no discharge. Left eye exhibits no discharge. No scleral icterus.  Neck: Neck supple. No tracheal deviation present.  Cardiovascular: Normal rate, regular rhythm and intact distal pulses.   Pulmonary/Chest: Effort normal and breath sounds normal. No stridor. No respiratory distress. She has no wheezes. She has no rales.  Abdominal: Soft. Bowel sounds are normal. She exhibits no distension. There is no tenderness. There is no rebound and no guarding.  Musculoskeletal: She exhibits no edema and no tenderness.  Neurological: She is alert and oriented to person, place, and time. She has normal strength. No cranial nerve deficit (no facial droop, extraocular movements intact, no slurred speech) or sensory deficit. She exhibits normal muscle tone. She displays no seizure activity. Coordination normal.  No pronator drift bilateral upper extrem, able to hold both legs off bed for 5 seconds, sensation intact in all extremities, ? Visual field defect right temporal, no left or right sided neglect, abnormal finger-nose exam , horizontal nystagmus  Skin: Skin is warm and dry. No rash noted.  Psychiatric: She has a normal mood and affect.    ED Course  Procedures (including critical care time) Labs Review Labs Reviewed  APTT - Abnormal; Notable for the following:    aPTT 22 (*)    All other components within normal limits  CBC - Abnormal; Notable for the following:    RBC 3.77 (*)    Hemoglobin 11.7 (*)    HCT 34.9 (*)    All other components within normal limits  DIFFERENTIAL - Abnormal; Notable for the following:    Eosinophils Relative 6 (*)    All other components within normal limits  COMPREHENSIVE METABOLIC PANEL - Abnormal; Notable for  the following:  Sodium 132 (*)    Chloride 95 (*)    Glucose, Bld 139 (*)    BUN 84 (*)    Creatinine, Ser 2.73 (*)    Calcium 8.0 (*)    Albumin 3.2 (*)    GFR calc non Af Amer 17 (*)    GFR calc Af Amer 20 (*)    All other components within normal limits  GLUCOSE, CAPILLARY - Abnormal; Notable for the following:    Glucose-Capillary 115 (*)    All other components within normal limits  POCT I-STAT, CHEM 8 - Abnormal; Notable for the following:    Sodium 132 (*)    BUN 104 (*)    Creatinine, Ser 2.90 (*)    Glucose, Bld 139 (*)    Calcium, Ion 1.12 (*)    All other components within normal limits  ETHANOL  PROTIME-INR  TROPONIN I  PRO B NATRIURETIC PEPTIDE  URINE RAPID DRUG SCREEN (HOSP PERFORMED)  URINALYSIS, ROUTINE W REFLEX MICROSCOPIC  POCT I-STAT TROPONIN I   Imaging Review Dg Chest 2 View  12/19/2013   CLINICAL DATA:  Dizziness, fall, shortness of breath.  EXAM: CHEST  2 VIEW  COMPARISON:  PA and lateral chest 01/25/2013.  FINDINGS: The lungs are clear. Heart size is normal. There is no pneumothorax or pleural effusion. No focal bony abnormality is identified.  IMPRESSION: No acute disease.  Stable compared to prior exam.   Electronically Signed   By: Drusilla Kanner M.D.   On: 12/19/2013 10:53   Ct Head Wo Contrast  12/19/2013   CLINICAL DATA:  History of fall with injury to the face.  EXAM: CT HEAD WITHOUT CONTRAST  TECHNIQUE: Contiguous axial images were obtained from the base of the skull through the vertex without intravenous contrast.  COMPARISON:  No priors.  FINDINGS: No acute displaced skull fractures are identified. No acute intracranial abnormality. Specifically, no evidence of acute post-traumatic intracranial hemorrhage, no definite regions of acute/subacute cerebral ischemia, no focal mass, mass effect, hydrocephalus or abnormal intra or extra-axial fluid collections. The visualized paranasal sinuses and mastoids are well pneumatized.  IMPRESSION: 1. No acute  displaced skull fractures or acute intracranial abnormalities. 2. The appearance of the brain is normal.   Electronically Signed   By: Trudie Reed M.D.   On: 12/19/2013 10:50    EKG Interpretation    Date/Time:  Wednesday December 19 2013 10:23:15 EST Ventricular Rate:  73 PR Interval:  160 QRS Duration: 85 QT Interval:  392 QTC Calculation: 432 R Axis:   -17 Text Interpretation:  Sinus rhythm Borderline left axis deviation No significant change since last tracing Confirmed by Rayfield Beem  MD-J, Kalib Bhagat ([id number]) on 12/19/2013 10:29:30 AM            MDM   1. Vertigo   2. Acute renal failure   3. Hypoxia   4. Tooth fracture    The patient's vertigo symptoms are concerning for possible stroke. She does have focal abnormalities on her exam specifically with her visual field in finger-nose exam. This suggests a possible cerebellar stroke.  Patient remained stable. I have ordered an MRI of the brain for further evaluation. The patient will be admitted to the hospital for further treatment and evaluation.  Patient does have acute renal failure compared to prior labs. It is possible this could be related to her ACE inhibitor. I will give her IV fluids although she has not had any complaints of vomiting and diarrhea.  Patient is hypoxic in  the emergency department. Unclear etiology of this. He is asymptomatic. Chest x-ray is unremarkable. I will order a V/Q scan to assess for possible pulmonary embolism.  The patient does have a dentist or fracture. This can be routinely followed up as an outpatient with a dentist.    Celene KrasJon R Zaccheus Edmister, MD 12/19/13 1251

## 2013-12-19 NOTE — ED Notes (Signed)
Patient transported to [facility] 

## 2013-12-19 NOTE — ED Notes (Signed)
Pt states she's had dizziness and nausea since Saturday getting progressively worse.  This morning pt was standing up from chair and fell forward striking face on floor.  Pt broke a tooth.  Redness noted to lip.  Pt denies LOC.  Pt denies any head pain.  Pt O2 sat upon arrival 86%.  O2 at 2L administered via [place].  Pt alert and oriented.  Neuro intact.

## 2013-12-19 NOTE — Consult Note (Addendum)
NEURO HOSPITALIST CONSULT NOTE    Reason for Consult:Vertigo  HPI:                                                                                                                                          Virginia [name] is an 68 y.o. female who was brought the ED after she and husband noted a 4 day decline in her energy, some difficulty with her balance when walking and today having a syncopal episode which resulted in a fall and breaking her front tooth.  Her husband noted that 4 days ago she would be slightly off balance when initially standing up but would ambulate without difficulty.  Over the course of a few days he has had to assist her with ambulation due to her "being wobbly on her feet".  Patient noted that she would feel as though both legs were going to give out and at times would feel the room was spinning to the left.  She states she is unclear what would provoke the vertigo but clearly states she is off balance when getting up.  She also felt she would lean more to the left. She denies any weakness other than bilateral LE, facial numbness, dysarthria, blurred or double vision. On arrival to the ED BUN/ Cr 84-104/2.73-2.90, UA showed moderate leukocytes, labile BP with SBP as low as 80/50.  Due to desaturation of oxygen while in ED patient was sent to obtain a V/Q scan to evaluate for PE.   Past Medical History  Diagnosis Date  . Hypertension   . Stroke     behind rt eye / visual defficiet rt eye  . Spinal stenosis of lumbar region   . Arthritis   . Difficulty sleeping     due to back pain  . Diabetes mellitus without complication     fasting 120-130s  . History of blood transfusion   . H/O Bell's palsy left    Past Surgical History  Procedure Laterality Date  . Cholecystectomy    . Abdominal hysterectomy    . Oophorectomy    . Bladder surgery      bladder tack  . Cervical spine surgery      cervical fusion  . Lumbar laminectomy/decompression  microdiscectomy N/A 01/31/2013    Procedure: MICRO LUMBAR DECOMPRESSION L4-5;  Surgeon: Javier DockerJeffrey C Beane, MD;  Location: [facility];  Service: Orthopedics;  Laterality: N/A;  Bilateral Decompression L4-L5  . Cholecystectomy open N/A   . Vaginal hysterectomy      Family History  Problem Relation Age of Onset  . Kidney failure Sister     Social History:  reports that she has never smoked. She does not have any smokeless tobacco history on file. She reports that she does not drink alcohol  or use illicit drugs.  Allergies  Allergen Reactions  . Shellfish Allergy Anaphylaxis    MEDICATIONS:                                                                                                                     Prior to Admission:  Prescriptions prior to admission  Medication Sig Dispense Refill  . acetaminophen (TYLENOL) 500 MG tablet Take 1,000 mg by mouth every 6 (six) hours as needed for moderate pain or headache.      Marland Kitchen aspirin 325 MG tablet Take 325 mg by mouth daily.      . citalopram (CELEXA) 20 MG tablet Take 20 mg by mouth at bedtime.      . DiphenhydrAMINE HCl (BENADRYL PO) Take 2 capsules by mouth at bedtime as needed (sleep).      . gabapentin (NEURONTIN) 300 MG capsule Take 1,200 mg by mouth 3 (three) times daily.       Marland Kitchen glipiZIDE (GLUCOTROL) 5 MG tablet Take 5 mg by mouth 2 (two) times daily before a meal.      . hydrochlorothiazide (HYDRODIURIL) 25 MG tablet Take 25 mg by mouth daily.      . insulin glargine (LANTUS) 100 UNIT/ML injection Inject 50 Units into the skin 2 (two) times daily.      . insulin lispro (HUMALOG) 100 UNIT/ML injection Inject 10-20 Units into the skin 3 (three) times daily before meals. Blood sugar below 150 use 10 units  If above 150 use 20 units      . lisinopril (PRINIVIL,ZESTRIL) 40 MG tablet Take 40 mg by mouth daily.      Marland Kitchen morphine (MS CONTIN) 60 MG 12 hr tablet Take 60 mg by mouth every 12 (twelve) hours.      Marland Kitchen morphine (MSIR) 15 MG tablet Take 15 mg by  mouth every 6 (six) hours as needed (pain).      Marland Kitchen OVER THE COUNTER MEDICATION Place 1 drop into both eyes daily as needed (dry eyes). Eye drops for dry eyes       Scheduled: . aspirin  325 mg Oral Daily  . citalopram  20 mg Oral QHS  . enoxaparin (LOVENOX) injection  40 mg Subcutaneous Q24H  . gabapentin  1,200 mg Oral TID  . insulin aspart  0-5 Units Subcutaneous QHS  . insulin aspart  0-9 Units Subcutaneous TID WC  . insulin glargine  40 Units Subcutaneous BID  . morphine  60 mg Oral Q12H  . sodium chloride  3 mL Intravenous Q12H   Continuous: . sodium chloride     AVW:[id number], acetaminophen, albuterol, diphenhydrAMINE, morphine, ondansetron (ZOFRAN) IV, ondansetron, technetium TC 80M diethylenetriame-pentaacetic acid   ROS:  History obtained from the patient  General ROS: negative for - chills, fatigue, fever, night sweats, weight gain or weight loss Psychological ROS: negative for - behavioral disorder, hallucinations, memory difficulties, mood swings or suicidal ideation Ophthalmic ROS: negative for - blurry vision, double vision, eye pain or loss of vision ENT ROS: negative for - epistaxis, nasal discharge, oral lesions, sore throat, tinnitus or vertigo Allergy and Immunology ROS: negative for - hives or itchy/watery eyes Hematological and Lymphatic ROS: negative for - bleeding problems, bruising or swollen lymph nodes Endocrine ROS: negative for - galactorrhea, hair pattern changes, polydipsia/polyuria or temperature intolerance Respiratory ROS: negative for - cough, hemoptysis, shortness of breath or wheezing Cardiovascular ROS: negative for - chest pain, dyspnea on exertion, edema or irregular heartbeat Gastrointestinal ROS: negative for - abdominal pain, diarrhea, hematemesis, nausea/vomiting or stool incontinence Genito-Urinary ROS:  negative for - dysuria, hematuria, incontinence or urinary frequency/urgency Musculoskeletal ROS: negative for - joint swelling or muscular weakness Neurological ROS: as noted in HPI Dermatological ROS: negative for rash and skin lesion changes   Blood pressure 94/45, pulse 78, temperature 98.5 F (36.9 C), resp. rate 17, SpO2 95.00%.   Neurologic Examination:                                                                                                      Mental Status: Alert, oriented, thought content appropriate.  Speech fluent without evidence of aphasia.  Able to follow 3 step commands without difficulty. Cranial Nerves: II: Discs flat bilaterally; Visual fields grossly normal, pupils equal, round, reactive to light and accommodation III,IV, VI: ptosis not present, extra-ocular motions intact bilaterally, no nystagmus noted with Virginia [name] V,VII: smile asymmetric on the right (history of bell's palsy on the right), facial light touch sensation normal bilaterally VIII: hearing normal bilaterally IX,X: gag reflex present XI: bilateral shoulder shrug XII: midline tongue extension without atrophy or fasciculations  Motor: Right : Upper extremity   5/5    Left:     Upper extremity   5/5  Lower extremity   5/5     Lower extremity   5/5 --positive asterixis Tone and bulk:normal tone throughout; no atrophy noted Sensory: Pinprick and light touch intact throughout, bilaterally Deep Tendon Reflexes:  Right: Upper Extremity   Left: Upper extremity   biceps (C-5 to C-6) 2/4   biceps (C-5 to C-6) 2/4 tricep (C7) 2/4    triceps (C7) 2/4 Brachioradialis (C6) 2/4  Brachioradialis (C6) 2/4  Lower Extremity Lower Extremity  quadriceps (L-2 to L-4) 2/4   quadriceps (L-2 to L-4) 2/4 Achilles (S1) 1/4   Achilles (S1) 1/4  Plantars: Right: downgoing   Left: downgoing Cerebellar: normal finger-to-nose,  normal heel-to-shin test (limited due to body habitus) Gait: not tested due to  multiple leads.  CV: pulses palpable throughout    Lab Results: Basic Metabolic Panel:  Recent Labs Lab 12/19/13 1021 12/19/13 1034  NA 132* 132*  K 5.3 5.2  CL 95* 99  CO2 21  --   GLUCOSE 139* 139*  BUN 84* 104*  CREATININE 2.73* 2.90*  CALCIUM 8.0*  --     Liver Function Tests:  Recent Labs Lab 12/19/13 1021  AST 29  ALT 16  ALKPHOS 76  BILITOT 0.4  PROT 6.8  ALBUMIN 3.2*   No results found for this basename: LIPASE, AMYLASE,  in the last 168 hours No results found for this basename: AMMONIA,  in the last 168 hours  CBC:  Recent Labs Lab 12/19/13 1021 12/19/13 1034  WBC 10.1  --   NEUTROABS 5.4  --   HGB 11.7* 12.2  HCT 34.9* 36.0  MCV 92.6  --   PLT 254  --     Cardiac Enzymes:  Recent Labs Lab 12/19/13 1021  TROPONINI <0.30    Lipid Panel: No results found for this basename: CHOL, TRIG, HDL, CHOLHDL, VLDL, LDLCALC,  in the last 168 hours  CBG:  Recent Labs Lab 12/19/13 1128  GLUCAP 115*    Microbiology: Results for orders placed during the hospital encounter of 04/05/13  SURGICAL PCR SCREEN     Status: Abnormal   Collection Time    04/05/13 10:14 AM      Result Value Range Status   MRSA, PCR POSITIVE (*) NEGATIVE Final   Staphylococcus aureus POSITIVE (*) NEGATIVE Final   Comment:            The Xpert SA Assay (FDA     approved for NASAL specimens     in patients over 73 years of age),     is one component of     a comprehensive surveillance     program.  Test performance has     been validated by [employer] for patients greater     than or equal to 30 year old.     It is not intended     to diagnose infection nor to     guide or monitor treatment.    Coagulation Studies:  Recent Labs  12/19/13 1021  LABPROT 14.1  INR 1.11    Imaging: Dg Chest 2 View  12/19/2013   CLINICAL DATA:  Dizziness, fall, shortness of breath.  EXAM: CHEST  2 VIEW  COMPARISON:  PA and lateral chest 01/25/2013.  FINDINGS: The lungs  are clear. Heart size is normal. There is no pneumothorax or pleural effusion. No focal bony abnormality is identified.  IMPRESSION: No acute disease.  Stable compared to prior exam.   Electronically Signed   By: Drusilla Kanner M.D.   On: 12/19/2013 10:53   Ct Head Wo Contrast  12/19/2013   CLINICAL DATA:  History of fall with injury to the face.  EXAM: CT HEAD WITHOUT CONTRAST  TECHNIQUE: Contiguous axial images were obtained from the base of the skull through the vertex without intravenous contrast.  COMPARISON:  No priors.  FINDINGS: No acute displaced skull fractures are identified. No acute intracranial abnormality. Specifically, no evidence of acute post-traumatic intracranial hemorrhage, no definite regions of acute/subacute cerebral ischemia, no focal mass, mass effect, hydrocephalus or abnormal intra or extra-axial fluid collections. The visualized paranasal sinuses and mastoids are well pneumatized.  IMPRESSION: 1. No acute displaced skull fractures or acute intracranial abnormalities. 2. The appearance of the brain is normal.   Electronically Signed   By: Trudie Reed M.D.   On: 12/19/2013 10:50       Assessment and plan per attending neurologist  Felicie Morn PA-C [facility] [phone]  12/19/2013, 3:52 PM   Assessment/Plan: I have seen and evaluated the patient. I  have reviewed the above note and made appropriate changes.   68 YO female with 4 day history of declining strength, increased difficulty with ambulation and periods of vertiginous sensations. In ED exam is essential non focal with exception of left NL fold decrease due to history of bell's palsy.  She is dehydrated with BUN/Cr 104/2.9, UA showing leukocytes and labile BP dropping into the 80's. Although her symptoms likely multifactorial, with multiple risk factors of DM. HTN and CVA I do think that getting an MRI to exclude ischemia would be prudent.  I suspect that her difficulty walking is  multifactorial including asterixis, lightheadedness due to her multiple medical problems.  1) MRI brain 2) if negative, then no further workup needed. 3) neurology will sign off. If MRI is positive, will sign back on.    Ritta Slot, MD [facility] [phone]  If 7pm- 7am, please page neurology on call at [phone].

## 2013-12-19 NOTE — H&P (Signed)
History and Physical  Virginia GalaKay C Fallas ZOX:[id number] DOB: 10/03/1946 DOA: 12/19/2013  Referring physician: EDP PCP: Selinda FlavinHOWARD, KEVIN, MD  Outpatient Specialists:  1. None  Chief Complaint: Fall, dizziness  HPI: Virginia [name] is a 68 y.o. female with history of IDDM, hypertension, remote stroke with right eye blindness, left Bell's palsy with facial asymmetry, chronic back pain on opioids, presented to the ED with complaints of dizziness and a fall this morning. Patient states that she has had intermittent dizziness which she describes as "world spinning around" for approximately 2 months but did not bother her significantly. Her symptoms seem to have worsened in the last 4 days. This vertigo seems to occur both when she's lying down and worse in upright position and ambulation. She denies headache, earache, tinnitus or sore throat. It progressively got worse where she had to ambulate with the help of holding on to furniture and walls at home. Her appetite has decreased but she denies abdominal pain or diarrhea. She had a single episode of vomiting 2 days back. She denies fever, chills, chest pain, palpitations, cough or dyspnea. She has chronic back pain that has not changed. Her symptoms seem to have even worse and this morning when she tried to get up and use the bathroom, felt dizzy and fell face forward and chipped a tooth. She denies loss of consciousness. Her husband noticed slurred speech but no asymmetric weakness. He states that 2 days ago she was walking around like she was "drunk". In the ED, patient was hypotensive, hypoxic, CT head negative for acute findings, creatinine 2.73 and hemoglobin 11.7. Chest x-ray negative for acute findings. Hospitalist admission requested.  Review of Systems: All systems reviewed and apart from history of presenting illness, are negative.  Past Medical History  Diagnosis Date  . Hypertension   . Stroke     behind rt eye / visual defficiet rt eye  .  Spinal stenosis of lumbar region   . Arthritis   . Difficulty sleeping     due to back pain  . Diabetes mellitus without complication     fasting 120-130s  . History of blood transfusion   . H/O Bell's palsy left   Past Surgical History  Procedure Laterality Date  . Cholecystectomy    . Abdominal hysterectomy    . Oophorectomy    . Bladder surgery      bladder tack  . Cervical spine surgery      cervical fusion  . Lumbar laminectomy/decompression microdiscectomy N/A 01/31/2013    Procedure: MICRO LUMBAR DECOMPRESSION L4-5;  Surgeon: Javier DockerJeffrey C Beane, MD;  Location: [facility];  Service: Orthopedics;  Laterality: N/A;  Bilateral Decompression L4-L5  . Cholecystectomy open N/A   . Vaginal hysterectomy     Social History:  reports that she has never smoked. She does not have any smokeless tobacco history on file. She reports that she does not drink alcohol or use illicit drugs. Married. Usually independent effect of it is of daily living.  Allergies  Allergen Reactions  . Shellfish Allergy Anaphylaxis    Family History  Problem Relation Age of Onset  . Kidney failure Sister     Prior to Admission medications   Medication Sig Start Date End Date Taking? Authorizing Provider  acetaminophen (TYLENOL) 500 MG tablet Take 1,000 mg by mouth every 6 (six) hours as needed for moderate pain or headache.   Yes Historical Provider, MD  aspirin 325 MG tablet Take 325 mg by mouth daily.  Yes Historical Provider, MD  citalopram (CELEXA) 20 MG tablet Take 20 mg by mouth at bedtime.   Yes Historical Provider, MD  DiphenhydrAMINE HCl (BENADRYL PO) Take 2 capsules by mouth at bedtime as needed (sleep).   Yes Historical Provider, MD  gabapentin (NEURONTIN) 300 MG capsule Take 1,200 mg by mouth 3 (three) times daily.    Yes Historical Provider, MD  glipiZIDE (GLUCOTROL) 5 MG tablet Take 5 mg by mouth 2 (two) times daily before a meal.   Yes Historical Provider, MD  hydrochlorothiazide (HYDRODIURIL) 25  MG tablet Take 25 mg by mouth daily.   Yes Historical Provider, MD  insulin glargine (LANTUS) 100 UNIT/ML injection Inject 50 Units into the skin 2 (two) times daily.   Yes Historical Provider, MD  insulin lispro (HUMALOG) 100 UNIT/ML injection Inject 10-20 Units into the skin 3 (three) times daily before meals. Blood sugar below 150 use 10 units  If above 150 use 20 units   Yes Historical Provider, MD  lisinopril (PRINIVIL,ZESTRIL) 40 MG tablet Take 40 mg by mouth daily.   Yes Historical Provider, MD  morphine (MS CONTIN) 60 MG 12 hr tablet Take 60 mg by mouth every 12 (twelve) hours.   Yes Historical Provider, MD  morphine (MSIR) 15 MG tablet Take 15 mg by mouth every 6 (six) hours as needed (pain).   Yes Historical Provider, MD  OVER THE COUNTER MEDICATION Place 1 drop into both eyes daily as needed (dry eyes). Eye drops for dry eyes   Yes Historical Provider, MD   Physical Exam: Filed Vitals:   12/19/13 1322 12/19/13 1330 12/19/13 1400 12/19/13 1401  BP:  109/78 97/48 97/48   Pulse:  76 78 78  Temp: 98.5 F (36.9 C)     Resp:  9 11 17   SpO2:  94% 94% 95%     General exam: Moderately built and obese female patient, lying comfortably supine on the gurney in no obvious distress.  Head, eyes and ENT: Nontraumatic and normocephalic. Pupils equally reacting to light and accommodation. Oral mucosa dry. Partially chipped right upper tooth  Neck: Supple. No JVD, carotid bruit or thyromegaly.  Lymphatics: No lymphadenopathy.  Respiratory system: Clear to auscultation. No increased work of breathing.  Cardiovascular system: S1 and S2 heard, RRR. No JVD, murmurs, gallops, clicks or pedal edema.  Gastrointestinal system: Abdomen is nondistended, soft and nontender. Normal bowel sounds heard. No organomegaly or masses appreciated. Right-sided laparotomy scar.  Central nervous system: Alert and oriented. Diminished left nasolabial fold (chronic from prior Bell's palsy). Positive past pointing  right greater than left. No other focal deficits.  Extremities: Symmetric 5 x 5 power. Peripheral pulses symmetrically felt.   Skin: No rashes or acute findings.  Musculoskeletal system: Negative exam.  Psychiatry: Pleasant and cooperative.   Labs on Admission:  Basic Metabolic Panel:  Recent Labs Lab 12/19/13 1021 12/19/13 1034  NA 132* 132*  K 5.3 5.2  CL 95* 99  CO2 21  --   GLUCOSE 139* 139*  BUN 84* 104*  CREATININE 2.73* 2.90*  CALCIUM 8.0*  --    Liver Function Tests:  Recent Labs Lab 12/19/13 1021  AST 29  ALT 16  ALKPHOS 76  BILITOT 0.4  PROT 6.8  ALBUMIN 3.2*   No results found for this basename: LIPASE, AMYLASE,  in the last 168 hours No results found for this basename: AMMONIA,  in the last 168 hours CBC:  Recent Labs Lab 12/19/13 1021 12/19/13 1034  WBC 10.1  --  NEUTROABS 5.4  --   HGB 11.7* 12.2  HCT 34.9* 36.0  MCV 92.6  --   PLT 254  --    Cardiac Enzymes:  Recent Labs Lab 12/19/13 1021  TROPONINI <0.30    BNP (last 3 results)  Recent Labs  12/19/13 1021  PROBNP 19.4   CBG:  Recent Labs Lab 12/19/13 1128  GLUCAP 115*    Radiological Exams on Admission: Dg Chest 2 View  12/19/2013   CLINICAL DATA:  Dizziness, fall, shortness of breath.  EXAM: CHEST  2 VIEW  COMPARISON:  PA and lateral chest 01/25/2013.  FINDINGS: The lungs are clear. Heart size is normal. There is no pneumothorax or pleural effusion. No focal bony abnormality is identified.  IMPRESSION: No acute disease.  Stable compared to prior exam.   Electronically Signed   By: Drusilla Kanner M.D.   On: 12/19/2013 10:53   Ct Head Wo Contrast  12/19/2013   CLINICAL DATA:  History of fall with injury to the face.  EXAM: CT HEAD WITHOUT CONTRAST  TECHNIQUE: Contiguous axial images were obtained from the base of the skull through the vertex without intravenous contrast.  COMPARISON:  No priors.  FINDINGS: No acute displaced skull fractures are identified. No acute  intracranial abnormality. Specifically, no evidence of acute post-traumatic intracranial hemorrhage, no definite regions of acute/subacute cerebral ischemia, no focal mass, mass effect, hydrocephalus or abnormal intra or extra-axial fluid collections. The visualized paranasal sinuses and mastoids are well pneumatized.  IMPRESSION: 1. No acute displaced skull fractures or acute intracranial abnormalities. 2. The appearance of the brain is normal.   Electronically Signed   By: Trudie Reed M.D.   On: 12/19/2013 10:50    EKG: Independently reviewed. Sinus rhythm, possible left axis deviation but no acute findings he  Assessment/Plan Active Problems:   Vertigo   Dehydration   Acute renal failure   Anemia   Fall at home   Ataxia   Hypotension   Hypertension   Diabetes mellitus without complication   Chronic pain   Hypoxia   Vertigo/gait ataxia/fall and chipped tooth - DD: Hypotension, metabolic abnormalities, rule out posterior circulation/cerebellar CVA - Admit to telemetry. Check MRI and MRA brain without contrast.  - Neurology consulted: Neuro checks every 2 hours, bedside and swallow screen and start aspirin. - PT and OT evaluation.  Dehydration/hypotension - Secondary to poor oral intake, diuretics and antihypertensives - Hold culprit medications and aggressively hydrate with IV fluids and monitor.  Acute renal failure - Secondary to dehydration, diuretics and ACE inhibitors. - Treatment as above and trend daily BMP.  Hypoxia - Unclear etiology. No acute findings on chest x-ray and no respiratory symptoms. - As per spouse, patient does have history of snoring and apneic spells at night. - DD: OSA/OHS. No history of recent travel. Followup VQ scan. - Oxygen supplements and monitor.  IDDM - Well slightly reduce home dose of Lantus and add SSI. Hold oral hypoglycemics secondary to worsening renal functions and potential for hypoglycemia.  Chronic back pain - Continue home  opioids.  Anemia - Follow CBC in a.m.     Code Status: Full  Family Communication: Discussed with spouse at bedside.  Disposition Plan: Home when medically stable   Time spent: 65 minutes  Samaa Ueda, MD, FACP, FHM. [facility] Pager [phone]  If 7PM-7AM, please contact night-coverage [email or web address] Password [facility] 12/19/2013, 2:47 PM

## 2013-12-19 NOTE — ED Notes (Signed)
Patient's blood pressure is 80's/50's started a 500cc bolus per Dr. Lynelle DoctorKnapp.  I also called nuclear med and advised them that I would call when her blood pressure was stable.

## 2013-12-19 NOTE — ED Notes (Signed)
Family at bedside. 

## 2013-12-19 NOTE — ED Notes (Signed)
CBG: 115 

## 2013-12-20 ENCOUNTER — Ambulatory Visit (HOSPITAL_COMMUNITY): Payer: Medicare HMO

## 2013-12-20 ENCOUNTER — Inpatient Hospital Stay (HOSPITAL_COMMUNITY): Payer: Medicare HMO

## 2013-12-20 DIAGNOSIS — E119 Type 2 diabetes mellitus without complications: Secondary | ICD-10-CM

## 2013-12-20 DIAGNOSIS — G8929 Other chronic pain: Secondary | ICD-10-CM

## 2013-12-20 DIAGNOSIS — R279 Unspecified lack of coordination: Secondary | ICD-10-CM

## 2013-12-20 LAB — GLUCOSE, CAPILLARY
GLUCOSE-CAPILLARY: 157 mg/dL — AB (ref 70–99)
Glucose-Capillary: 108 mg/dL — ABNORMAL HIGH (ref 70–99)
Glucose-Capillary: 156 mg/dL — ABNORMAL HIGH (ref 70–99)

## 2013-12-20 LAB — BASIC METABOLIC PANEL
BUN: 84 mg/dL — ABNORMAL HIGH (ref 6–23)
CO2: 19 meq/L (ref 19–32)
Calcium: 7.8 mg/dL — ABNORMAL LOW (ref 8.4–10.5)
Chloride: 100 mEq/L (ref 96–112)
Creatinine, Ser: 2.24 mg/dL — ABNORMAL HIGH (ref 0.50–1.10)
GFR calc Af Amer: 25 mL/min — ABNORMAL LOW (ref >90)
GFR, EST NON AFRICAN AMERICAN: 22 mL/min — AB (ref >90)
GLUCOSE: 134 mg/dL — AB (ref 70–99)
POTASSIUM: 5.4 meq/L — AB (ref 3.7–5.3)
Sodium: 134 mEq/L — ABNORMAL LOW (ref 137–147)

## 2013-12-20 LAB — TSH: TSH: 0.706 u[IU]/mL (ref 0.350–4.500)

## 2013-12-20 LAB — CBC
HCT: 31.7 % — ABNORMAL LOW (ref 36.0–46.0)
HEMOGLOBIN: 10.7 g/dL — AB (ref 12.0–15.0)
MCH: 30.7 pg (ref 26.0–34.0)
MCHC: 33.8 g/dL (ref 30.0–36.0)
MCV: 91.1 fL (ref 78.0–100.0)
Platelets: 215 10*3/uL (ref 150–400)
RBC: 3.48 MIL/uL — ABNORMAL LOW (ref 3.87–5.11)
RDW: 14.1 % (ref 11.5–15.5)
WBC: 9.9 10*3/uL (ref 4.0–10.5)

## 2013-12-20 LAB — INFLUENZA PANEL BY PCR (TYPE A & B)
H1N1FLUPCR: NOT DETECTED
INFLBPCR: NEGATIVE
Influenza A By PCR: NEGATIVE

## 2013-12-20 LAB — MAGNESIUM: Magnesium: 2.3 mg/dL (ref 1.5–2.5)

## 2013-12-20 MED ORDER — PNEUMOCOCCAL VAC POLYVALENT 25 MCG/0.5ML IJ INJ
0.5000 mL | INJECTION | INTRAMUSCULAR | Status: AC
  Administered 2013-12-21: 0.5 mL via INTRAMUSCULAR
  Filled 2013-12-20: qty 0.5

## 2013-12-20 MED ORDER — INFLUENZA VAC SPLIT QUAD 0.5 ML IM SUSP
0.5000 mL | INTRAMUSCULAR | Status: AC
  Administered 2013-12-21: 0.5 mL via INTRAMUSCULAR
  Filled 2013-12-20: qty 0.5

## 2013-12-20 MED ORDER — DEXTROSE 5 % IV SOLN
1.0000 g | INTRAVENOUS | Status: DC
  Administered 2013-12-20 – 2013-12-21 (×2): 1 g via INTRAVENOUS
  Filled 2013-12-20 (×2): qty 10

## 2013-12-20 MED ORDER — LORAZEPAM 0.5 MG PO TABS
0.5000 mg | ORAL_TABLET | Freq: Four times a day (QID) | ORAL | Status: DC | PRN
  Administered 2013-12-20 – 2013-12-21 (×4): 0.5 mg via ORAL
  Filled 2013-12-20 (×4): qty 1

## 2013-12-20 MED ORDER — MORPHINE SULFATE ER 15 MG PO TBCR
30.0000 mg | EXTENDED_RELEASE_TABLET | Freq: Two times a day (BID) | ORAL | Status: DC
  Administered 2013-12-20: 30 mg via ORAL
  Filled 2013-12-20: qty 2

## 2013-12-20 NOTE — Progress Notes (Addendum)
[facility] PROGRESS NOTE  Virginia [name] [id number] DOB: 23-Jun-1946 DOA: 12/19/2013 PCP: Selinda FlavinHOWARD, KEVIN, MD  Assessment/Plan: Vertigo/gait ataxia/fall and chipped tooth  - DD: Hypotension, metabolic abnormalities, rule out posterior circulation/cerebellar CVA  - tele - MRI and MRA brain without contrast.  - Neurology consulted - ASA - PT and OT evaluation.   Fever -r/o flu -droplet precautions prob UTI- rocephin  Dehydration/hypotension  - Secondary to poor oral intake, diuretics and antihypertensives  - Hold culprit medications and aggressively hydrate with IV fluids and monitor.   Acute renal failure  - Secondary to dehydration, diuretics and ACE inhibitors.  - Treatment as above and trend daily BMP.  -? Baseline -check renal U/s  Hypoxia  - Unclear etiology. No acute findings on chest x-ray and no respiratory symptoms.  - As per spouse, patient does have history of snoring and apneic spells at night.  - DD: OSA/OHS. No history of recent travel.  VQ scan no PE - Oxygen supplements and monitor.   IDDM  - Well slightly reduce home dose of Lantus and add SSI. Hold oral hypoglycemics secondary to worsening renal functions and potential for hypoglycemia.   Chronic back pain  - decrease pain meds as kidney function is worsening        Code Status: full Family Communication: patient Disposition Plan: MRI pending; PT eval   Consultants:  neuro  Procedures:    Antibiotics:    HPI/Subjective: Patient with fevers No myalgias  Objective: Filed Vitals:   12/20/13 0600  BP: 106/50  Pulse: 94  Temp: 100.3 F (37.9 C)  Resp: 18    Intake/Output Summary (Last 24 hours) at 12/20/13 1015 Last data filed at 12/20/13 0700  Gross per 24 hour  Intake   1860 ml  Output    400 ml  Net   1460 ml   There were no vitals filed for this visit.  Exam:   General:  A+Ox3, NAD  Cardiovascular: rrr  Respiratory: clear anterior  Abdomen: +BS,  soft  Musculoskeletal: moves all 4 ext   Data Reviewed: Basic Metabolic Panel:  Recent Labs Lab 12/19/13 0042 12/19/13 1021 12/19/13 1034  NA 134* 132* 132*  K 5.4* 5.3 5.2  CL 100 95* 99  CO2 19 21  --   GLUCOSE 134* 139* 139*  BUN 84* 84* 104*  CREATININE 2.24* 2.73* 2.90*  CALCIUM 7.8* 8.0*  --   MG 2.3  --   --    Liver Function Tests:  Recent Labs Lab 12/19/13 1021  AST 29  ALT 16  ALKPHOS 76  BILITOT 0.4  PROT 6.8  ALBUMIN 3.2*   No results found for this basename: LIPASE, AMYLASE,  in the last 168 hours No results found for this basename: AMMONIA,  in the last 168 hours CBC:  Recent Labs Lab 12/19/13 0042 12/19/13 1021 12/19/13 1034  WBC 9.9 10.1  --   NEUTROABS  --  5.4  --   HGB 10.7* 11.7* 12.2  HCT 31.7* 34.9* 36.0  MCV 91.1 92.6  --   PLT 215 254  --    Cardiac Enzymes:  Recent Labs Lab 12/19/13 1021  TROPONINI <0.30   BNP (last 3 results)  Recent Labs  12/19/13 1021  PROBNP 19.4   CBG:  Recent Labs Lab 12/19/13 1128 12/19/13 1718 12/19/13 2111 12/19/13 2316 12/20/13 0642  GLUCAP 115* 75 123* 215* 157*    Recent Results (from the past 240 hour(s))  MRSA PCR SCREENING  Status: None   Collection Time    12/19/13  4:50 PM      Result Value Range Status   MRSA by PCR NEGATIVE  NEGATIVE Final   Comment:            The GeneXpert MRSA Assay (FDA     approved for NASAL specimens     only), is one component of a     comprehensive MRSA colonization     surveillance program. It is not     intended to diagnose MRSA     infection nor to guide or     monitor treatment for     MRSA infections.     Studies: Dg Chest 2 View  12/19/2013   CLINICAL DATA:  Dizziness, fall, shortness of breath.  EXAM: CHEST  2 VIEW  COMPARISON:  PA and lateral chest 01/25/2013.  FINDINGS: The lungs are clear. Heart size is normal. There is no pneumothorax or pleural effusion. No focal bony abnormality is identified.  IMPRESSION: No acute  disease.  Stable compared to prior exam.   Electronically Signed   By: Drusilla Kanner M.D.   On: 12/19/2013 10:53   Ct Head Wo Contrast  12/20/2013   CLINICAL DATA:  Altered mental status; decreased mental acuity.  EXAM: CT HEAD WITHOUT CONTRAST  TECHNIQUE: Contiguous axial images were obtained from the base of the skull through the vertex without intravenous contrast.  COMPARISON:  CT of the head performed 12/19/2013  FINDINGS: There is no evidence of acute infarction, mass lesion, or intra- or extra-axial hemorrhage on CT.  Mild periventricular white matter change likely reflects small vessel ischemic microangiopathy.  The posterior fossa, including the cerebellum, brainstem and fourth ventricle, is within normal limits. The third and lateral ventricles, and basal ganglia are unremarkable in appearance. The cerebral hemispheres are symmetric in appearance, with normal gray-white differentiation. No mass effect or midline shift is seen.  There is no evidence of fracture; visualized osseous structures are unremarkable in appearance. The orbits are within normal limits. The paranasal sinuses and mastoid air cells are well-aerated. No significant soft tissue abnormalities are seen.  IMPRESSION: 1. No acute intracranial pathology seen on CT. 2. Mild small vessel ischemic microangiopathy.   Electronically Signed   By: Roanna Raider M.D.   On: 12/20/2013 00:57   Ct Head Wo Contrast  12/19/2013   CLINICAL DATA:  History of fall with injury to the face.  EXAM: CT HEAD WITHOUT CONTRAST  TECHNIQUE: Contiguous axial images were obtained from the base of the skull through the vertex without intravenous contrast.  COMPARISON:  No priors.  FINDINGS: No acute displaced skull fractures are identified. No acute intracranial abnormality. Specifically, no evidence of acute post-traumatic intracranial hemorrhage, no definite regions of acute/subacute cerebral ischemia, no focal mass, mass effect, hydrocephalus or abnormal  intra or extra-axial fluid collections. The visualized paranasal sinuses and mastoids are well pneumatized.  IMPRESSION: 1. No acute displaced skull fractures or acute intracranial abnormalities. 2. The appearance of the brain is normal.   Electronically Signed   By: Trudie Reed M.D.   On: 12/19/2013 10:50   Nm Pulmonary Perf And Vent  12/19/2013   CLINICAL DATA:  Shortness of breath. History of hypertension, stroke and dizziness appear  EXAM: NUCLEAR MEDICINE VENTILATION - PERFUSION LUNG SCAN  TECHNIQUE: Ventilation images were obtained in multiple projections using inhaled aerosol technetium 99 M DTPA. Perfusion images were obtained in multiple projections after intravenous injection of Tc-58m MAA.  COMPARISON:  Chest radiographs same date.  RADIOPHARMACEUTICALS:  40.0 mCi Tc-49m DTPA aerosol and 6.0 mCi Tc-9m MAA  FINDINGS: Ventilation: There is mild central airway clumping of the aerosol. No focal ventilation defects are demonstrated.  Perfusion: No wedge shaped peripheral perfusion defects to suggest acute pulmonary embolism.  IMPRESSION: Very low probability for acute pulmonary embolism.   Electronically Signed   By: Roxy Horseman M.D.   On: 12/19/2013 16:32    Scheduled Meds: . aspirin  325 mg Oral Daily  . citalopram  20 mg Oral QHS  . enoxaparin (LOVENOX) injection  40 mg Subcutaneous Q24H  . insulin aspart  0-5 Units Subcutaneous QHS  . insulin aspart  0-9 Units Subcutaneous TID WC  . insulin glargine  40 Units Subcutaneous BID  . morphine  30 mg Oral Q12H  . sodium chloride  3 mL Intravenous Q12H   Continuous Infusions: . sodium chloride 150 mL/hr at 12/19/13 2000    Active Problems:   Vertigo   Dehydration   Acute renal failure   Anemia   Fall at home   Ataxia   Hypotension   Hypertension   Diabetes mellitus without complication   Chronic pain   Hypoxia    Time spent: 35 min    Preciosa Bundrick  [facility] Pager [phone]. If 7PM-7AM, please contact  night-coverage at [email or web address], password [facility] 12/20/2013, 10:15 AM  LOS: 1 day

## 2013-12-20 NOTE — Progress Notes (Signed)
[facility] Order to travel without tele.

## 2013-12-20 NOTE — Progress Notes (Signed)
Inpatient Diabetes Program Recommendations  AACE/ADA: New Consensus Statement on Inpatient Glycemic Control (2013)  Target Ranges:  Prepandial:   less than 140 mg/dL      Peak postprandial:   less than 180 mg/dL (1-2 hours)      Critically ill patients:  140 - 180 mg/dL   Reason for Visit: Hyperglycemia  Results for Virginia [name], Virginia [name] (MRN [id number]) as of 12/20/2013 11:18  Ref. Range 12/19/2013 23:16 12/20/2013 06:42  Glucose-Capillary Latest Range: 70-99 mg/dL 409215 (H) 811157 (H)   Check HgbA1C to assess glycemic control prior to hospitalization.  Thank you. Ailene Ardshonda Brendalee Matthies, RD, LDN, CDE Inpatient Diabetes Coordinator [phone]

## 2013-12-20 NOTE — Progress Notes (Signed)
Subjective: Became sedated overnight, now improved.   Exam: Filed Vitals:   12/20/13 1006  BP: 100/56  Pulse: 86  Temp: 99.2 F (37.3 C)  Resp: 18   Gen: In bed, NAD MS: Awake, alert, interactive and appropriate WU:[id number]:PERRL, EOMI Motor: MAEW  Impression: 68 yo F with dizziness in the setting of multiple medical problems. One possibility besides orthostatsis would be gabapentin toxicity. She has a very high dose and her renal function is impaired. She is on it for neuropathy. Would hold it for the time being, consider restarting tomorrow at a reduced dose.   Recommendations: 1) Hold gabapentin.  2) MRI brain pending.   Ritta SlotMcNeill Kirkpatrick, MD [facility] [phone]  If 7pm- 7am, please page neurology on call at [phone].

## 2013-12-20 NOTE — Progress Notes (Signed)
Patient returned to room 4N29 [facility] at this [facility].

## 2013-12-20 NOTE — Evaluation (Signed)
Physical Therapy Evaluation Patient Details Name: Virginia [name] MRN: [id number] DOB: [birth year] Today's Date: 12/20/2013 Time: 3086-5784 PT Time Calculation (min): 48 min  PT Assessment / Plan / Recommendation History of Present Illness  pt presents with 3-4 day hx of confusion, tremors, and weakness.    Clinical Impression  Pt generally weak with difficulty grading movements and some ataxia.  Pt also noted to have difficulty with vision on L side and unable to maintain focus when tracking.  Pt does endorse dizziness with movement and while tracking, though difficult for pt to describe as she seems to be having cognitive deficits and/or word finding problems.  Will continue to follow.      PT Assessment  Patient needs continued PT services    Follow Up Recommendations  CIR    Does the patient have the potential to tolerate intense rehabilitation      Barriers to Discharge        Equipment Recommendations  Rolling walker with 5" wheels    Recommendations for Other Services Rehab consult   Frequency Min 4X/week    Precautions / Restrictions Precautions Precautions: Fall Restrictions Weight Bearing Restrictions: No   Pertinent Vitals/Pain Indicates back pain from bed.        Mobility  Bed Mobility Overal bed mobility: Needs Assistance Bed Mobility: Supine to Sit;Sit to Supine Supine to sit: Mod assist;HOB elevated Sit to supine: Mod assist General bed mobility comments: cues for sequencing and safety.  A to bring trunk up to sitting and for Bil LEs to return to bed.   Transfers Overall transfer level: Needs assistance Equipment used: 1 person hand held assist Transfers: Sit to/from [employer] Sit to Stand: Min assist Stand pivot transfers: Min assist General transfer comment: cues sequencing and safe technique.  pt with tremors during SPT.  Needs hand over hand for use of UEs.   Modified Rankin (Stroke Patients Only) Pre-Morbid Rankin Score: No  significant disability Modified Rankin: Severe disability    Exercises     PT Diagnosis: Difficulty walking;Generalized weakness  PT Problem List: Decreased strength;Decreased activity tolerance;Decreased balance;Decreased mobility;Decreased coordination;Decreased cognition;Decreased knowledge of use of DME;Decreased safety awareness;Decreased knowledge of precautions;Obesity PT Treatment Interventions: DME instruction;Gait training;Stair training;Therapeutic activities;Functional mobility training;Therapeutic exercise;Balance training;Neuromuscular re-education;Patient/family education;Cognitive remediation     PT Goals(Current goals can be found in the care plan section) Acute Rehab PT Goals Patient Stated Goal: None stated PT Goal Formulation: With patient/family Time For Goal Achievement: 01/03/14 Potential to Achieve Goals: Good  Visit Information  Last PT Received On: 12/20/13 Assistance Needed: +2 (for safety and ambulation) History of Present Illness: pt presents with 3-4 day hx of confusion, tremors, and weakness.         Prior Functioning  Home Living Family/patient expects to be discharged to:: Inpatient rehab Prior Function Level of Independence: Independent Comments: Per husband pt was sharp minded and without physical deficits.   Communication Communication: Expressive difficulties Dominant Hand: Right    Cognition  Cognition Arousal/Alertness: Awake/alert Behavior During Therapy: Flat affect Overall Cognitive Status: Impaired/Different from baseline Area of Impairment: Orientation;Attention;Memory;Following commands;Safety/judgement;Awareness;Problem solving Orientation Level: Time Current Attention Level: Sustained Memory: Decreased short-term memory Following Commands: Follows one step commands with increased time Safety/Judgement: Decreased awareness of safety;Decreased awareness of deficits Awareness: Emergent;Anticipatory Problem Solving: Slow  processing;Decreased initiation;Difficulty sequencing;Requires verbal cues;Requires tactile cues General Comments: pt seems to have word finding difficulties and at times simply has a blank stare when asking pt a question.  Needs things repeated.  Extremity/Trunk Assessment Upper Extremity Assessment Upper Extremity Assessment: Defer to OT evaluation Lower Extremity Assessment Lower Extremity Assessment: RLE deficits/detail;LLE deficits/detail RLE Deficits / Details: Generally weak with some ataxia and trouble grading movements.   RLE Coordination: decreased fine motor;decreased gross motor LLE Deficits / Details: Generally weak with some ataxia and trouble grading movements.   LLE Coordination: decreased fine motor;decreased gross motor   Balance Balance Overall balance assessment: Needs assistance Standing balance support: Bilateral upper extremity supported Standing balance-Leahy Scale: Poor General Comments General comments (skin integrity, edema, etc.): pt with baseline visual deficits in R eye from old CVA, however pt with trouble tracking to L in both upper and lower quadrants and indicates increased dizziness.    End of Session PT - End of Session Equipment Utilized During Treatment: Gait belt Activity Tolerance: Patient limited by fatigue Patient left: in bed;with call bell/phone within reach;with bed alarm set;with family/visitor present Nurse Communication: Mobility status (Impaired cognition and visual deficits.  )  GP     Berkley Wrightsman, Alison MurrayMegan F, PT [phone] 12/20/2013, 11:59 AM

## 2013-12-20 NOTE — Progress Notes (Signed)
Rehab Admissions Coordinator Note:  Patient was screened by Jeffie Spivack L for appropriateness for an Inpatient Acute Rehab Consult.  At this time, we are recommending Inpatient Rehab consult.  Brylan Seubert, PT Rehabilitation Admissions Coordinator [phone]  

## 2013-12-20 NOTE — Progress Notes (Signed)
Shift event: RN paged this NP earlier in the shift secondary to pt having a neuro status change. RN says pt was oriented and "normal appearing" about one hour prior to my being called. Now, pt has twitching of extremities and hesitancy of speech with slowness of actions and following commands. RN also felt pt has increased weakness in upper extremities. CBG was in the 200s. I noticed that pt was on narcotics (same as home dose) plus Gabapentin and had recently received the meds. When RN asked, RN states pt just received her meds and the change in status was prior to giving the meds.  NP to bedside. S: Pt is not a good historian at the moment. She can not tell me why she is in the hospital. Husband says at home, she hasn't had this "confusion" or slowness of thoughts/actions even when she takes the same narcotics at home. Husband says that earlier today she was "fairly back to normal", but in past 1-2 hours has had a mental change.  O: Well appearing chronically ill appearing obese WF in NAD. She is alert. Knows she is in the hospital but can't tell me why. Knows that it is January of 2015. However, even the simplest of questions result in slowness of response and lack of answers, even after giving a pt a minute or so to respond. She seems to stare off in space and not maintain eye contact. When asked to follow a command, she will hesitate and sometimes not follow the command at all or follow it wrongly or with a different extremity. On strength exam, I can't appreciate any decrease in strength x 4.  A/P: 1. Change in neuro status/altered mental status-I reviewed previous ROS and PE per [facility] doc and Neuro. Neither document an inability of pt to provide a good history, promptly answer questions, and/or follow commands. Spoke to Dr. Roseanne RenoStewart, neuro, who recommended r/p CT scan head which is negative for acute finding.  Will continue to monitor and continue workup. TSH and some electrolytes are still pending. ?  This could be side effects of meds, ? Narcotics or gabapentin or SSRI. Will leave note for attending in am.  Virginia NormanKaren Kirby-Graham, NP [facility]

## 2013-12-20 NOTE — Progress Notes (Signed)
Reporting rn reported that pt has had some neuro change, basically twitching and increased bil. arm drift and the PA on call has been paged. The reporting rn also stated that pt did have some pain med. Order for CT scan was placed and carried out. Will continue to monitor pt. ----Ata Pecha, rn

## 2013-12-21 DIAGNOSIS — I059 Rheumatic mitral valve disease, unspecified: Secondary | ICD-10-CM

## 2013-12-21 DIAGNOSIS — R279 Unspecified lack of coordination: Secondary | ICD-10-CM

## 2013-12-21 DIAGNOSIS — I6350 Cerebral infarction due to unspecified occlusion or stenosis of unspecified cerebral artery: Secondary | ICD-10-CM

## 2013-12-21 DIAGNOSIS — I6330 Cerebral infarction due to thrombosis of unspecified cerebral artery: Secondary | ICD-10-CM

## 2013-12-21 LAB — CBC
HCT: 29 % — ABNORMAL LOW (ref 36.0–46.0)
HEMOGLOBIN: 10.1 g/dL — AB (ref 12.0–15.0)
MCH: 31.2 pg (ref 26.0–34.0)
MCHC: 34.8 g/dL (ref 30.0–36.0)
MCV: 89.5 fL (ref 78.0–100.0)
Platelets: 177 10*3/uL (ref 150–400)
RBC: 3.24 MIL/uL — ABNORMAL LOW (ref 3.87–5.11)
RDW: 13.5 % (ref 11.5–15.5)
WBC: 7.8 10*3/uL (ref 4.0–10.5)

## 2013-12-21 LAB — GLUCOSE, CAPILLARY
GLUCOSE-CAPILLARY: 177 mg/dL — AB (ref 70–99)
GLUCOSE-CAPILLARY: 93 mg/dL (ref 70–99)
GLUCOSE-CAPILLARY: 134 mg/dL — AB (ref 70–99)
Glucose-Capillary: 114 mg/dL — ABNORMAL HIGH (ref 70–99)
Glucose-Capillary: 142 mg/dL — ABNORMAL HIGH (ref 70–99)
Glucose-Capillary: 80 mg/dL (ref 70–99)

## 2013-12-21 LAB — LIPID PANEL
Cholesterol: 147 mg/dL (ref 0–200)
HDL: 39 mg/dL — ABNORMAL LOW (ref >39)
LDL Cholesterol: 91 mg/dL (ref 0–99)
TRIGLYCERIDES: 83 mg/dL (ref <150)
Total CHOL/HDL Ratio: 3.8 RATIO
VLDL: 17 mg/dL (ref 0–40)

## 2013-12-21 LAB — HEMOGLOBIN A1C
Hgb A1c MFr Bld: 8.8 % — ABNORMAL HIGH (ref <5.7)
Mean Plasma Glucose: 206 mg/dL — ABNORMAL HIGH (ref <117)

## 2013-12-21 LAB — URINE CULTURE: Colony Count: >=100000

## 2013-12-21 LAB — BASIC METABOLIC PANEL
BUN: 39 mg/dL — ABNORMAL HIGH (ref 6–23)
CALCIUM: 8.5 mg/dL (ref 8.4–10.5)
CO2: 23 mEq/L (ref 19–32)
Chloride: 104 mEq/L (ref 96–112)
Creatinine, Ser: 0.77 mg/dL (ref 0.50–1.10)
GFR calc Af Amer: >90 mL/min (ref >90)
GFR calc non Af Amer: 86 mL/min — ABNORMAL LOW (ref >90)
Glucose, Bld: 102 mg/dL — ABNORMAL HIGH (ref 70–99)
Potassium: 4.7 mEq/L (ref 3.7–5.3)
SODIUM: 138 meq/L (ref 137–147)

## 2013-12-21 LAB — GABAPENTIN LEVEL: GABAPENTIN LVL: 19 ug/mL (ref 2.0–20.0)

## 2013-12-21 MED ORDER — MORPHINE SULFATE ER 15 MG PO TBCR
15.0000 mg | EXTENDED_RELEASE_TABLET | Freq: Two times a day (BID) | ORAL | Status: DC
  Administered 2013-12-21 – 2013-12-22 (×2): 15 mg via ORAL
  Filled 2013-12-21: qty 1

## 2013-12-21 MED ORDER — LIDOCAINE 5 % EX PTCH
1.0000 | MEDICATED_PATCH | CUTANEOUS | Status: DC
  Administered 2013-12-21: 1 via TRANSDERMAL
  Filled 2013-12-21 (×2): qty 1

## 2013-12-21 MED ORDER — GABAPENTIN 400 MG PO CAPS
800.0000 mg | ORAL_CAPSULE | Freq: Three times a day (TID) | ORAL | Status: DC
  Administered 2013-12-21: 800 mg via ORAL
  Filled 2013-12-21 (×5): qty 2

## 2013-12-21 MED ORDER — LORAZEPAM 0.5 MG PO TABS
0.5000 mg | ORAL_TABLET | Freq: Once | ORAL | Status: AC
  Administered 2013-12-21: 0.5 mg via ORAL
  Filled 2013-12-21: qty 1

## 2013-12-21 MED ORDER — GABAPENTIN 300 MG PO CAPS
300.0000 mg | ORAL_CAPSULE | Freq: Three times a day (TID) | ORAL | Status: DC
  Filled 2013-12-21 (×2): qty 1

## 2013-12-21 MED ORDER — GABAPENTIN 800 MG PO TABS
800.0000 mg | ORAL_TABLET | Freq: Three times a day (TID) | ORAL | Status: DC
  Filled 2013-12-21 (×2): qty 1

## 2013-12-21 NOTE — Progress Notes (Signed)
  Echocardiogram 2D Echocardiogram has been performed.  Virginia [name] 12/21/2013, 9:45 AM

## 2013-12-21 NOTE — Evaluation (Signed)
Occupational Therapy Evaluation Patient Details Name: Virginia [name] MRN: [id number] DOB: [birth year] Today's Date: 12/21/2013 Time: 4098-1191 OT Time Calculation (min): 24 min  OT Assessment / Plan / Recommendation History of present illness pt presents with 3-4 day hx of confusion, tremors, and weakness.     Clinical Impression   This 68 yo female admitted with above presents to acute OT with anxiety, decreased balance, decreased vision (? worse than before), thus affecting pt's PLOF of Independent, will benefit from acute OT to work back towards PLOF.    OT Assessment  Patient needs continued OT Services    Follow Up Recommendations  CIR       Equipment Recommendations   (TBD)       Frequency  Min 2X/week    Precautions / Restrictions Precautions Precautions: Fall Restrictions Weight Bearing Restrictions: No   Pertinent Vitals/Pain Temp 98.2; HR 68; BP 168/62; sats 98    ADL  Eating/Feeding: Supervision/safety (due to anxiousness that she does not feel well) Where Assessed - Eating/Feeding: Edge of bed Grooming: Wash/dry hands;Min guard Where Assessed - Grooming: Unsupported standing Upper Body Bathing: Supervision/safety;Set up Where Assessed - Upper Body Bathing: Unsupported sitting Lower Body Bathing: Min guard Where Assessed - Lower Body Bathing: Unsupported sit to stand Upper Body Dressing: Supervision/safety;Set up Where Assessed - Upper Body Dressing: Unsupported sitting Lower Body Dressing: Minimal assistance Where Assessed - Lower Body Dressing: Unsupported sit to stand Toilet Transfer: Min guard Toilet Transfer Method: Sit to Barista: Comfort height toilet;Grab bars Toileting - Architect and Hygiene: Min guard Where Assessed - Engineer, mining and Hygiene: Sit to stand from 3-in-1 or toilet Transfers/Ambulation Related to ADLs: Min guard A without AD    OT Diagnosis: Generalized weakness;Blindness and  low vision  OT Problem List: Impaired balance (sitting and/or standing);Decreased cognition;Other (comment) (anxiety) OT Treatment Interventions: Self-care/ADL training;Patient/family education;Balance training;DME and/or AE instruction;Visual/perceptual remediation/compensation   OT Goals(Current goals can be found in the care plan section) Acute Rehab OT Goals Patient Stated Goal: I want to feel better OT Goal Formulation: With patient Time For Goal Achievement: 01/04/14 Potential to Achieve Goals: Good  Visit Information  Last OT Received On: 12/21/13 Assistance Needed: +1 History of Present Illness: pt presents with 3-4 day hx of confusion, tremors, and weakness.         Prior Functioning     Home Living Family/patient expects to be discharged to:: Private residence Living Arrangements: Spouse/significant other Available Help at Discharge: Available 24 hours/day;Family Type of Home: House Home Access: Stairs to enter [employer] of Steps: 2 Entrance Stairs-Rails: None Home Layout: One level Home Equipment: Environmental consultant - 2 wheels;Shower seat;Bedside commode Prior Function Level of Independence: Independent Comments: Per husband pt was sharp minded and without physical deficits.   Communication Communication:  (blank stares at times) Dominant Hand: Right         Vision/Perception Vision - History Baseline Vision:  ("blind" in right eye--can see movements and outlines, but not details) Patient Visual Report: No change from baseline Vision - Assessment Eye Alignment: Within Functional Limits Vision Assessment: Vision tested Ocular Range of Motion: Within Functional Limits (with each eye tested independent of the other) Alignment/Gaze Preference: Within Defined Limits Tracking/Visual Pursuits: Able to track stimulus in all quads without difficulty (each eye tested individually of the other) Additional Comments: Need to further test vision due to pt drifting  off to sleep and hard to concentrate due to pt does not like the way she  is feeling (sweaty, hot)--all vitals check (WNL) and RN made aware. Need to check visual pursuits with both eyes open, visual fields   Cognition  Cognition Arousal/Alertness: Lethargic Behavior During Therapy: Flat affect;Anxious Overall Cognitive Status: Impaired/Different from baseline Area of Impairment: Attention Current Attention Level: Alternating Following Commands: Follows one step commands consistently    Extremity/Trunk Assessment Upper Extremity Assessment Upper Extremity Assessment: Overall WFL for tasks assessed     Mobility Bed Mobility Overal bed mobility: Needs Assistance Bed Mobility: Supine to Sit;Sit to Supine Supine to sit: Supervision;HOB elevated Sit to supine: Supervision;HOB elevated General bed mobility comments: A to bring Bil LEs into bed.   Transfers Overall transfer level: Needs assistance Equipment used: 1 person hand held assist Transfers: Sit to/from Stand Sit to Stand: Min guard Stand pivot transfers: Min guard General transfer comment: Demos improved mobility and decreased need for A.          Balance Balance Overall balance assessment: Needs assistance Standing balance support: No upper extremity supported Standing balance-Leahy Scale: Poor   End of Session OT - End of Session Activity Tolerance: Patient limited by lethargy (limited by anxiousness) Patient left: in bed;with call bell/phone within reach Nurse Communication:  (husband wants to talk to her about pt's pain meds)       Virginia [name], Virginia [name] [phone] 12/21/2013, 2:43 PM

## 2013-12-21 NOTE — Progress Notes (Signed)
*  PRELIMINARY RESULTS* Vascular Ultrasound Carotid Duplex (Doppler) has been completed.   Findings suggest 1-39% internal carotid artery stenosis bilaterally. Vertebral arteries are patent with antegrade flow.  12/21/2013 2:41 PM Gertie FeyMichelle Capers Hagmann, RVT, RDCS, RDMS

## 2013-12-21 NOTE — Progress Notes (Addendum)
[facility] PROGRESS NOTE  Virginia [name] ZOX:[id number] DOB: 10-11-1946 DOA: 12/19/2013 PCP: Selinda Flavin, MD  Assessment/Plan: Vertigo/gait ataxia/fall and chipped tooth  - DD: Hypotension, metabolic abnormalities, rule out posterior circulation/cerebellar CVA  - tele - MRI and MRA brain without contrast: Small acute infarct in the right corona  radiata. - Neurology consulted - ASA - PT and OT evaluation.   Fever -r/o flu prob UTI- rocephin- ecoli  Dehydration/hypotension  - Secondary to poor oral intake, diuretics and antihypertensives  - Hold culprit medications and aggressively hydrate with IV fluids and monitor.   Acute renal failure  - Secondary to dehydration, diuretics and ACE inhibitors.  - Treatment as above and trend daily BMP.  -resolved  Hypoxia  - Unclear etiology. No acute findings on chest x-ray and no respiratory symptoms.  - As per spouse, patient does have history of snoring and apneic spells at night.  - DD: OSA/OHS. No history of recent travel.  VQ scan no PE - Oxygen supplements and monitor.   IDDM  - Well slightly reduce home dose of Lantus and add SSI. Hold oral hypoglycemics secondary to worsening renal functions and potential for hypoglycemia.   Chronic back pain  - decrease pain meds as kidney function is worsening -lidocaine patch   -outpatient OSA testing     Code Status: full Family Communication: patient Disposition Plan: MRI pending; PT eval   Consultants:  neuro  Procedures:    Antibiotics:    HPI/Subjective: Patient with fevers No myalgias  Objective: Filed Vitals:   12/21/13 1114  BP: 129/45  Pulse: 78  Temp: 98.3 F (36.8 C)  Resp: 20    Intake/Output Summary (Last 24 hours) at 12/21/13 1249 Last data filed at 12/21/13 1200  Gross per 24 hour  Intake      0 ml  Output      4 ml  Net     -4 ml   Filed Weights   12/20/13 2145  Weight: 104.69 kg (230 lb 12.8 oz)    Exam:   General:   A+Ox3, NAD  Cardiovascular: rrr  Respiratory: clear anterior  Abdomen: +BS, soft  Musculoskeletal: moves all 4 ext   Data Reviewed: Basic Metabolic Panel:  Recent Labs Lab 12/19/13 0042 12/19/13 1021 12/19/13 1034 12/21/13 0500  NA 134* 132* 132* 138  K 5.4* 5.3 5.2 4.7  CL 100 95* 99 104  CO2 19 21  --  23  GLUCOSE 134* 139* 139* 102*  BUN 84* 84* 104* 39*  CREATININE 2.24* 2.73* 2.90* 0.77  CALCIUM 7.8* 8.0*  --  8.5  MG 2.3  --   --   --    Liver Function Tests:  Recent Labs Lab 12/19/13 1021  AST 29  ALT 16  ALKPHOS 76  BILITOT 0.4  PROT 6.8  ALBUMIN 3.2*   No results found for this basename: LIPASE, AMYLASE,  in the last 168 hours No results found for this basename: AMMONIA,  in the last 168 hours CBC:  Recent Labs Lab 12/19/13 0042 12/19/13 1021 12/19/13 1034 12/21/13 0500  WBC 9.9 10.1  --  7.8  NEUTROABS  --  5.4  --   --   HGB 10.7* 11.7* 12.2 10.1*  HCT 31.7* 34.9* 36.0 29.0*  MCV 91.1 92.6  --  89.5  PLT 215 254  --  177   Cardiac Enzymes:  Recent Labs Lab 12/19/13 1021  TROPONINI <0.30   BNP (last 3 results)  Recent Labs  12/19/13  1021  PROBNP 19.4   CBG:  Recent Labs Lab 12/20/13 1119 12/20/13 1911 12/20/13 2136 12/21/13 0632 12/21/13 1148  GLUCAP 108* 156* 177* 93 114*    Recent Results (from the past 240 hour(s))  URINE CULTURE     Status: None   Collection Time    12/19/13 12:05 PM      Result Value Range Status   Specimen Description URINE, CLEAN CATCH   Final   Special Requests NONE   Final   Culture  Setup Time     Final   Value: 12/19/2013 13:50     Performed at [facility]   Colony Count     Final   Value: >=100,000 COLONIES/ML     [facility]formed at [facility]   Culture     Final   Value: ESCHERICHIA COLI     Performed at [facility]   Report Status PENDING   Incomplete   Organism ID, Bacteria ESCHERICHIA COLI   Final  MRSA PCR SCREENING     Status: None   Collection Time     12/19/13  4:50 PM      Result Value Range Status   MRSA by PCR NEGATIVE  NEGATIVE Final   Comment:            The GeneXpert MRSA Assay (FDA     approved for NASAL specimens     only), is one component of a     comprehensive MRSA colonization     surveillance program. It is not     intended to diagnose MRSA     infection nor to guide or     monitor treatment for     MRSA infections.     Studies: Ct Head Wo Contrast  12/20/2013   CLINICAL DATA:  Altered mental status; decreased mental acuity.  EXAM: CT HEAD WITHOUT CONTRAST  TECHNIQUE: Contiguous axial images were obtained from the base of the skull through the vertex without intravenous contrast.  COMPARISON:  CT of the head performed 12/19/2013  FINDINGS: There is no evidence of acute infarction, mass lesion, or intra- or extra-axial hemorrhage on CT.  Mild periventricular white matter change likely reflects small vessel ischemic microangiopathy.  The posterior fossa, including the cerebellum, brainstem and fourth ventricle, is within normal limits. The third and lateral ventricles, and basal ganglia are unremarkable in appearance. The cerebral hemispheres are symmetric in appearance, with normal gray-white differentiation. No mass effect or midline shift is seen.  There is no evidence of fracture; visualized osseous structures are unremarkable in appearance. The orbits are within normal limits. The paranasal sinuses and mastoid air cells are well-aerated. No significant soft tissue abnormalities are seen.  IMPRESSION: 1. No acute intracranial pathology seen on CT. 2. Mild small vessel ischemic microangiopathy.   Electronically Signed   By: Roanna RaiderJeffery  Chang M.D.   On: 12/20/2013 00:57   Mr Maxine GlennMra Head Wo Contrast  12/20/2013   CLINICAL DATA:  Altered mental status. Ataxia. Evaluate for stroke.  EXAM: MRI HEAD WITHOUT CONTRAST  MRA HEAD WITHOUT CONTRAST  TECHNIQUE: Multiplanar, multiecho pulse sequences of the brain and surrounding structures were  obtained without intravenous contrast. Angiographic images of the head were obtained using MRA technique without contrast.  COMPARISON:  Head CT 12/20/2013  FINDINGS: MRI HEAD FINDINGS  The patient was unable to complete the examination. Only axial and coronal diffusion weighted images and axial T2 weighted images of the brain were obtained.  There is a 1 cm  acute infarct in the right corona radiata. T2 hyperintensities in the subcortical and periventricular white matter and pons are compatible with mild to moderate chronic small vessel ischemic disease. Mild age-related cerebral atrophy is present. There is no gross evidence of intracranial hemorrhage, mass, midline shift, or extra-axial fluid collection. Major intracranial vascular flow voids are grossly unremarkable. Prior right cataract surgery is noted. Left maxillary sinus mucous retention cyst is present. Mastoid air cells are clear.  MRA HEAD FINDINGS  The visualized distal vertebral arteries are patent and codominant. Left PICA is unremarkable. Right PICA likely arises from the right AICA. Left AICA is also patent. Basilar artery is patent without stenosis. SCAs are patent. PCAs are patent and unremarkable. Posterior communicating arteries are not identified.  The visualized portion of the distal left cervical internal carotid artery appears tortuous. Intracranial internal carotid arteries are patent from skullbase to carotid termini. ACAs and MCAs are unremarkable. Incidental note is made of a median artery of the corpus callosum. No intracranial aneurysm is identified.  IMPRESSION: 1. Incomplete examination. Small acute infarct in the right corona radiata. 2. Mild to moderate chronic small vessel ischemic disease. 3. MRA without evidence of major intracranial arterial occlusion or high-grade stenosis.   Electronically Signed   By: Sebastian Ache   On: 12/20/2013 19:30   Mr Brain Wo Contrast  12/20/2013   CLINICAL DATA:  Altered mental status. Ataxia.  Evaluate for stroke.  EXAM: MRI HEAD WITHOUT CONTRAST  MRA HEAD WITHOUT CONTRAST  TECHNIQUE: Multiplanar, multiecho pulse sequences of the brain and surrounding structures were obtained without intravenous contrast. Angiographic images of the head were obtained using MRA technique without contrast.  COMPARISON:  Head CT 12/20/2013  FINDINGS: MRI HEAD FINDINGS  The patient was unable to complete the examination. Only axial and coronal diffusion weighted images and axial T2 weighted images of the brain were obtained.  There is a 1 cm acute infarct in the right corona radiata. T2 hyperintensities in the subcortical and periventricular white matter and pons are compatible with mild to moderate chronic small vessel ischemic disease. Mild age-related cerebral atrophy is present. There is no gross evidence of intracranial hemorrhage, mass, midline shift, or extra-axial fluid collection. Major intracranial vascular flow voids are grossly unremarkable. Prior right cataract surgery is noted. Left maxillary sinus mucous retention cyst is present. Mastoid air cells are clear.  MRA HEAD FINDINGS  The visualized distal vertebral arteries are patent and codominant. Left PICA is unremarkable. Right PICA likely arises from the right AICA. Left AICA is also patent. Basilar artery is patent without stenosis. SCAs are patent. PCAs are patent and unremarkable. Posterior communicating arteries are not identified.  The visualized portion of the distal left cervical internal carotid artery appears tortuous. Intracranial internal carotid arteries are patent from skullbase to carotid termini. ACAs and MCAs are unremarkable. Incidental note is made of a median artery of the corpus callosum. No intracranial aneurysm is identified.  IMPRESSION: 1. Incomplete examination. Small acute infarct in the right corona radiata. 2. Mild to moderate chronic small vessel ischemic disease. 3. MRA without evidence of major intracranial arterial occlusion or  high-grade stenosis.   Electronically Signed   By: Sebastian Ache   On: 12/20/2013 19:30   US Renal  12/20/2013   CLINICAL DATA:  Acute renal failure.  EXAM: RENAL/URINARY TRACT ULTRASOUND COMPLETE  COMPARISON:  None.  FINDINGS: Right Kidney:  Length: 12.3 cm. Echogenicity within normal limits. No mass or hydronephrosis visualized.  Left Kidney:  Length: 12.1  cm. Echogenicity within normal limits. No mass or hydronephrosis visualized.  Bladder:  Appears normal for degree of bladder distention.  IMPRESSION: Normal exam.   Electronically Signed   By: Geanie Cooley M.D.   On: 12/20/2013 16:09   Nm Pulmonary Perf And Vent  12/19/2013   CLINICAL DATA:  Shortness of breath. History of hypertension, stroke and dizziness appear  EXAM: NUCLEAR MEDICINE VENTILATION - PERFUSION LUNG SCAN  TECHNIQUE: Ventilation images were obtained in multiple projections using inhaled aerosol technetium 99 M DTPA. Perfusion images were obtained in multiple projections after intravenous injection of Tc-14m MAA.  COMPARISON:  Chest radiographs same date.  RADIOPHARMACEUTICALS:  40.0 mCi Tc-75m DTPA aerosol and 6.0 mCi Tc-17m MAA  FINDINGS: Ventilation: There is mild central airway clumping of the aerosol. No focal ventilation defects are demonstrated.  Perfusion: No wedge shaped peripheral perfusion defects to suggest acute pulmonary embolism.  IMPRESSION: Very low probability for acute pulmonary embolism.   Electronically Signed   By: Roxy Horseman M.D.   On: 12/19/2013 16:32    Scheduled Meds: . aspirin  325 mg Oral Daily  . cefTRIAXone (ROCEPHIN)  IV  1 g Intravenous Q24H  . citalopram  20 mg Oral QHS  . enoxaparin (LOVENOX) injection  40 mg Subcutaneous Q24H  . gabapentin  800 mg Oral TID  . insulin aspart  0-5 Units Subcutaneous QHS  . insulin aspart  0-9 Units Subcutaneous TID WC  . insulin glargine  40 Units Subcutaneous BID  . lidocaine  1 patch Transdermal Q24H  . morphine  15 mg Oral Q12H  . sodium chloride  3 mL  Intravenous Q12H   Continuous Infusions:    Active Problems:   Vertigo   Dehydration   Acute renal failure   Anemia   Fall at home   Ataxia   Hypotension   Hypertension   Diabetes mellitus without complication   Chronic pain   Hypoxia    Time spent: 35 min    Channah Godeaux  [facility] Pager [phone]. If 7PM-7AM, please contact night-coverage at [email or web address], password [facility] 12/21/2013, 12:49 PM  LOS: 2 days

## 2013-12-21 NOTE — Progress Notes (Signed)
At 1400, patient was sating 98% Room air so weaned off from the oxygen.

## 2013-12-21 NOTE — Consult Note (Signed)
Physical Medicine and Rehabilitation Consult Reason for Consult: CVA Referring Physician: [facility]   HPI: Virginia [name] is a 68 y.o. right-handed female with history of insulin-dependent diabetes mellitus, hypertension, remote infarct with resultant right eye blindness. Admitted 12/19/2013 with intermittent dizziness as well as intermittent nausea as well as fall breaking her front tooth. MRI of the brain showed small acute infarct right corona radiata. MRA of the head without stenosis or occlusion. Echocardiogram is pending . A venous perfusion scan completed due to some intermittent shortness of breath was negative for pulmonary emboli. Findings of elevated creatinine 2.24 on admission with baseline of 0.68 in May of 2014. Renal ultrasound was negative. Maintained on gentle IV fluids with latest creatinine within normal limits. Neurology services consulted patient did not receive TPA. Placed on aspirin for CVA prophylaxis. Subcutaneous Lovenox for DVT prophylaxis. Urine culture greater than 100,000 Escherichia coli maintained on intravenous Rocephin. Physical therapy evaluation completed 12/20/2013 with recommendations for physical medicine rehabilitation consult to consider inpatient rehabilitation services.   Review of Systems  Eyes:       Right eye vision loss  Gastrointestinal: Positive for nausea.  Musculoskeletal: Positive for back pain.  Neurological: Positive for dizziness.  Psychiatric/Behavioral: The patient has insomnia.   All other systems reviewed and are negative.   Past Medical History  Diagnosis Date  . Hypertension   . Stroke     behind rt eye / visual defficiet rt eye  . Spinal stenosis of lumbar region   . Arthritis   . Difficulty sleeping     due to back pain  . Diabetes mellitus without complication     fasting 120-130s  . History of blood transfusion   . H/O Bell's palsy left   Past Surgical History  Procedure Laterality Date  . Cholecystectomy    . Abdominal  hysterectomy    . Oophorectomy    . Bladder surgery      bladder tack  . Cervical spine surgery      cervical fusion  . Lumbar laminectomy/decompression microdiscectomy N/A 01/31/2013    Procedure: MICRO LUMBAR DECOMPRESSION L4-5;  Surgeon: Javier DockerJeffrey C Beane, MD;  Location: [facility];  Service: Orthopedics;  Laterality: N/A;  Bilateral Decompression L4-L5  . Cholecystectomy open N/A   . Vaginal hysterectomy     Family History  Problem Relation Age of Onset  . Kidney failure Sister    Social History:  reports that she has never smoked. She does not have any smokeless tobacco history on file. She reports that she does not drink alcohol or use illicit drugs. Allergies:  Allergies  Allergen Reactions  . Shellfish Allergy Anaphylaxis   Medications Prior to Admission  Medication Sig Dispense Refill  . acetaminophen (TYLENOL) 500 MG tablet Take 1,000 mg by mouth every 6 (six) hours as needed for moderate pain or headache.      Marland Kitchen. aspirin 325 MG tablet Take 325 mg by mouth daily.      . citalopram (CELEXA) 20 MG tablet Take 20 mg by mouth at bedtime.      . DiphenhydrAMINE HCl (BENADRYL PO) Take 2 capsules by mouth at bedtime as needed (sleep).      . gabapentin (NEURONTIN) 300 MG capsule Take 1,200 mg by mouth 3 (three) times daily.       Marland Kitchen. glipiZIDE (GLUCOTROL) 5 MG tablet Take 5 mg by mouth 2 (two) times daily before a meal.      . hydrochlorothiazide (HYDRODIURIL) 25 MG tablet Take 25 mg by  mouth daily.      . insulin glargine (LANTUS) 100 UNIT/ML injection Inject 50 Units into the skin 2 (two) times daily.      . insulin lispro (HUMALOG) 100 UNIT/ML injection Inject 10-20 Units into the skin 3 (three) times daily before meals. Blood sugar below 150 use 10 units  If above 150 use 20 units      . lisinopril (PRINIVIL,ZESTRIL) 40 MG tablet Take 40 mg by mouth daily.      Marland Kitchen morphine (MS CONTIN) 60 MG 12 hr tablet Take 60 mg by mouth every 12 (twelve) hours.      Marland Kitchen morphine (MSIR) 15 MG tablet  Take 15 mg by mouth every 6 (six) hours as needed (pain).      Marland Kitchen OVER THE COUNTER MEDICATION Place 1 drop into both eyes daily as needed (dry eyes). Eye drops for dry eyes        Home: Home Living Family/patient expects to be discharged to:: Inpatient rehab Living Arrangements: Spouse/significant other  Functional History: Prior Function Comments: Per husband pt was sharp minded and without physical deficits.   Functional Status:  Mobility:     Ambulation/Gait Ambulation Distance (Feet): 15 Feet General Gait Details: pt generally unsteady and requring MinA to maintain balance.      ADL:    Cognition: Cognition Overall Cognitive Status: Impaired/Different from baseline Orientation Level: Oriented to person;Oriented to place;Oriented to situation;Disoriented to time Cognition Arousal/Alertness: Awake/alert Behavior During Therapy: Flat affect Overall Cognitive Status: Impaired/Different from baseline Area of Impairment: Attention;Memory;Problem solving Orientation Level: Disoriented to;Time Current Attention Level: Alternating Memory: Decreased short-term memory Following Commands: Follows one step commands with increased time Safety/Judgement: Decreased awareness of safety;Decreased awareness of deficits Awareness: Emergent;Anticipatory Problem Solving: Difficulty sequencing;Requires verbal cues;Requires tactile cues General Comments: pt cognition much improved today, though continues to have some memory and problem solving deficits.    Blood pressure 129/45, pulse 78, temperature 98.3 F (36.8 C), temperature source Axillary, resp. rate 20, height 5' (1.524 m), weight 104.69 kg (230 lb 12.8 oz), SpO2 98.00%. Physical Exam  Vitals reviewed. HENT:  Head: Normocephalic.  Eyes:  Negative nystagmus  Neck: Normal range of motion. Neck supple. No thyromegaly present.  Cardiovascular: Normal rate and regular rhythm.   Respiratory: Effort normal and breath sounds normal. No  respiratory distress.  GI: Soft. Bowel sounds are normal. She exhibits no distension.  Neurological:   She was able to state her name and date of birth. She did follow simple commands. Mild left facial droop. Speech slightly slurred. Good sitting balance. Strength grossly symmetrical although she displatyed a left PD and mild decrease in FTN LUE. No sensory deficits. CN exam grossly intact other than left 7  Skin: Skin is warm and dry.  Psychiatric: She has a normal mood and affect. Her behavior is normal. Thought content normal.    Results for orders placed during the hospital encounter of 12/19/13 (from the past 24 hour(s))  INFLUENZA PANEL BY PCR (TYPE A & B, H1N1)     Status: None   Collection Time    12/20/13  1:26 PM      Result Value Range   Influenza A By PCR NEGATIVE  NEGATIVE   Influenza B By PCR NEGATIVE  NEGATIVE   H1N1 flu by pcr NOT DETECTED  NOT DETECTED  GLUCOSE, CAPILLARY     Status: Abnormal   Collection Time    12/20/13  7:11 PM      Result Value Range  Glucose-Capillary 156 (*) 70 - 99 mg/dL   Comment 1 Documented in Chart     Comment 2 Notify RN    GLUCOSE, CAPILLARY     Status: Abnormal   Collection Time    12/20/13  9:36 PM      Result Value Range   Glucose-Capillary 177 (*) 70 - 99 mg/dL  CBC     Status: Abnormal   Collection Time    12/21/13  5:00 AM      Result Value Range   WBC 7.8  4.0 - 10.5 K/uL   RBC 3.24 (*) 3.87 - 5.11 MIL/uL   Hemoglobin 10.1 (*) 12.0 - 15.0 g/dL   HCT 69.6 (*) 29.5 - 28.4 %   MCV 89.5  78.0 - 100.0 fL   MCH 31.2  26.0 - 34.0 pg   MCHC 34.8  30.0 - 36.0 g/dL   RDW 13.2  44.0 - 10.2 %   Platelets 177  150 - 400 K/uL  BASIC METABOLIC PANEL     Status: Abnormal   Collection Time    12/21/13  5:00 AM      Result Value Range   Sodium 138  137 - 147 mEq/L   Potassium 4.7  3.7 - 5.3 mEq/L   Chloride 104  96 - 112 mEq/L   CO2 23  19 - 32 mEq/L   Glucose, Bld 102 (*) 70 - 99 mg/dL   BUN 39 (*) 6 - 23 mg/dL   Creatinine, Ser  7.25  0.50 - 1.10 mg/dL   Calcium 8.5  8.4 - 36.6 mg/dL   GFR calc non Af Amer 86 (*) >90 mL/min   GFR calc Af Amer >90  >90 mL/min  LIPID PANEL     Status: Abnormal   Collection Time    12/21/13  5:00 AM      Result Value Range   Cholesterol 147  0 - 200 mg/dL   Triglycerides 83  <440 mg/dL   HDL 39 (*) >34 mg/dL   Total CHOL/HDL Ratio 3.8     VLDL 17  0 - 40 mg/dL   LDL Cholesterol 91  0 - 99 mg/dL  GLUCOSE, CAPILLARY     Status: None   Collection Time    12/21/13  6:32 AM      Result Value Range   Glucose-Capillary 93  70 - 99 mg/dL   Comment 1 Documented in Chart     Comment 2 Notify RN    GLUCOSE, CAPILLARY     Status: Abnormal   Collection Time    12/21/13 11:48 AM      Result Value Range   Glucose-Capillary 114 (*) 70 - 99 mg/dL   Ct Head Wo Contrast  12/20/2013   CLINICAL DATA:  Altered mental status; decreased mental acuity.  EXAM: CT HEAD WITHOUT CONTRAST  TECHNIQUE: Contiguous axial images were obtained from the base of the skull through the vertex without intravenous contrast.  COMPARISON:  CT of the head performed 12/19/2013  FINDINGS: There is no evidence of acute infarction, mass lesion, or intra- or extra-axial hemorrhage on CT.  Mild periventricular white matter change likely reflects small vessel ischemic microangiopathy.  The posterior fossa, including the cerebellum, brainstem and fourth ventricle, is within normal limits. The third and lateral ventricles, and basal ganglia are unremarkable in appearance. The cerebral hemispheres are symmetric in appearance, with normal gray-white differentiation. No mass effect or midline shift is seen.  There is no evidence of fracture; visualized osseous  structures are unremarkable in appearance. The orbits are within normal limits. The paranasal sinuses and mastoid air cells are well-aerated. No significant soft tissue abnormalities are seen.  IMPRESSION: 1. No acute intracranial pathology seen on CT. 2. Mild small vessel ischemic  microangiopathy.   Electronically Signed   By: Roanna Raider M.D.   On: 12/20/2013 00:57   Mr Maxine Glenn Head Wo Contrast  12/20/2013   CLINICAL DATA:  Altered mental status. Ataxia. Evaluate for stroke.  EXAM: MRI HEAD WITHOUT CONTRAST  MRA HEAD WITHOUT CONTRAST  TECHNIQUE: Multiplanar, multiecho pulse sequences of the brain and surrounding structures were obtained without intravenous contrast. Angiographic images of the head were obtained using MRA technique without contrast.  COMPARISON:  Head CT 12/20/2013  FINDINGS: MRI HEAD FINDINGS  The patient was unable to complete the examination. Only axial and coronal diffusion weighted images and axial T2 weighted images of the brain were obtained.  There is a 1 cm acute infarct in the right corona radiata. T2 hyperintensities in the subcortical and periventricular white matter and pons are compatible with mild to moderate chronic small vessel ischemic disease. Mild age-related cerebral atrophy is present. There is no gross evidence of intracranial hemorrhage, mass, midline shift, or extra-axial fluid collection. Major intracranial vascular flow voids are grossly unremarkable. Prior right cataract surgery is noted. Left maxillary sinus mucous retention cyst is present. Mastoid air cells are clear.  MRA HEAD FINDINGS  The visualized distal vertebral arteries are patent and codominant. Left PICA is unremarkable. Right PICA likely arises from the right AICA. Left AICA is also patent. Basilar artery is patent without stenosis. SCAs are patent. PCAs are patent and unremarkable. Posterior communicating arteries are not identified.  The visualized portion of the distal left cervical internal carotid artery appears tortuous. Intracranial internal carotid arteries are patent from skullbase to carotid termini. ACAs and MCAs are unremarkable. Incidental note is made of a median artery of the corpus callosum. No intracranial aneurysm is identified.  IMPRESSION: 1. Incomplete  examination. Small acute infarct in the right corona radiata. 2. Mild to moderate chronic small vessel ischemic disease. 3. MRA without evidence of major intracranial arterial occlusion or high-grade stenosis.   Electronically Signed   By: Sebastian Ache   On: 12/20/2013 19:30   Mr Brain Wo Contrast  12/20/2013   CLINICAL DATA:  Altered mental status. Ataxia. Evaluate for stroke.  EXAM: MRI HEAD WITHOUT CONTRAST  MRA HEAD WITHOUT CONTRAST  TECHNIQUE: Multiplanar, multiecho pulse sequences of the brain and surrounding structures were obtained without intravenous contrast. Angiographic images of the head were obtained using MRA technique without contrast.  COMPARISON:  Head CT 12/20/2013  FINDINGS: MRI HEAD FINDINGS  The patient was unable to complete the examination. Only axial and coronal diffusion weighted images and axial T2 weighted images of the brain were obtained.  There is a 1 cm acute infarct in the right corona radiata. T2 hyperintensities in the subcortical and periventricular white matter and pons are compatible with mild to moderate chronic small vessel ischemic disease. Mild age-related cerebral atrophy is present. There is no gross evidence of intracranial hemorrhage, mass, midline shift, or extra-axial fluid collection. Major intracranial vascular flow voids are grossly unremarkable. Prior right cataract surgery is noted. Left maxillary sinus mucous retention cyst is present. Mastoid air cells are clear.  MRA HEAD FINDINGS  The visualized distal vertebral arteries are patent and codominant. Left PICA is unremarkable. Right PICA likely arises from the right AICA. Left AICA is also patent.  Basilar artery is patent without stenosis. SCAs are patent. PCAs are patent and unremarkable. Posterior communicating arteries are not identified.  The visualized portion of the distal left cervical internal carotid artery appears tortuous. Intracranial internal carotid arteries are patent from skullbase to carotid  termini. ACAs and MCAs are unremarkable. Incidental note is made of a median artery of the corpus callosum. No intracranial aneurysm is identified.  IMPRESSION: 1. Incomplete examination. Small acute infarct in the right corona radiata. 2. Mild to moderate chronic small vessel ischemic disease. 3. MRA without evidence of major intracranial arterial occlusion or high-grade stenosis.   Electronically Signed   By: Sebastian Ache   On: 12/20/2013 19:30   US Renal  12/20/2013   CLINICAL DATA:  Acute renal failure.  EXAM: RENAL/URINARY TRACT ULTRASOUND COMPLETE  COMPARISON:  None.  FINDINGS: Right Kidney:  Length: 12.3 cm. Echogenicity within normal limits. No mass or hydronephrosis visualized.  Left Kidney:  Length: 12.1 cm. Echogenicity within normal limits. No mass or hydronephrosis visualized.  Bladder:  Appears normal for degree of bladder distention.  IMPRESSION: Normal exam.   Electronically Signed   By: Geanie Cooley M.D.   On: 12/20/2013 16:09   Nm Pulmonary Perf And Vent  12/19/2013   CLINICAL DATA:  Shortness of breath. History of hypertension, stroke and dizziness appear  EXAM: NUCLEAR MEDICINE VENTILATION - PERFUSION LUNG SCAN  TECHNIQUE: Ventilation images were obtained in multiple projections using inhaled aerosol technetium 99 M DTPA. Perfusion images were obtained in multiple projections after intravenous injection of Tc-83m MAA.  COMPARISON:  Chest radiographs same date.  RADIOPHARMACEUTICALS:  40.0 mCi Tc-47m DTPA aerosol and 6.0 mCi Tc-20m MAA  FINDINGS: Ventilation: There is mild central airway clumping of the aerosol. No focal ventilation defects are demonstrated.  Perfusion: No wedge shaped peripheral perfusion defects to suggest acute pulmonary embolism.  IMPRESSION: Very low probability for acute pulmonary embolism.   Electronically Signed   By: Roxy Horseman M.D.   On: 12/19/2013 16:32    Assessment/Plan: Diagnosis: right corona radiata infarct 1. Does the need for close, 24 hr/day medical  supervision in concert with the patient's rehab needs make it unreasonable for this patient to be served in a less intensive setting? No 2. Co-Morbidities requiring supervision/potential complications: ARF, anemia, dm 3. Due to bladder management, bowel management, safety, skin/wound care, disease management, medication administration, pain management and patient education, does the patient require 24 hr/day rehab nursing? NO 4. Does the patient require coordinated care of a physician, rehab nurse, PT (1-2 hrs/day, 5 days/week), OT (1-2 hrs/day, 5 days/week) and SLP (1-2 hrs/day, 5 days/week) to address physical and functional deficits in the context of the above medical diagnosis(es)? No Addressing deficits in the following areas: balance, endurance, locomotion, bowel/bladder control, dressing, toileting, speech and swallowing 5. Can the patient actively participate in an intensive therapy program of at least 3 hrs of therapy per day at least 5 days per week? Potentially 6. The potential for patient to make measurable gains while on inpatient rehab is fair 7. Anticipated functional outcomes upon discharge from inpatient rehab are n/a with PT, n/a with OT, n/a with SLP. 8. Estimated rehab length of stay to reach the above functional goals is: n/a 9. Does the patient have adequate social supports to accommodate these discharge functional goals? Yes 10. Anticipated D/C setting: Home 11. Anticipated post D/C treatments: HH therapy 12. Overall Rehab/Functional Prognosis: good  RECOMMENDATIONS: This patient's condition is appropriate for continued rehabilitative care in the following  setting: HH Patient has agreed to participate in recommended program. Yes Note that insurance prior authorization may be required for reimbursement for recommended care.  Comment: Needs SLP swallowing eval, although a lot of issue may be anxiety. Otherwise doing well and should be able to go home. Husband supportive and  has already been taking her to bathroom.   Ranelle Oyster, MD, [facility] Physical Medicine & Rehabilitation     12/21/2013

## 2013-12-21 NOTE — Progress Notes (Addendum)
Subjective: Twitching and slowed thinking much improved today  She is not certain taht she was getting any benefit from gabapentin.   Exam: Filed Vitals:   12/21/13 0525  BP: 144/60  Pulse: 74  Temp: 98.9 F (37.2 C)  Resp: 20   Gen: In bed, NAD MS: Awake, alert, interactive and appropriate ON:GEXBMCN:PERRL, EOMI Motor: MAEW, possibly mild left leg weakness, though this would be very mild and it is not clear to me that it is truly weak Sensation: intact to temp  MRI brain reviewed- right corona radiata infarct.   Impression: 68 yo F with dizziness in the setting of multiple medical problems. I think that most of her symptoms represent gabapentin toxicity. She has a very high dose and her renal function was impaired. She is on it for neuropathy. She is not sure she was benefiting from it, so will not restart it. Stroke likely due to small vessel disease.   Recommendations: 1) will d/c gabapentin completely, if her neuropathy pain gets a lot worse in this setting, could consider restarting at 800mg  TID(decreased from 1200mg  TID).  2) LDL 91, ok 3) A1C pending 4) echo cartodis pending.  5) Will continue to follow.   Ritta SlotMcNeill Karee Christopherson, MD [facility] [phone]  If 7pm- 7am, please page neurology on call at [phone].

## 2013-12-21 NOTE — Progress Notes (Signed)
Physical Therapy Treatment Patient Details Name: Virginia [name] C Langford MRN: [id number] DOB: May 20, 1946 Today's Date: 12/21/2013 Time: [id number] PT Time Calculation (min): 25 min  PT Assessment / Plan / Recommendation  History of Present Illness pt presents with 3-4 day hx of confusion, tremors, and weakness.     PT Comments   Pt with improved mobility today, though continues to have balance deficits.  Feel pt would still benefit from CIR at D/C to maximize independence prior to returning home.    Follow Up Recommendations  CIR     Does the patient have the potential to tolerate intense rehabilitation     Barriers to Discharge        Equipment Recommendations  Rolling walker with 5" wheels    Recommendations for Other Services    Frequency Min 4X/week   Progress towards PT Goals Progress towards PT goals: Progressing toward goals  Plan Current plan remains appropriate    Precautions / Restrictions Precautions Precautions: Fall Restrictions Weight Bearing Restrictions: No   Pertinent Vitals/Pain Denied pain.      Mobility  Bed Mobility Overal bed mobility: Needs Assistance Bed Mobility: Supine to Sit;Sit to Supine Supine to sit: Supervision;HOB elevated Sit to supine: Mod assist General bed mobility comments: A to bring Bil LEs into bed.   Transfers Overall transfer level: Needs assistance Equipment used: 1 person hand held assist Transfers: Sit to/from Stand Sit to Stand: Min guard General transfer comment: Demos improved mobility and decreased need for A.   Ambulation/Gait Ambulation/Gait assistance: Min assist Ambulation Distance (Feet): 15 Feet Assistive device: 1 person hand held assist Gait Pattern/deviations: Step-through pattern;Decreased stride length;Shuffle General Gait Details: pt generally unsteady and requring MinA to maintain balance.   Modified Rankin (Stroke Patients Only) Pre-Morbid Rankin Score: No significant disability Modified Rankin: Moderately  severe disability    Exercises     PT Diagnosis:    PT Problem List:   PT Treatment Interventions:     PT Goals (current goals can now be found in the care plan section) Acute Rehab PT Goals Patient Stated Goal: None stated Time For Goal Achievement: 01/03/14 Potential to Achieve Goals: Good  Visit Information  Last PT Received On: 12/21/13 Assistance Needed: +1 History of Present Illness: pt presents with 3-4 day hx of confusion, tremors, and weakness.      Subjective Data  Patient Stated Goal: None stated   Cognition  Cognition Arousal/Alertness: Awake/alert Behavior During Therapy: Flat affect Overall Cognitive Status: Impaired/Different from baseline Area of Impairment: Attention;Memory;Problem solving Orientation Level: Disoriented to;Time Current Attention Level: Alternating Memory: Decreased short-term memory Problem Solving: Difficulty sequencing;Requires verbal cues;Requires tactile cues General Comments: pt cognition much improved today, though continues to have some memory and problem solving deficits.      Balance  Balance Overall balance assessment: Needs assistance Standing balance support: No upper extremity supported Standing balance-Leahy Scale: Poor  End of Session PT - End of Session Equipment Utilized During Treatment: Gait belt Activity Tolerance: Patient limited by fatigue Patient left: in bed;with call bell/phone within reach;with bed alarm set;with family/visitor present Nurse Communication: Mobility status   GP     Sunny SchleinRitenour, Dyson Sevey F, [place]PT [phone] 12/21/2013, 12:27 PM

## 2013-12-21 NOTE — Progress Notes (Signed)
   CARE MANAGEMENT NOTE 12/21/2013  Patient:  Virginia [name],Virginia [name]   Account Number:  [id number]  Date Initiated:  12/21/2013  Documentation initiated by:  Jiles CrockerHANDLER,Lilyana Lippman  Subjective/Objective Assessment:   ADMITTED FOR ACUTE RENAL FAILURE, Vertigo/gait ataxia/fall     Action/Plan:   CM FOLLOWING FOR DCP   Anticipated DC Date:  12/26/2013   Anticipated DC Plan: AWAITING ON PT/OT EVALS FOR DISPOSITION      DC Planning Services  CM consult     Status of service:  In process, will continue to follow Medicare Important Message given?  NA - LOS 3 / Initial given by admissions (If response is "NO", the following Medicare IM given date fields will be blank)  Per UR Regulation:  Reviewed for med. necessity/level of care/duration of stay  Comments:  1/23/2015Abelino Derrick- B Khamari Sheehan RN,BSN,MHA [phone]

## 2013-12-22 ENCOUNTER — Encounter (HOSPITAL_COMMUNITY): Payer: Self-pay | Admitting: Emergency Medicine

## 2013-12-22 ENCOUNTER — Emergency Department (HOSPITAL_COMMUNITY)
Admission: EM | Admit: 2013-12-22 | Discharge: 2013-12-22 | Disposition: A | Discharge status: Home / Self Care | Payer: Medicare HMO | Attending: Emergency Medicine | Admitting: Emergency Medicine

## 2013-12-22 DIAGNOSIS — Z79899 Other long term (current) drug therapy: Secondary | ICD-10-CM | POA: Insufficient documentation

## 2013-12-22 DIAGNOSIS — R197 Diarrhea, unspecified: Secondary | ICD-10-CM | POA: Insufficient documentation

## 2013-12-22 DIAGNOSIS — Z90710 Acquired absence of both cervix and uterus: Secondary | ICD-10-CM | POA: Insufficient documentation

## 2013-12-22 DIAGNOSIS — Z7982 Long term (current) use of aspirin: Secondary | ICD-10-CM | POA: Insufficient documentation

## 2013-12-22 DIAGNOSIS — Z794 Long term (current) use of insulin: Secondary | ICD-10-CM | POA: Insufficient documentation

## 2013-12-22 DIAGNOSIS — E119 Type 2 diabetes mellitus without complications: Secondary | ICD-10-CM | POA: Insufficient documentation

## 2013-12-22 DIAGNOSIS — IMO0001 Reserved for inherently not codable concepts without codable children: Secondary | ICD-10-CM | POA: Insufficient documentation

## 2013-12-22 DIAGNOSIS — I10 Essential (primary) hypertension: Secondary | ICD-10-CM | POA: Insufficient documentation

## 2013-12-22 DIAGNOSIS — Z8669 Personal history of other diseases of the nervous system and sense organs: Secondary | ICD-10-CM | POA: Insufficient documentation

## 2013-12-22 DIAGNOSIS — Z8673 Personal history of transient ischemic attack (TIA), and cerebral infarction without residual deficits: Secondary | ICD-10-CM | POA: Insufficient documentation

## 2013-12-22 DIAGNOSIS — M79609 Pain in unspecified limb: Secondary | ICD-10-CM | POA: Insufficient documentation

## 2013-12-22 DIAGNOSIS — Z792 Long term (current) use of antibiotics: Secondary | ICD-10-CM | POA: Insufficient documentation

## 2013-12-22 DIAGNOSIS — M129 Arthropathy, unspecified: Secondary | ICD-10-CM | POA: Insufficient documentation

## 2013-12-22 DIAGNOSIS — Z9089 Acquired absence of other organs: Secondary | ICD-10-CM | POA: Insufficient documentation

## 2013-12-22 DIAGNOSIS — R112 Nausea with vomiting, unspecified: Secondary | ICD-10-CM | POA: Insufficient documentation

## 2013-12-22 DIAGNOSIS — T426X1A Poisoning by other antiepileptic and sedative-hypnotic drugs, accidental (unintentional), initial encounter: Secondary | ICD-10-CM

## 2013-12-22 DIAGNOSIS — R42 Dizziness and giddiness: Secondary | ICD-10-CM | POA: Insufficient documentation

## 2013-12-22 LAB — CBC
HCT: 29.9 % — ABNORMAL LOW (ref 36.0–46.0)
HEMATOCRIT: 32 % — AB (ref 36.0–46.0)
Hemoglobin: 10.3 g/dL — ABNORMAL LOW (ref 12.0–15.0)
Hemoglobin: 11.2 g/dL — ABNORMAL LOW (ref 12.0–15.0)
MCH: 30.3 pg (ref 26.0–34.0)
MCH: 30.4 pg (ref 26.0–34.0)
MCHC: 34.4 g/dL (ref 30.0–36.0)
MCHC: 35 g/dL (ref 30.0–36.0)
MCV: 87.9 fL (ref 78.0–100.0)
MCV: 87 fL (ref 78.0–100.0)
PLATELETS: 203 10*3/uL (ref 150–400)
Platelets: 237 10*3/uL (ref 150–400)
RBC: 3.4 MIL/uL — ABNORMAL LOW (ref 3.87–5.11)
RBC: 3.68 MIL/uL — AB (ref 3.87–5.11)
RDW: 13.1 % (ref 11.5–15.5)
RDW: 12.9 % (ref 11.5–15.5)
WBC: 9.6 10*3/uL (ref 4.0–10.5)
WBC: 10.9 10*3/uL — AB (ref 4.0–10.5)

## 2013-12-22 LAB — BASIC METABOLIC PANEL
BUN: 20 mg/dL (ref 6–23)
BUN: 15 mg/dL (ref 6–23)
CALCIUM: 8.6 mg/dL (ref 8.4–10.5)
CHLORIDE: 101 meq/L (ref 96–112)
CO2: 21 mEq/L (ref 19–32)
CO2: 25 meq/L (ref 19–32)
CREATININE: 0.61 mg/dL (ref 0.50–1.10)
CREATININE: 0.75 mg/dL (ref 0.50–1.10)
Calcium: 8.9 mg/dL (ref 8.4–10.5)
Chloride: 104 mEq/L (ref 96–112)
GFR calc non Af Amer: >90 mL/min (ref >90)
GFR calc non Af Amer: 86 mL/min — ABNORMAL LOW (ref >90)
Glucose, Bld: 70 mg/dL (ref 70–99)
Glucose, Bld: 81 mg/dL (ref 70–99)
POTASSIUM: 4 meq/L (ref 3.7–5.3)
Potassium: 3.9 mEq/L (ref 3.7–5.3)
Sodium: 140 mEq/L (ref 137–147)
Sodium: 138 mEq/L (ref 137–147)

## 2013-12-22 LAB — CG4 I-STAT (LACTIC ACID): Lactic Acid, Venous: 1.02 mmol/L (ref 0.5–2.2)

## 2013-12-22 LAB — URINALYSIS, ROUTINE W REFLEX MICROSCOPIC
BILIRUBIN URINE: NEGATIVE
Glucose, UA: NEGATIVE mg/dL
Hgb urine dipstick: NEGATIVE
Ketones, ur: NEGATIVE mg/dL
Leukocytes, UA: NEGATIVE
Nitrite: NEGATIVE
PH: 6.5 (ref 5.0–8.0)
Protein, ur: NEGATIVE mg/dL
SPECIFIC GRAVITY, URINE: 1.015 (ref 1.005–1.030)
UROBILINOGEN UA: 0.2 mg/dL (ref 0.0–1.0)

## 2013-12-22 LAB — GLUCOSE, CAPILLARY
GLUCOSE-CAPILLARY: 111 mg/dL — AB (ref 70–99)
Glucose-Capillary: 57 mg/dL — ABNORMAL LOW (ref 70–99)

## 2013-12-22 MED ORDER — LIDOCAINE 5 % EX PTCH: 1.0000 | MEDICATED_PATCH | CUTANEOUS | Status: DC

## 2013-12-22 MED ORDER — ONDANSETRON 4 MG PO TBDP: ORAL_TABLET | ORAL | Status: DC

## 2013-12-22 MED ORDER — GABAPENTIN 400 MG PO CAPS
800.0000 mg | ORAL_CAPSULE | Freq: Every day | ORAL | Status: DC
  Filled 2013-12-22: qty 2

## 2013-12-22 MED ORDER — CIPROFLOXACIN HCL 500 MG PO TABS: 500.0000 mg | ORAL_TABLET | Freq: Two times a day (BID) | ORAL | Status: DC

## 2013-12-22 MED ORDER — LOPERAMIDE HCL 2 MG PO CAPS
2.0000 mg | ORAL_CAPSULE | Freq: Once | ORAL | Status: AC
  Administered 2013-12-22: 2 mg via ORAL
  Filled 2013-12-22 (×3): qty 1

## 2013-12-22 MED ORDER — SODIUM CHLORIDE 0.9 % IV BOLUS (SEPSIS)
1000.0000 mL | Freq: Once | INTRAVENOUS | Status: AC
  Administered 2013-12-22: 1000 mL via INTRAVENOUS

## 2013-12-22 MED ORDER — GABAPENTIN 400 MG PO CAPS: 800.0000 mg | ORAL_CAPSULE | Freq: Every day | ORAL | Status: DC

## 2013-12-22 MED ORDER — CIPROFLOXACIN HCL 500 MG PO TABS
500.0000 mg | ORAL_TABLET | Freq: Two times a day (BID) | ORAL | Status: DC
  Administered 2013-12-22: 500 mg via ORAL
  Filled 2013-12-22 (×3): qty 1

## 2013-12-22 MED ORDER — ONDANSETRON HCL 4 MG/2ML IJ SOLN
4.0000 mg | Freq: Once | INTRAMUSCULAR | Status: AC
  Administered 2013-12-22: 4 mg via INTRAVENOUS
  Filled 2013-12-22: qty 2

## 2013-12-22 MED ORDER — MORPHINE SULFATE ER 15 MG PO TBCR: 15.0000 mg | EXTENDED_RELEASE_TABLET | Freq: Two times a day (BID) | ORAL | Status: DC

## 2013-12-22 NOTE — Progress Notes (Signed)
   CARE MANAGEMENT NOTE 12/22/2013  Patient:  Virginia [name],[name]   Account Number:  [id number]  Date Initiated:  12/21/2013  Documentation initiated by:  Jiles CrockerHANDLER,BRENDA  Subjective/Objective Assessment:   ADMITTED FOR ACUTE RENAL FAILURE, Vertigo/gait ataxia/fall     Action/Plan:   CM FOLLOWING FOR DCP   Anticipated DC Date:  12/26/2013   Anticipated DC Plan:  HOME W HOME HEALTH SERVICES      DC Planning Services  CM consult      Choice offered to / List presented to:             Status of service:  Completed, signed off Medicare Important Message given?  NA - LOS <3 / Initial given by admissions (If response is "NO", the following Medicare IM given date fields will be blank) Date Medicare IM given:   Date Additional Medicare IM given:    Discharge Disposition:  HOME/SELF CARE  Per UR Regulation:  Reviewed for med. necessity/level of care/duration of stay  If discussed at Long Length of Stay Meetings, dates discussed:    Comments:  12/22/13 10:20 CM spoke with pt and pt's husband who both politely refuse any home health.  CM let them know if they change their mind to just let their nurse know and I will set HH up for them.  No other CM needs were communicated. Virginia JakschSarah Bailee [name], BSN,  CM [phone].  1/23/2015Abelino [name]- B CHANDLER RN,BSN,MHA [phone]

## 2013-12-22 NOTE — ED Notes (Signed)
Pt also has hx of anxiety; pt is worried about having a stoke; had to calm pt and talk to her to calm down; very restless and fidgets frequently.

## 2013-12-22 NOTE — Evaluation (Signed)
Clinical/Bedside Swallow Evaluation Patient Details  Name: Virginia [name] MRN: [id number] Date of Birth: [birth year]  Today's Date: 12/22/2013 Time: [id number] SLP Time Calculation (min): 16 min  Past Medical History:  Past Medical History  Diagnosis Date  . Hypertension   . Stroke     behind rt eye / visual defficiet rt eye  . Spinal stenosis of lumbar region   . Arthritis   . Difficulty sleeping     due to back pain  . Diabetes mellitus without complication     fasting 120-130s  . History of blood transfusion   . H/O Bell's palsy left   Past Surgical History:  Past Surgical History  Procedure Laterality Date  . Cholecystectomy    . Abdominal hysterectomy    . Oophorectomy    . Bladder surgery      bladder tack  . Cervical spine surgery      cervical fusion  . Lumbar laminectomy/decompression microdiscectomy N/A 01/31/2013    Procedure: MICRO LUMBAR DECOMPRESSION L4-5;  Surgeon: Javier DockerJeffrey C Beane, MD;  Location: [facility];  Service: Orthopedics;  Laterality: N/A;  Bilateral Decompression L4-L5  . Cholecystectomy open N/A   . Vaginal hysterectomy     HPI:  68 y.o. female with history of IDDM, hypertension, remote stroke with right eye blindness, left Bell's palsy with facial asymmetry, chronic back pain on opioids, presented to the ED with complaints of dizziness and a fall this morning. Patient states that she has had intermittent dizziness which she describes as "world spinning around" for approximately 2 months but did not bother her significantly. Her symptoms seem to have worsened in the last 4 days. Her appetite has decreased but she denies abdominal pain or diarrhea. Her symptoms seem to have even worse and this morning when she tried to get up and use the bathroom, felt dizzy and fell face forward and chipped a tooth. She denies loss of consciousness. Her husband noticed slurred speech but no asymmetric weakness.  CT head negative for acute findings, MRI Small acute infarct in  the right corona radiata, Mild to moderate chronic small vessel ischemic disease. Chest x-ray negative for acute findings.     Assessment / Plan / Recommendation Clinical Impression  Pt. reports a globus sensation for greater than 6 months in pharynx and excess mucous.  Oropharyngeal swallow function is within functional limits.  Pt. stated she wondered if problems were with her esophagus although she denied acid/heartburn sensation or reflux.  Suspect a possible esophageal component, therefore SLP therapeutically educated pt./husband on esophageal precautions and consult GI if symptoms worsen.  She verbalized understanding of precautions and this SLP recommends she continue regular texture diet and thin liquids, pills with water, straws okay.  No follow up needed.    Aspiration Risk  Mild    Diet Recommendation Regular;Thin liquid   Liquid Administration via: Cup;Straw Medication Administration: Whole meds with liquid Supervision: Patient able to self feed Compensations: Slow rate;Small sips/bites Postural Changes and/or Swallow Maneuvers: Seated upright 90 degrees;Upright 30-60 min after meal    Other  Recommendations Oral Care Recommendations: Oral care BID   Follow Up Recommendations  None    Frequency and Duration        Pertinent Vitals/Pain No pain          Swallow Study         Oral/Motor/Sensory Function Overall Oral Motor/Sensory Function: Impaired at baseline (Bell's Palsy on left) Labial ROM: Reduced left Labial Symmetry: Abnormal symmetry left Labial Strength: Within  Functional Limits Lingual ROM: Within Functional Limits Lingual Symmetry: Within Functional Limits Lingual Strength: Within Functional Limits Facial ROM: Within Functional Limits Facial Symmetry: Within Functional Limits Facial Strength: Within Functional Limits Velum: Within Functional Limits Mandible: Within Functional Limits   Ice Chips Ice chips: Not tested   Thin Liquid Thin Liquid:  Within functional limits Presentation: Cup;Straw    Nectar Thick Nectar Thick Liquid: Not tested   Honey Thick Honey Thick Liquid: Not tested   Puree Puree: Not tested   Solid   GO    Solid: Within functional limits       Breck Coons [employer] [phone]  12/22/2013

## 2013-12-22 NOTE — Progress Notes (Signed)
Patient d/c this am, assessments remain unchanged as at now. Patient's husband refused home health services. MD aware.D/c instructions given and prescription given as well to husband.

## 2013-12-22 NOTE — ED Notes (Signed)
The pt was just discharged this am after being admitted for a stroke and staying  For 3 days.  She has been vomiting since she went home today and is unable to keep anything down. C/o pain in her legs and her back

## 2013-12-22 NOTE — Progress Notes (Signed)
Hypoglycemic Event  CBG: 57  Treatment: 15 GM carbohydrate snack  Symptoms: None  Follow-up CBG: Time:0700 CBG Result:111  Possible Reasons for Event: Unknown  Comments/MD notified:    Alvira PhilipsMillner, Ambreen Tufte Shanice  Remember to initiate Hypoglycemia Order Set & complete

## 2013-12-22 NOTE — ED Provider Notes (Signed)
CSN: [id number]     Arrival date & time 12/22/13  1816 History   First MD Initiated Contact with Patient 12/22/13 1828     Chief Complaint  Patient presents with  . Emesis   (Consider location/radiation/quality/duration/timing/severity/associated sxs/prior Treatment) HPI Comments: Recent admission for stroke - just discharged this morning. Nauseated here, diarrhea began last night. Once at home, had one bite of a fish sandwich and began vomiting immediately. Persistent vomiting since then. ~12 episodes today.   Patient is a 68 y.o. female presenting with vomiting. The history is provided by the patient.  Emesis Severity:  Severe Timing:  Constant Number of daily episodes:  12 Quality:  Stomach contents Feeding tolerance: nothing. Progression:  Worsening Chronicity:  New Relieved by:  Nothing Worsened by:  Nothing tried Associated symptoms: diarrhea and myalgias (legs ache)   Associated symptoms: no abdominal pain, no chills, no fever and no sore throat     Past Medical History  Diagnosis Date  . Hypertension   . Stroke     behind rt eye / visual defficiet rt eye  . Spinal stenosis of lumbar region   . Arthritis   . Difficulty sleeping     due to back pain  . Diabetes mellitus without complication     fasting 120-130s  . History of blood transfusion   . H/O Bell's palsy left   Past Surgical History  Procedure Laterality Date  . Cholecystectomy    . Abdominal hysterectomy    . Oophorectomy    . Bladder surgery      bladder tack  . Cervical spine surgery      cervical fusion  . Lumbar laminectomy/decompression microdiscectomy N/A 01/31/2013    Procedure: MICRO LUMBAR DECOMPRESSION L4-5;  Surgeon: Javier Docker, MD;  Location: [facility];  Service: Orthopedics;  Laterality: N/A;  Bilateral Decompression L4-L5  . Cholecystectomy open N/A   . Vaginal hysterectomy     Family History  Problem Relation Age of Onset  . Kidney failure Sister    History  Substance Use Topics   . Smoking status: Never Smoker   . Smokeless tobacco: Not on file  . Alcohol Use: No   OB History   Grav Para Term Preterm Abortions TAB SAB Ect Mult Living                 Review of Systems  Constitutional: Negative for fever and chills.  HENT: Negative for sore throat.   Respiratory: Negative for cough and shortness of breath.   Cardiovascular: Negative for chest pain and leg swelling.  Gastrointestinal: Positive for nausea, vomiting and diarrhea. Negative for abdominal pain, blood in stool and anal bleeding.  Musculoskeletal: Positive for myalgias (legs ache).  Neurological: Positive for dizziness.  All other systems reviewed and are negative.    Allergies  Shellfish allergy  Home Medications   Current Outpatient Rx  Name  Route  Sig  Dispense  Refill  . acetaminophen (TYLENOL) 500 MG tablet   Oral   Take 1,000 mg by mouth every 6 (six) hours as needed for moderate pain or headache.         Marland Kitchen aspirin 325 MG tablet   Oral   Take 325 mg by mouth daily.         . ciprofloxacin (CIPRO) 500 MG tablet   Oral   Take 1 tablet (500 mg total) by mouth 2 (two) times daily.   10 tablet   0   . citalopram (CELEXA) 20  MG tablet   Oral   Take 20 mg by mouth at bedtime.         . gabapentin (NEURONTIN) 400 MG capsule   Oral   Take 2 capsules (800 mg total) by mouth at bedtime.   60 capsule   0   . glipiZIDE (GLUCOTROL) 5 MG tablet   Oral   Take 5 mg by mouth 2 (two) times daily before a meal.         . insulin glargine (LANTUS) 100 UNIT/ML injection   Subcutaneous   Inject 50 Units into the skin 2 (two) times daily.         . insulin lispro (HUMALOG) 100 UNIT/ML injection   Subcutaneous   Inject 10-20 Units into the skin 3 (three) times daily before meals. Blood sugar below 150 use 10 units  If above 150 use 20 units         . lidocaine (LIDODERM) 5 %   Transdermal   Place 1 patch onto the skin daily. Remove & Discard patch within 12 hours or as  directed by MD   30 patch   0   . lisinopril (PRINIVIL,ZESTRIL) 40 MG tablet   Oral   Take 40 mg by mouth daily.         Marland Kitchen. morphine (MS CONTIN) 15 MG 12 hr tablet   Oral   Take 1 tablet (15 mg total) by mouth every 12 (twelve) hours.   60 tablet   0   . morphine (MSIR) 15 MG tablet   Oral   Take 15 mg by mouth every 6 (six) hours as needed (pain).         Marland Kitchen. OVER THE COUNTER MEDICATION   Both Eyes   Place 1 drop into both eyes daily as needed (dry eyes). Eye drops for dry eyes          BP 195/66  Temp(Src) 98.4 F (36.9 C)  Resp 20  SpO2 99% Physical Exam  Nursing note and vitals reviewed. Constitutional: She is oriented to person, place, and time. She appears well-developed and well-nourished. No distress.  HENT:  Head: Normocephalic and atraumatic.  Eyes: EOM are normal. Pupils are equal, round, and reactive to light.  Neck: Normal range of motion. Neck supple.  Cardiovascular: Normal rate and regular rhythm.  Exam reveals no friction rub.   No murmur heard. Pulmonary/Chest: Effort normal and breath sounds normal. No respiratory distress. She has no wheezes. She has no rales.  Abdominal: Soft. She exhibits no distension. There is no tenderness. There is no rebound.  Musculoskeletal: Normal range of motion. She exhibits no edema.  Neurological: She is alert and oriented to person, place, and time. No cranial nerve deficit. She exhibits normal muscle tone. Coordination normal.  Skin: No rash noted. She is not diaphoretic.    ED Course  Procedures (including critical care time) Labs Review Labs Reviewed  CBC  BASIC METABOLIC PANEL   Imaging Review Mr Shirlee LatchMra Head Wo Contrast  12/20/2013   CLINICAL DATA:  Altered mental status. Ataxia. Evaluate for stroke.  EXAM: MRI HEAD WITHOUT CONTRAST  MRA HEAD WITHOUT CONTRAST  TECHNIQUE: Multiplanar, multiecho pulse sequences of the brain and surrounding structures were obtained without intravenous contrast. Angiographic  images of the head were obtained using MRA technique without contrast.  COMPARISON:  Head CT 12/20/2013  FINDINGS: MRI HEAD FINDINGS  The patient was unable to complete the examination. Only axial and coronal diffusion weighted images and axial T2 weighted  images of the brain were obtained.  There is a 1 cm acute infarct in the right corona radiata. T2 hyperintensities in the subcortical and periventricular white matter and pons are compatible with mild to moderate chronic small vessel ischemic disease. Mild age-related cerebral atrophy is present. There is no gross evidence of intracranial hemorrhage, mass, midline shift, or extra-axial fluid collection. Major intracranial vascular flow voids are grossly unremarkable. Prior right cataract surgery is noted. Left maxillary sinus mucous retention cyst is present. Mastoid air cells are clear.  MRA HEAD FINDINGS  The visualized distal vertebral arteries are patent and codominant. Left PICA is unremarkable. Right PICA likely arises from the right AICA. Left AICA is also patent. Basilar artery is patent without stenosis. SCAs are patent. PCAs are patent and unremarkable. Posterior communicating arteries are not identified.  The visualized portion of the distal left cervical internal carotid artery appears tortuous. Intracranial internal carotid arteries are patent from skullbase to carotid termini. ACAs and MCAs are unremarkable. Incidental note is made of a median artery of the corpus callosum. No intracranial aneurysm is identified.  IMPRESSION: 1. Incomplete examination. Small acute infarct in the right corona radiata. 2. Mild to moderate chronic small vessel ischemic disease. 3. MRA without evidence of major intracranial arterial occlusion or high-grade stenosis.   Electronically Signed   By: Sebastian Ache   On: 12/20/2013 19:30   Mr Brain Wo Contrast  12/20/2013   CLINICAL DATA:  Altered mental status. Ataxia. Evaluate for stroke.  EXAM: MRI HEAD WITHOUT CONTRAST   MRA HEAD WITHOUT CONTRAST  TECHNIQUE: Multiplanar, multiecho pulse sequences of the brain and surrounding structures were obtained without intravenous contrast. Angiographic images of the head were obtained using MRA technique without contrast.  COMPARISON:  Head CT 12/20/2013  FINDINGS: MRI HEAD FINDINGS  The patient was unable to complete the examination. Only axial and coronal diffusion weighted images and axial T2 weighted images of the brain were obtained.  There is a 1 cm acute infarct in the right corona radiata. T2 hyperintensities in the subcortical and periventricular white matter and pons are compatible with mild to moderate chronic small vessel ischemic disease. Mild age-related cerebral atrophy is present. There is no gross evidence of intracranial hemorrhage, mass, midline shift, or extra-axial fluid collection. Major intracranial vascular flow voids are grossly unremarkable. Prior right cataract surgery is noted. Left maxillary sinus mucous retention cyst is present. Mastoid air cells are clear.  MRA HEAD FINDINGS  The visualized distal vertebral arteries are patent and codominant. Left PICA is unremarkable. Right PICA likely arises from the right AICA. Left AICA is also patent. Basilar artery is patent without stenosis. SCAs are patent. PCAs are patent and unremarkable. Posterior communicating arteries are not identified.  The visualized portion of the distal left cervical internal carotid artery appears tortuous. Intracranial internal carotid arteries are patent from skullbase to carotid termini. ACAs and MCAs are unremarkable. Incidental note is made of a median artery of the corpus callosum. No intracranial aneurysm is identified.  IMPRESSION: 1. Incomplete examination. Small acute infarct in the right corona radiata. 2. Mild to moderate chronic small vessel ischemic disease. 3. MRA without evidence of major intracranial arterial occlusion or high-grade stenosis.   Electronically Signed   By:  Sebastian Ache   On: 12/20/2013 19:30    EKG Interpretation   None       MDM   1. Nausea and vomiting    40F presents with N/V/D. Recent admission for CVA, discharged this morning.  Decreased PO intake while in the hospital per her, and she had diarrhea last night. Patient today had multiple episodes of vomiting and diarrhea. No blood in either. Had ARF in the hospital, thought to be secondary to dehydration.  Hypertensive here, strong in all extremities with normal sensation. No abdominal pain. Will give zofran, check labs.  Patient extremely nervous. He started today her complaint now is for leg pains and not nausea vomiting. In her record review, she was decreased on her narcotics and gabapentin, was not fully stopped on these. I do not think she is experiencing gabapentin or narcotic withdrawal. She's extremely anxious and nervous. She does not want to wait for her labs to result. She has medicines at home. She states she would just like to leave and she'll call with her PCP in 2 days. Patient is stable for discharge. She agreed to sign AMA as her labs have not resulted and I want to be sure she's not overly dehydrated. Her lactate was back and it was normal. Patient discharged home.   Dagmar Hait, MD 12/22/13 [id number]

## 2013-12-22 NOTE — Discharge Instructions (Signed)

## 2013-12-22 NOTE — Discharge Summary (Addendum)
Physician Discharge Summary  Virginia [name] WGN:[id number] DOB: September 08, 1946 DOA: 12/19/2013  PCP: Selinda Flavin, MD  Admit date: 12/19/2013 Discharge date: 12/22/2013  Time spent: 35 minutes  Recommendations for Outpatient Follow-up:  1. Limit pain medications  2. Limit gabapentin as was toxic 3. Cbc, bmp in 1 week 4. Home health- PT/OT  Discharge Diagnoses:  Active Problems:   Vertigo   Dehydration   Acute renal failure   Anemia   Fall at home   Ataxia   Hypotension   Hypertension   Diabetes mellitus without complication   Chronic pain   Hypoxia   Gabapentin-induced toxicity   Discharge Condition: improved  Diet recommendation: cardiac/diabetic  Filed Weights   12/20/13 2145 12/22/13 0500  Weight: 104.69 kg (230 lb 12.8 oz) 102.541 kg (226 lb 1 oz)    History of present illness:  SHUNTIA EXTON is a 67 y.o. female with history of IDDM, hypertension, remote stroke with right eye blindness, left Bell's palsy with facial asymmetry, chronic back pain on opioids, presented to the ED with complaints of dizziness and a fall this morning. Patient states that she has had intermittent dizziness which she describes as "world spinning around" for approximately 2 months but did not bother her significantly. Her symptoms seem to have worsened in the last 4 days. This vertigo seems to occur both when she's lying down and worse in upright position and ambulation. She denies headache, earache, tinnitus or sore throat. It progressively got worse where she had to ambulate with the help of holding on to furniture and walls at home. Her appetite has decreased but she denies abdominal pain or diarrhea. She had a single episode of vomiting 2 days back. She denies fever, chills, chest pain, palpitations, cough or dyspnea. She has chronic back pain that has not changed. Her symptoms seem to have even worse and this morning when she tried to get up and use the bathroom, felt dizzy and fell face forward  and chipped a tooth. She denies loss of consciousness. Her husband noticed slurred speech but no asymmetric weakness. He states that 2 days ago she was walking around like she was "drunk". In the ED, patient was hypotensive, hypoxic, CT head negative for acute findings, creatinine 2.73 and hemoglobin 11.7. Chest x-ray negative for acute findings. Hospitalist admission requested.   Hospital Course:  Vertigo/gait ataxia/fall and chipped tooth  - prob due to UTI +/- CVA as well as gabapentin toxicity - MRI and MRA brain without contrast: Small acute infarct in the right corona  radiata.  - Neurology consulted suspect stoke is incidental finding - ASA  - PT and OT evaluation.- home health  Fever  -r/o flu  prob UTI- rocephin- ecoli- change to cipro at d/c  Dehydration/hypotension  - Secondary to poor oral intake, diuretics and antihypertensives  - Hold culprit medications    Acute renal failure  - Secondary to dehydration, diuretics and ACE inhibitors.  - Treatment as above and trend daily BMP.  -resolved   Hypoxia - resolved - Unclear etiology. No acute findings on chest x-ray and no respiratory symptoms.  - As per spouse, patient does have history of snoring and apneic spells at night.  - DD: OSA/OHS. No history of recent travel. VQ scan no PE  -outpatient OSA work up  IDDM  - monitor blood sugars -titrate as necessary  Chronic back pain  - decrease pain meds as kidney function is worsening  -lidocaine patch       Procedures:  Echo: Left ventricle: The cavity size was normal. Wall thickness was increased in a pattern of mild LVH. Systolic function was normal. The estimated ejection fraction was in the range of 60% to 65%. Wall motion was normal; there were no regional wall motion abnormalities. Features are consistent with a pseudonormal left ventricular filling pattern, with concomitant abnormal relaxation and increased filling pressure (grade 2  diastolic dysfunction). - Aortic valve: There was no stenosis. - Mitral valve: Mildly calcified annulus. Mildly calcified leaflets . Mild regurgitation. - Left atrium: The atrium was mildly dilated. - Right ventricle: The cavity size was normal. Systolic function was normal. - Right atrium: The atrium was mildly dilated. - Tricuspid valve: Peak RV-RA gradient:61mm Hg (S). - Pulmonary arteries: PA peak pressure: 39mm Hg (S). - Systemic veins: IVC measured 2.2 cm with normal respirophasic variation, suggesting RA pressure 8 mmHg. Impressions:  - Normal LV size with mild LV hypertrophy. EF 60-65%. Moderate diastolic dysfunction. Normal RV size and systolic function. No significant valvular abnormalities. Mild pulmonary hypertension.    Consultations:  neuro  Discharge Exam: Filed Vitals:   12/22/13 0524  BP: 150/52  Pulse: 76  Temp: 98.1 F (36.7 C)  Resp: 20    General: up walking the hallways, asking to go home Cardiovascular: rrr Respiratory: clear anterior  Discharge Instructions      Discharge Orders   Future Orders Complete By Expires   Diet - low sodium heart healthy  As directed    Diet Carb Modified  As directed    Discharge instructions  As directed    Comments:     Home health PT/OT Monitor blood sugars and bring to PCP Avoid sedating meds No driving   Increase activity slowly  As directed        Medication List    STOP taking these medications       BENADRYL PO     hydrochlorothiazide 25 MG tablet  Commonly known as:  HYDRODIURIL      TAKE these medications       acetaminophen 500 MG tablet  Commonly known as:  TYLENOL  Take 1,000 mg by mouth every 6 (six) hours as needed for moderate pain or headache.     aspirin 325 MG tablet  Take 325 mg by mouth daily.     ciprofloxacin 500 MG tablet  Commonly known as:  CIPRO  Take 1 tablet (500 mg total) by mouth 2 (two) times daily.     citalopram 20 MG tablet  Commonly known as:   CELEXA  Take 20 mg by mouth at bedtime.     gabapentin 400 MG capsule  Commonly known as:  NEURONTIN  Take 2 capsules (800 mg total) by mouth at bedtime.     glipiZIDE 5 MG tablet  Commonly known as:  GLUCOTROL  Take 5 mg by mouth 2 (two) times daily before a meal.     insulin glargine 100 UNIT/ML injection  Commonly known as:  LANTUS  Inject 50 Units into the skin 2 (two) times daily.     insulin lispro 100 UNIT/ML injection  Commonly known as:  HUMALOG  - Inject 10-20 Units into the skin 3 (three) times daily before meals. Blood sugar below 150 use 10 units   - If above 150 use 20 units     lidocaine 5 %  Commonly known as:  LIDODERM  Place 1 patch onto the skin daily. Remove & Discard patch within 12 hours or as directed by MD  lisinopril 40 MG tablet  Commonly known as:  PRINIVIL,ZESTRIL  Take 40 mg by mouth daily.     morphine 15 MG tablet  Commonly known as:  MSIR  Take 15 mg by mouth every 6 (six) hours as needed (pain).     morphine 15 MG 12 hr tablet  Commonly known as:  MS CONTIN  Take 1 tablet (15 mg total) by mouth every 12 (twelve) hours.     OVER THE COUNTER MEDICATION  Place 1 drop into both eyes daily as needed (dry eyes). Eye drops for dry eyes       Allergies  Allergen Reactions  . Shellfish Allergy Anaphylaxis      The results of significant diagnostics from this hospitalization (including imaging, microbiology, ancillary and laboratory) are listed below for reference.    Significant Diagnostic Studies: Dg Chest 2 View  12/19/2013   CLINICAL DATA:  Dizziness, fall, shortness of breath.  EXAM: CHEST  2 VIEW  COMPARISON:  PA and lateral chest 01/25/2013.  FINDINGS: The lungs are clear. Heart size is normal. There is no pneumothorax or pleural effusion. No focal bony abnormality is identified.  IMPRESSION: No acute disease.  Stable compared to prior exam.   Electronically Signed   By: Drusilla Kanner M.D.   On: 12/19/2013 10:53   Ct Head Wo  Contrast  12/20/2013   CLINICAL DATA:  Altered mental status; decreased mental acuity.  EXAM: CT HEAD WITHOUT CONTRAST  TECHNIQUE: Contiguous axial images were obtained from the base of the skull through the vertex without intravenous contrast.  COMPARISON:  CT of the head performed 12/19/2013  FINDINGS: There is no evidence of acute infarction, mass lesion, or intra- or extra-axial hemorrhage on CT.  Mild periventricular white matter change likely reflects small vessel ischemic microangiopathy.  The posterior fossa, including the cerebellum, brainstem and fourth ventricle, is within normal limits. The third and lateral ventricles, and basal ganglia are unremarkable in appearance. The cerebral hemispheres are symmetric in appearance, with normal gray-white differentiation. No mass effect or midline shift is seen.  There is no evidence of fracture; visualized osseous structures are unremarkable in appearance. The orbits are within normal limits. The paranasal sinuses and mastoid air cells are well-aerated. No significant soft tissue abnormalities are seen.  IMPRESSION: 1. No acute intracranial pathology seen on CT. 2. Mild small vessel ischemic microangiopathy.   Electronically Signed   By: Roanna Raider M.D.   On: 12/20/2013 00:57   Ct Head Wo Contrast  12/19/2013   CLINICAL DATA:  History of fall with injury to the face.  EXAM: CT HEAD WITHOUT CONTRAST  TECHNIQUE: Contiguous axial images were obtained from the base of the skull through the vertex without intravenous contrast.  COMPARISON:  No priors.  FINDINGS: No acute displaced skull fractures are identified. No acute intracranial abnormality. Specifically, no evidence of acute post-traumatic intracranial hemorrhage, no definite regions of acute/subacute cerebral ischemia, no focal mass, mass effect, hydrocephalus or abnormal intra or extra-axial fluid collections. The visualized paranasal sinuses and mastoids are well pneumatized.  IMPRESSION: 1. No acute  displaced skull fractures or acute intracranial abnormalities. 2. The appearance of the brain is normal.   Electronically Signed   By: Trudie Reed M.D.   On: 12/19/2013 10:50   Mr Maxine Glenn Head Wo Contrast  12/20/2013   CLINICAL DATA:  Altered mental status. Ataxia. Evaluate for stroke.  EXAM: MRI HEAD WITHOUT CONTRAST  MRA HEAD WITHOUT CONTRAST  TECHNIQUE: Multiplanar, multiecho pulse sequences of the  brain and surrounding structures were obtained without intravenous contrast. Angiographic images of the head were obtained using MRA technique without contrast.  COMPARISON:  Head CT 12/20/2013  FINDINGS: MRI HEAD FINDINGS  The patient was unable to complete the examination. Only axial and coronal diffusion weighted images and axial T2 weighted images of the brain were obtained.  There is a 1 cm acute infarct in the right corona radiata. T2 hyperintensities in the subcortical and periventricular white matter and pons are compatible with mild to moderate chronic small vessel ischemic disease. Mild age-related cerebral atrophy is present. There is no gross evidence of intracranial hemorrhage, mass, midline shift, or extra-axial fluid collection. Major intracranial vascular flow voids are grossly unremarkable. Prior right cataract surgery is noted. Left maxillary sinus mucous retention cyst is present. Mastoid air cells are clear.  MRA HEAD FINDINGS  The visualized distal vertebral arteries are patent and codominant. Left PICA is unremarkable. Right PICA likely arises from the right AICA. Left AICA is also patent. Basilar artery is patent without stenosis. SCAs are patent. PCAs are patent and unremarkable. Posterior communicating arteries are not identified.  The visualized portion of the distal left cervical internal carotid artery appears tortuous. Intracranial internal carotid arteries are patent from skullbase to carotid termini. ACAs and MCAs are unremarkable. Incidental note is made of a median artery of the  corpus callosum. No intracranial aneurysm is identified.  IMPRESSION: 1. Incomplete examination. Small acute infarct in the right corona radiata. 2. Mild to moderate chronic small vessel ischemic disease. 3. MRA without evidence of major intracranial arterial occlusion or high-grade stenosis.   Electronically Signed   By: Sebastian Ache   On: 12/20/2013 19:30   Mr Brain Wo Contrast  12/20/2013   CLINICAL DATA:  Altered mental status. Ataxia. Evaluate for stroke.  EXAM: MRI HEAD WITHOUT CONTRAST  MRA HEAD WITHOUT CONTRAST  TECHNIQUE: Multiplanar, multiecho pulse sequences of the brain and surrounding structures were obtained without intravenous contrast. Angiographic images of the head were obtained using MRA technique without contrast.  COMPARISON:  Head CT 12/20/2013  FINDINGS: MRI HEAD FINDINGS  The patient was unable to complete the examination. Only axial and coronal diffusion weighted images and axial T2 weighted images of the brain were obtained.  There is a 1 cm acute infarct in the right corona radiata. T2 hyperintensities in the subcortical and periventricular white matter and pons are compatible with mild to moderate chronic small vessel ischemic disease. Mild age-related cerebral atrophy is present. There is no gross evidence of intracranial hemorrhage, mass, midline shift, or extra-axial fluid collection. Major intracranial vascular flow voids are grossly unremarkable. Prior right cataract surgery is noted. Left maxillary sinus mucous retention cyst is present. Mastoid air cells are clear.  MRA HEAD FINDINGS  The visualized distal vertebral arteries are patent and codominant. Left PICA is unremarkable. Right PICA likely arises from the right AICA. Left AICA is also patent. Basilar artery is patent without stenosis. SCAs are patent. PCAs are patent and unremarkable. Posterior communicating arteries are not identified.  The visualized portion of the distal left cervical internal carotid artery appears  tortuous. Intracranial internal carotid arteries are patent from skullbase to carotid termini. ACAs and MCAs are unremarkable. Incidental note is made of a median artery of the corpus callosum. No intracranial aneurysm is identified.  IMPRESSION: 1. Incomplete examination. Small acute infarct in the right corona radiata. 2. Mild to moderate chronic small vessel ischemic disease. 3. MRA without evidence of major intracranial arterial occlusion or  high-grade stenosis.   Electronically Signed   By: Sebastian AcheAllen  Grady   On: 12/20/2013 19:30   [place] Renal  12/20/2013   CLINICAL DATA:  Acute renal failure.  EXAM: RENAL/URINARY TRACT ULTRASOUND COMPLETE  COMPARISON:  None.  FINDINGS: Right Kidney:  Length: 12.3 cm. Echogenicity within normal limits. No mass or hydronephrosis visualized.  Left Kidney:  Length: 12.1 cm. Echogenicity within normal limits. No mass or hydronephrosis visualized.  Bladder:  Appears normal for degree of bladder distention.  IMPRESSION: Normal exam.   Electronically Signed   By: Geanie CooleyJim  Maxwell M.D.   On: 12/20/2013 16:09   Nm Pulmonary Perf And Vent  12/19/2013   CLINICAL DATA:  Shortness of breath. History of hypertension, stroke and dizziness appear  EXAM: NUCLEAR MEDICINE VENTILATION - PERFUSION LUNG SCAN  TECHNIQUE: Ventilation images were obtained in multiple projections using inhaled aerosol technetium 99 M DTPA. Perfusion images were obtained in multiple projections after intravenous injection of Tc-2942m MAA.  COMPARISON:  Chest radiographs same date.  RADIOPHARMACEUTICALS:  40.0 mCi Tc-6142m DTPA aerosol and 6.0 mCi Tc-6342m MAA  FINDINGS: Ventilation: There is mild central airway clumping of the aerosol. No focal ventilation defects are demonstrated.  Perfusion: No wedge shaped peripheral perfusion defects to suggest acute pulmonary embolism.  IMPRESSION: Very low probability for acute pulmonary embolism.   Electronically Signed   By: Roxy HorsemanBill  Veazey M.D.   On: 12/19/2013 16:32     Microbiology: Recent Results (from the past 240 hour(s))  URINE CULTURE     Status: None   Collection Time    12/19/13 12:05 PM      Result Value Range Status   Specimen Description URINE, CLEAN CATCH   Final   Special Requests NONE   Final   Culture  Setup Time     Final   Value: 12/19/2013 13:50     Performed at [facility]   Colony Count     Final   Value: >=100,000 COLONIES/ML     Performed at [facility]   Culture     Final   Value: ESCHERICHIA COLI     GROUP B STREP(S.AGALACTIAE)ISOLATED     Note: TESTING AGAINST S. AGALACTIAE NOT ROUTINELY PERFORMED DUE TO PREDICTABILITY OF AMP/PEN/VAN SUSCEPTIBILITY.     Performed at [facility]   Report Status 12/21/2013 FINAL   Final   Organism ID, Bacteria ESCHERICHIA COLI   Final  MRSA PCR SCREENING     Status: None   Collection Time    12/19/13  4:50 PM      Result Value Range Status   MRSA by PCR NEGATIVE  NEGATIVE Final   Comment:            The GeneXpert MRSA Assay (FDA     approved for NASAL specimens     only), is one component of a     comprehensive MRSA colonization     surveillance program. It is not     intended to diagnose MRSA     infection nor to guide or     monitor treatment for     MRSA infections.     Labs: Basic Metabolic Panel:  Recent Labs Lab 12/19/13 0042 12/19/13 1021 12/19/13 1034 12/21/13 0500 12/22/13 0440  NA 134* 132* 132* 138 140  K 5.4* 5.3 5.2 4.7 3.9  CL 100 95* 99 104 104  CO2 19 21  --  23 21  GLUCOSE 134* 139* 139* 102* 70  BUN 84* 84* 104*  39* 20  CREATININE 2.24* 2.73* 2.90* 0.77 0.61  CALCIUM 7.8* 8.0*  --  8.5 8.6  MG 2.3  --   --   --   --    Liver Function Tests:  Recent Labs Lab 12/19/13 1021  AST 29  ALT 16  ALKPHOS 76  BILITOT 0.4  PROT 6.8  ALBUMIN 3.2*   No results found for this basename: LIPASE, AMYLASE,  in the last 168 hours No results found for this basename: AMMONIA,  in the last 168 hours CBC:  Recent Labs Lab  12/19/13 0042 12/19/13 1021 12/19/13 1034 12/21/13 0500 12/22/13 0440  WBC 9.9 10.1  --  7.8 9.6  NEUTROABS  --  5.4  --   --   --   HGB 10.7* 11.7* 12.2 10.1* 10.3*  HCT 31.7* 34.9* 36.0 29.0* 29.9*  MCV 91.1 92.6  --  89.5 87.9  PLT 215 254  --  177 203   Cardiac Enzymes:  Recent Labs Lab 12/19/13 1021  TROPONINI <0.30   BNP: BNP (last 3 results)  Recent Labs  12/19/13 1021  PROBNP 19.4   CBG:  Recent Labs Lab 12/21/13 1353 12/21/13 1739 12/21/13 2207 12/22/13 0623 12/22/13 0701  GLUCAP 142* 134* 80 57* 111*       Signed:  Alexx Giambra  [facility] 12/22/2013, 9:36 AM

## 2013-12-22 NOTE — Progress Notes (Signed)
Occupational Therapy Treatment and Discharge Patient Details Name: Virginia [name] MRN: [id number] DOB: 11-Aug-1946 Today's Date: 12/22/2013 Time: 3845-3646 OT Time Calculation (min): 9 min  OT Assessment / Plan / Recommendation  History of present illness pt presents with 3-4 day hx of confusion, tremors, and weakness.  Found to have gabapentin toxicity.   OT comments  Pt and husband report that pt is back at her baseline level of functioning this morning. No new visual issues noted with further testing. Acute OT will sign off.  Follow Up Recommendations  No OT follow up       Equipment Recommendations  None recommended by OT          Progress towards OT Goals Progress towards OT goals: Goals met/education completed, patient discharged from [facility] Discharge plan needs to be updated    Precautions / Restrictions Precautions Precautions: Fall Restrictions Weight Bearing Restrictions: No   Pertinent Vitals/Pain Achy in her back    ADL  Transfers/Ambulation Related to ADLs: Pt's husband reports that he and pt walked around the entire unit earlier this AM without AD and pt did fine.  ADL Comments: Pt appears much better than she did yesterday afternoon. Not as fidgety, labile, or anxious. Able to follow all commands for visual testing today and maintain wakeful state. Pt with intact occulomotor movements, tracking, saccades and visual fields (with only deficits being that that was pre-exsisting of decreased acuity in right eye from previous stroke).      OT Goals(current goals can now be found in the care plan section)    Visit Information  Last OT Received On: 12/22/13 Assistance Needed: +1 History of Present Illness: pt presents with 3-4 day hx of confusion, tremors, and weakness.  Found to have gabapentin toxicity.          Cognition  Cognition Arousal/Alertness: Awake/alert Behavior During Therapy: WFL for tasks assessed/performed Overall Cognitive Status: Within  Functional Limits for tasks assessed    Mobility  Bed Mobility Overal bed mobility: Independent Transfers Overall transfer level: Independent          End of Session OT - End of Session Equipment Utilized During Treatment:  (None) Activity Tolerance: Patient tolerated treatment well Patient left: in bed;with call bell/phone within reach;with family/visitor present       Almon Register [phone] 12/22/2013, 11:32 AM

## 2013-12-22 NOTE — Progress Notes (Signed)
Subjective: Cotninues to improve  Exam: Filed Vitals:   12/22/13 0524  BP: 150/52  Pulse: 76  Temp: 98.1 F (36.7 C)  Resp: 20   Gen: In bed, NAD MS: Awake, alert, interactive and appropriate QM:VHQIOCN:PERRL, EOMI Motor: MAEW, possibly mild left leg weakness, though this would be very mild and it is not clear to me that it is truly weak Sensation: intact to temp  MRI brain reviewed- right corona radiata infarct.   Impression: [age] yo F with dizziness in the setting of multiple medical problems. I think that most of her symptoms represent gabapentin toxicity. She has a very high dose and her renal function was impaired. She is on it for neuropathy. She is not sure she was benefiting from it, so will not restart it. Stroke likely due to small vessel disease.   tPA was not administered due to unknown time of onset and stroke not initially suspected. She was not taking ASA regularly prior to admission(woudl take it 2 times per week per patient).   Recommendations: 1) Echo with no clear embolic source.  2) LDL 91, ok 3) A1C pending 4) carotids with no sig stenosis 5) could reinstitue gabapentin at 800mg  QHS for RLS.  6) Will sign off at this time, please call with any further questions.   Ritta SlotMcNeill Rosaria Kubin, MD [facility] [phone]  If 7pm- 7am, please page neurology on call at [phone].

## 2014-08-20 ENCOUNTER — Emergency Department (HOSPITAL_COMMUNITY)
Admission: EM | Admit: 2014-08-20 | Discharge: 2014-08-20 | Disposition: A | Discharge status: Home / Self Care | Payer: Medicare HMO | Attending: Emergency Medicine | Admitting: Emergency Medicine

## 2014-08-20 ENCOUNTER — Emergency Department (HOSPITAL_COMMUNITY): Payer: Medicare HMO

## 2014-08-20 ENCOUNTER — Encounter (HOSPITAL_COMMUNITY): Payer: Self-pay | Admitting: Emergency Medicine

## 2014-08-20 DIAGNOSIS — G510 Bell's palsy: Secondary | ICD-10-CM | POA: Diagnosis not present

## 2014-08-20 DIAGNOSIS — IMO0002 Reserved for concepts with insufficient information to code with codable children: Secondary | ICD-10-CM | POA: Insufficient documentation

## 2014-08-20 DIAGNOSIS — R29810 Facial weakness: Secondary | ICD-10-CM | POA: Insufficient documentation

## 2014-08-20 DIAGNOSIS — Z8673 Personal history of transient ischemic attack (TIA), and cerebral infarction without residual deficits: Secondary | ICD-10-CM | POA: Diagnosis not present

## 2014-08-20 DIAGNOSIS — Z794 Long term (current) use of insulin: Secondary | ICD-10-CM | POA: Insufficient documentation

## 2014-08-20 DIAGNOSIS — E119 Type 2 diabetes mellitus without complications: Secondary | ICD-10-CM | POA: Diagnosis not present

## 2014-08-20 DIAGNOSIS — Z7982 Long term (current) use of aspirin: Secondary | ICD-10-CM | POA: Insufficient documentation

## 2014-08-20 DIAGNOSIS — M129 Arthropathy, unspecified: Secondary | ICD-10-CM | POA: Diagnosis not present

## 2014-08-20 DIAGNOSIS — Z79899 Other long term (current) drug therapy: Secondary | ICD-10-CM | POA: Insufficient documentation

## 2014-08-20 MED ORDER — PREDNISONE 20 MG PO TABS: ORAL_TABLET | ORAL | Status: DC

## 2014-08-20 MED ORDER — VALACYCLOVIR HCL 1 G PO TABS: 500.0000 mg | ORAL_TABLET | Freq: Two times a day (BID) | ORAL | Status: AC

## 2014-08-20 NOTE — ED Provider Notes (Signed)
CSN: [id number]     Arrival date & time 08/20/14  1615 History  This chart was scribed for Donnetta Hutching, MD, by Yevette Edwards, ED Scribe. This patient was seen in room APA02/APA02 and the patient's care was started at 4:23 PM.   First MD Initiated Contact with Patient 08/20/14 1622     Chief Complaint  Patient presents with  . Shortness of Breath  . Facial Droop   The history is provided by the patient. No language interpreter was used.   HPI Comments: Virginia [name] is a 68 y.o. female, with a h/o Bell's palsy and a stroke,  who presents to the Emergency Department complaining of persistent right-sided asymetry to her face, onset Sunday, three days ago. She reports a droopiness to her right eye, watery discharge to her right eye, as well as an abnormal sensation to her right cheek. [name] also reports mild numbness to the tongue, and she reports a decreased ability to full open her mouth. She denies pain to her eye. She also denies weakness or numbness to her extremities, confusion, disorientation, or slurred speech. She endorses a h/o Bell palsy to the right side. Additionally, [name] reports cold symptoms which include rhinorrhea and SOB. She endorses a stroke nine months ago in January 2015; she denies subsequent impairment. Ms. Min also voices a h/o DM and HTN. She is a non-smoker.    Dr. Dimas Aguas in [place], [place] is the pt's PCP. Dr. Daphine Deutscher in [place], [place] is the pt's optometrist.   Past Medical History  Diagnosis Date  . Hypertension   . Stroke     behind rt eye / visual defficiet rt eye  . Spinal stenosis of lumbar region   . Arthritis   . Difficulty sleeping     due to back pain  . Diabetes mellitus without complication     fasting 120-130s  . History of blood transfusion   . H/O Bell's palsy left   Past Surgical History  Procedure Laterality Date  . Cholecystectomy    . Abdominal hysterectomy    . Oophorectomy    . Bladder surgery      bladder tack  . Cervical  spine surgery      cervical fusion  . Lumbar laminectomy/decompression microdiscectomy N/A 01/31/2013    Procedure: MICRO LUMBAR DECOMPRESSION L4-5;  Surgeon: Javier Docker, MD;  Location: [facility];  Service: Orthopedics;  Laterality: N/A;  Bilateral Decompression L4-L5  . Cholecystectomy open N/A   . Vaginal hysterectomy     Family History  Problem Relation Age of Onset  . Kidney failure Sister    History  Substance Use Topics  . Smoking status: Never Smoker   . Smokeless tobacco: Not on file  . Alcohol Use: No   No OB history provided.  Review of Systems  A complete 10 system review of systems was obtained, and all systems were negative except where indicated in the HPI and PE.   Allergies  Shellfish allergy  Home Medications   Prior to Admission medications   Medication Sig Start Date End Date Taking? Authorizing Provider  acetaminophen (TYLENOL) 500 MG tablet Take 1,000 mg by mouth every 6 (six) hours as needed for moderate pain or headache.   Yes Historical Provider, MD  aspirin 325 MG tablet Take 325 mg by mouth daily.   Yes Historical Provider, MD  diphenhydrAMINE (BENADRYL) 25 MG tablet Take 50 mg by mouth at bedtime as needed for sleep.    Yes Historical  Provider, MD  DULoxetine (CYMBALTA) 60 MG capsule Take 60 mg by mouth daily.   Yes Historical Provider, MD  fentaNYL (DURAGESIC - DOSED MCG/HR) 100 MCG/HR Place 100 mcg onto the skin every 3 (three) days.   Yes Historical Provider, MD  glipiZIDE (GLUCOTROL) 5 MG tablet Take 5 mg by mouth 2 (two) times daily before a meal.   Yes Historical Provider, MD  hydrochlorothiazide (HYDRODIURIL) 25 MG tablet Take 25 mg by mouth daily as needed (for fluid).   Yes Historical Provider, MD  insulin NPH Human (HUMULIN N,NOVOLIN N) 100 UNIT/ML injection Inject 40 Units into the skin 2 (two) times daily. *May take a third dose of 40 units for high blood sugars*   Yes Historical Provider, MD  lisinopril (PRINIVIL,ZESTRIL) 40 MG tablet Take  40 mg by mouth daily.   Yes Historical Provider, MD  LORazepam (ATIVAN) 2 MG tablet Take 2 mg by mouth at bedtime as needed for anxiety (and/or sleep).   Yes Historical Provider, MD  predniSONE (DELTASONE) 20 MG tablet 3 tabs po daily x 3 days, then 2 tabs x 3 days, then 1.5 tabs x 3 days, then 1 tab x 3 days, then 0.5 tabs x 3 days 08/20/14   Donnetta Hutching, MD  valACYclovir (VALTREX) 1000 MG tablet Take 0.5 tablets (500 mg total) by mouth 2 (two) times daily. 08/20/14 09/03/14  Donnetta Hutching, MD   Triage Vitals: BP 193/76  Pulse 83  Temp(Src) 98.6 F (37 C) (Oral)  Resp 18  Ht 5' (1.524 m)  Wt 230 lb (104.327 kg)  BMI 44.92 kg/m2  SpO2 97%  Physical Exam  Nursing note and vitals reviewed. Constitutional: She is oriented to person, place, and time. She appears well-developed and well-nourished.  HENT:  Head: Normocephalic and atraumatic.  Eye watering on right. Numbness sensation on cheek.  Tongue exam looked normal.  Seemed like she was not closing her right eye as vigorously.   Eyes: Conjunctivae and EOM are normal. Pupils are equal, round, and reactive to light.  Neck: Normal range of motion. Neck supple.  Cardiovascular: Normal rate, regular rhythm and normal heart sounds.   Pulmonary/Chest: Effort normal and breath sounds normal.  Abdominal: Soft. Bowel sounds are normal.  Musculoskeletal: Normal range of motion.  Neurological: She is alert and oriented to person, place, and time.  Skin: Skin is warm and dry.  Psychiatric: She has a normal mood and affect. Her behavior is normal.    ED Course  Procedures (including critical care time)  DIAGNOSTIC STUDIES: Oxygen Saturation is 97% on room air, normal by my interpretation.    COORDINATION OF CARE:  4:51 PM- Discussed treatment plan with patient, and the patient agreed to the plan. The plan includes a course of prednisone as well as a chest x-ray. Encouraged the pt to follow-up with Dr. Daphine Deutscher re the watery eye.   Dg Chest 2  View  08/20/2014   CLINICAL DATA:  Cough, shortness of breath, RIGHT facial droop for 3 days, history diabetes, stroke, hypertension  EXAM: CHEST  2 VIEW  COMPARISON:  12/18/2013  FINDINGS: Upper normal heart size.  Normal mediastinal contours and pulmonary vascularity.  Lungs clear.  No pleural effusion or pneumothorax.  Prior cervical spine fusion.  No acute bony findings.  IMPRESSION: No acute abnormalities.   Electronically Signed   By: Ulyses Southward M.D.   On: 08/20/2014 17:18    EKG Interpretation   Date/Time:  Tuesday August 20 2014 16:27:07 EDT Ventricular Rate:  70 PR Interval:  157 QRS Duration: 89 QT Interval:  402 QTC Calculation: 434 R Axis:   -3 Text Interpretation:  Sinus rhythm Confirmed by Adriana Simas  MD, Nevelyn Mellott ([id number]) on  08/20/2014 4:57:28 PM      MDM   Final diagnoses:  Bell's palsy   History and physical consistent with Bell's palsy. No clinical evidence of a stroke. Discharge medications prednisone taper and valacyclovir 500 mg twice a day for 5 days.  Chest x-ray negative. Patient has primary care followup.   I personally performed the services described in this documentation, which was scribed in my presence. The recorded information has been reviewed and is accurate.    Donnetta Hutching, MD 08/20/14 Zollie Pee

## 2014-08-20 NOTE — Discharge Instructions (Signed)
Bell's Palsy Bell's palsy is a condition in which the muscles on one side of the face cannot move (paralysis). This is because the nerves in the face are paralyzed. It is most often thought to be caused by a virus. The virus causes swelling of the nerve that controls movement on one side of the face. The nerve travels through a tight space surrounded by bone. When the nerve swells, it can be compressed by the bone. This results in damage to the protective covering around the nerve. This damage interferes with how the nerve communicates with the muscles of the face. As a result, it can cause weakness or paralysis of the facial muscles.  Injury (trauma), tumor, and surgery may cause Bell's palsy, but most of the time the cause is unknown. It is a relatively common condition. It starts suddenly (abrupt onset) with the paralysis usually ending within 2 days. Bell's palsy is not dangerous. But because the eye does not close properly, you may need care to keep the eye from getting dry. This can include splinting (to keep the eye shut) or moistening with artificial tears. Bell's palsy very seldom occurs on both sides of the face at the same time. SYMPTOMS   Eyebrow sagging.  Drooping of the eyelid and corner of the mouth.  Inability to close one eye.  Loss of taste on the front of the tongue.  Sensitivity to loud noises. TREATMENT  The treatment is usually non-surgical. If the patient is seen within the first 24 to 48 hours, a short course of steroids may be prescribed, in an attempt to shorten the length of the condition. Antiviral medicines may also be used with the steroids, but it is unclear if they are helpful.  You will need to protect your eye, if you cannot close it. The cornea (clear covering over your eye) will become dry and can be damaged. Artificial tears can be used to keep your eye moist. Glasses or an eye patch should be worn to protect your eye. PROGNOSIS  Recovery is variable, ranging  from days to months. Although the problem usually goes away completely (about 80% of cases resolve), predicting the outcome is impossible. Most people improve within 3 weeks of when the symptoms began. Improvement may continue for 3 to 6 months. A small number of people have moderate to severe weakness that is permanent.  HOME CARE INSTRUCTIONS   If your caregiver prescribed medication to reduce swelling in the nerve, use as directed. Do not stop taking the medication unless directed by your caregiver.  Use moisturizing eye drops as needed to prevent drying of your eye, as directed by your caregiver.  Protect your eye, as directed by your caregiver.  Use facial massage and exercises, as directed by your caregiver.  Perform your normal activities, and get your normal rest. SEEK IMMEDIATE MEDICAL CARE IF:   There is pain, redness or irritation in the eye.  You or your child has an oral temperature above 102 F (38.9 C), not controlled by medicine. MAKE SURE YOU:   Understand these instructions.  Will watch your condition.  Will get help right away if you are not doing well or get worse. Document Released: 11/15/2005 Document Revised: 02/07/2012 Document Reviewed: 02/22/2014 [facility] Patient Information 2015 [place], [place]. This information is not intended to replace advice given to you by your health care provider. Make sure you discuss any questions you have with your health care provider.   Chest x-ray was normal. Prescriptions for prednisone  and antiviral medication. Followup your primary care Dr.

## 2014-08-20 NOTE — ED Notes (Signed)
Patient complaining of right sided facial droop and shortness of breath starting Sunday. States "I have Bell's Palsy and I think it's happening again." Patient alert and oriented at triage.

## 2015-07-28 ENCOUNTER — Encounter (HOSPITAL_COMMUNITY): Payer: Self-pay | Admitting: *Deleted

## 2015-07-28 ENCOUNTER — Emergency Department (HOSPITAL_COMMUNITY)
Admission: EM | Admit: 2015-07-28 | Discharge: 2015-07-28 | Disposition: A | Discharge status: Home / Self Care | Payer: Medicare Other | Attending: Emergency Medicine | Admitting: Emergency Medicine

## 2015-07-28 ENCOUNTER — Emergency Department (HOSPITAL_COMMUNITY): Payer: Medicare Other

## 2015-07-28 DIAGNOSIS — F419 Anxiety disorder, unspecified: Secondary | ICD-10-CM | POA: Insufficient documentation

## 2015-07-28 DIAGNOSIS — I10 Essential (primary) hypertension: Secondary | ICD-10-CM | POA: Diagnosis not present

## 2015-07-28 DIAGNOSIS — Z8669 Personal history of other diseases of the nervous system and sense organs: Secondary | ICD-10-CM | POA: Diagnosis not present

## 2015-07-28 DIAGNOSIS — J159 Unspecified bacterial pneumonia: Secondary | ICD-10-CM | POA: Diagnosis not present

## 2015-07-28 DIAGNOSIS — M1990 Unspecified osteoarthritis, unspecified site: Secondary | ICD-10-CM | POA: Insufficient documentation

## 2015-07-28 DIAGNOSIS — Z7982 Long term (current) use of aspirin: Secondary | ICD-10-CM | POA: Insufficient documentation

## 2015-07-28 DIAGNOSIS — Z79899 Other long term (current) drug therapy: Secondary | ICD-10-CM | POA: Insufficient documentation

## 2015-07-28 DIAGNOSIS — J189 Pneumonia, unspecified organism: Secondary | ICD-10-CM

## 2015-07-28 DIAGNOSIS — R0602 Shortness of breath: Secondary | ICD-10-CM

## 2015-07-28 DIAGNOSIS — Z8673 Personal history of transient ischemic attack (TIA), and cerebral infarction without residual deficits: Secondary | ICD-10-CM | POA: Diagnosis not present

## 2015-07-28 DIAGNOSIS — E119 Type 2 diabetes mellitus without complications: Secondary | ICD-10-CM | POA: Diagnosis not present

## 2015-07-28 DIAGNOSIS — Z794 Long term (current) use of insulin: Secondary | ICD-10-CM | POA: Insufficient documentation

## 2015-07-28 LAB — CBC WITH DIFFERENTIAL/PLATELET
BASOS ABS: 0.1 10*3/uL (ref 0.0–0.1)
BASOS PCT: 1 % (ref 0–1)
EOS PCT: 3 % (ref 0–5)
Eosinophils Absolute: 0.3 10*3/uL (ref 0.0–0.7)
HCT: 41.5 % (ref 36.0–46.0)
Hemoglobin: 14 g/dL (ref 12.0–15.0)
Lymphocytes Relative: 31 % (ref 12–46)
Lymphs Abs: 3.2 10*3/uL (ref 0.7–4.0)
MCH: 30.6 pg (ref 26.0–34.0)
MCHC: 33.7 g/dL (ref 30.0–36.0)
MCV: 90.6 fL (ref 78.0–100.0)
MONO ABS: 0.8 10*3/uL (ref 0.1–1.0)
Monocytes Relative: 7 % (ref 3–12)
Neutro Abs: 6 10*3/uL (ref 1.7–7.7)
Neutrophils Relative %: 58 % (ref 43–77)
PLATELETS: 237 10*3/uL (ref 150–400)
RBC: 4.58 MIL/uL (ref 3.87–5.11)
RDW: 13.2 % (ref 11.5–15.5)
WBC: 10.4 10*3/uL (ref 4.0–10.5)

## 2015-07-28 LAB — COMPREHENSIVE METABOLIC PANEL
ALBUMIN: 3.6 g/dL (ref 3.5–5.0)
ALT: 25 U/L (ref 14–54)
ANION GAP: 9 (ref 5–15)
AST: 28 U/L (ref 15–41)
Alkaline Phosphatase: 81 U/L (ref 38–126)
BUN: 13 mg/dL (ref 6–20)
CHLORIDE: 98 mmol/L — AB (ref 101–111)
CO2: 26 mmol/L (ref 22–32)
Calcium: 8.8 mg/dL — ABNORMAL LOW (ref 8.9–10.3)
Creatinine, Ser: 0.89 mg/dL (ref 0.44–1.00)
GFR calc Af Amer: >60 mL/min (ref >60)
Glucose, Bld: 389 mg/dL — ABNORMAL HIGH (ref 65–99)
POTASSIUM: 3.9 mmol/L (ref 3.5–5.1)
Sodium: 133 mmol/L — ABNORMAL LOW (ref 135–145)
Total Bilirubin: 0.5 mg/dL (ref 0.3–1.2)
Total Protein: 7.9 g/dL (ref 6.5–8.1)

## 2015-07-28 LAB — URINALYSIS, ROUTINE W REFLEX MICROSCOPIC
BILIRUBIN URINE: NEGATIVE
Glucose, UA: 100 mg/dL — AB
Hgb urine dipstick: NEGATIVE
KETONES UR: NEGATIVE mg/dL
Leukocytes, UA: NEGATIVE
NITRITE: NEGATIVE
PROTEIN: NEGATIVE mg/dL
SPECIFIC GRAVITY, URINE: 1.01 (ref 1.005–1.030)
UROBILINOGEN UA: 0.2 mg/dL (ref 0.0–1.0)
pH: 6.5 (ref 5.0–8.0)

## 2015-07-28 LAB — CBG MONITORING, ED: GLUCOSE-CAPILLARY: 357 mg/dL — AB (ref 65–99)

## 2015-07-28 LAB — BRAIN NATRIURETIC PEPTIDE: B NATRIURETIC PEPTIDE 5: 48 pg/mL (ref 0.0–100.0)

## 2015-07-28 LAB — TROPONIN I: Troponin I: <0.03 ng/mL (ref <0.031)

## 2015-07-28 MED ORDER — LEVOFLOXACIN 750 MG PO TABS
750.0000 mg | ORAL_TABLET | Freq: Once | ORAL | Status: AC
  Administered 2015-07-28: 750 mg via ORAL
  Filled 2015-07-28: qty 1

## 2015-07-28 MED ORDER — LEVOFLOXACIN 750 MG PO TABS: 750.0000 mg | ORAL_TABLET | Freq: Every day | ORAL | Status: DC

## 2015-07-28 MED ORDER — INSULIN ASPART 100 UNIT/ML ~~LOC~~ SOLN
15.0000 [IU] | Freq: Once | SUBCUTANEOUS | Status: AC
  Administered 2015-07-28: 15 [IU] via SUBCUTANEOUS
  Filled 2015-07-28: qty 1

## 2015-07-28 MED ORDER — IOHEXOL 350 MG/ML SOLN
100.0000 mL | Freq: Once | INTRAVENOUS | Status: AC | PRN
  Administered 2015-07-28: 100 mL via INTRAVENOUS

## 2015-07-28 MED ORDER — LORAZEPAM 0.5 MG PO TABS
0.5000 mg | ORAL_TABLET | Freq: Once | ORAL | Status: AC
  Administered 2015-07-28: 0.5 mg via ORAL
  Filled 2015-07-28: qty 1

## 2015-07-28 MED ORDER — ALBUTEROL SULFATE HFA 108 (90 BASE) MCG/ACT IN AERS: 2.0000 | INHALATION_SPRAY | RESPIRATORY_TRACT | Status: AC | PRN

## 2015-07-28 MED ORDER — HYDROXYZINE HCL 25 MG PO TABS
25.0000 mg | ORAL_TABLET | Freq: Three times a day (TID) | ORAL | Status: AC | PRN
Start: 2015-07-28 — End: ?

## 2015-07-28 NOTE — ED Notes (Signed)
Pt states she has been sob and very anxious feeling all day; pt states she feels clamy

## 2015-07-28 NOTE — ED Notes (Signed)
Pt's CBG 357

## 2015-07-28 NOTE — ED Provider Notes (Signed)
CSN: [id number]     Arrival date & time 07/28/15  1948 History   First MD Initiated Contact with Patient 07/28/15 2018     Chief Complaint  Patient presents with  . Shortness of Breath     (Consider location/radiation/quality/duration/timing/severity/associated sxs/prior Treatment) HPI Comments: Patient presents to the ER for evaluation of difficulty breathing. Patient reports that she was slightly short of breath yesterday evening, but symptoms worsened last night. She reports that she could not lie down because of shortness of breath, had to sit up in a chair all night. Today the symptoms have continued. She feels very anxious, has occasional fluttering in her chest. There has not been any chest pain.  Patient is a 69 y.o. female presenting with shortness of breath.  Shortness of Breath   Past Medical History  Diagnosis Date  . Hypertension   . Stroke     behind rt eye / visual defficiet rt eye  . Spinal stenosis of lumbar region   . Arthritis   . Difficulty sleeping     due to back pain  . Diabetes mellitus without complication     fasting 120-130s  . History of blood transfusion   . H/O Bell's palsy left   Past Surgical History  Procedure Laterality Date  . Cholecystectomy    . Abdominal hysterectomy    . Oophorectomy    . Bladder surgery      bladder tack  . Cervical spine surgery      cervical fusion  . Lumbar laminectomy/decompression microdiscectomy N/A 01/31/2013    Procedure: MICRO LUMBAR DECOMPRESSION L4-5;  Surgeon: Javier Docker, MD;  Location: [facility];  Service: Orthopedics;  Laterality: N/A;  Bilateral Decompression L4-L5  . Cholecystectomy open N/A   . Vaginal hysterectomy     Family History  Problem Relation Age of Onset  . Kidney failure Sister    Social History  Substance Use Topics  . Smoking status: Never Smoker   . Smokeless tobacco: None  . Alcohol Use: No   OB History    No data available     Review of Systems  Respiratory: Positive  for shortness of breath.   Cardiovascular: Positive for palpitations.  Psychiatric/Behavioral: The patient is nervous/anxious.   All other systems reviewed and are negative.     Allergies  Shellfish allergy  Home Medications   Prior to Admission medications   Medication Sig Start Date End Date Taking? Authorizing Provider  acetaminophen (TYLENOL) 500 MG tablet Take 1,000 mg by mouth every 6 (six) hours as needed for moderate pain or headache.   Yes Historical Provider, MD  aspirin 325 MG tablet Take 325 mg by mouth daily.   Yes Historical Provider, MD  diphenhydrAMINE (BENADRYL) 25 MG tablet Take 50 mg by mouth at bedtime as needed for sleep.    Yes Historical Provider, MD  DULoxetine (CYMBALTA) 60 MG capsule Take 60 mg by mouth daily.   Yes Historical Provider, MD  fentaNYL (DURAGESIC - DOSED MCG/HR) 75 MCG/HR Place 75 mcg onto the skin every 3 (three) days.   Yes Historical Provider, MD  gabapentin (NEURONTIN) 300 MG capsule Take 600 mg by mouth at bedtime. 07/09/15  Yes Historical Provider, MD  glipiZIDE (GLUCOTROL) 10 MG tablet Take 10 mg by mouth 2 (two) times daily. 07/14/15  Yes Historical Provider, MD  hydrochlorothiazide (HYDRODIURIL) 25 MG tablet Take 25 mg by mouth daily as needed (for fluid).   Yes Historical Provider, MD  insulin NPH Human (HUMULIN N,NOVOLIN  N) 100 UNIT/ML injection Inject 60 Units into the skin 2 (two) times daily. *May take a third dose of 60 units for high blood sugars*   Yes Historical Provider, MD  lisinopril (PRINIVIL,ZESTRIL) 40 MG tablet Take 40 mg by mouth daily.   Yes Historical Provider, MD  NOVOLOG 100 UNIT/ML injection Inject 1-10 Units into the skin 3 (three) times daily as needed. For blood sugar 07/21/15  Yes Historical Provider, MD  albuterol (PROVENTIL HFA;VENTOLIN HFA) 108 (90 BASE) MCG/ACT inhaler Inhale 2 puffs into the lungs every 4 (four) hours as needed for wheezing or shortness of breath. 07/28/15   Gilda Crease, MD  hydrOXYzine  (ATARAX/VISTARIL) 25 MG tablet Take 1 tablet (25 mg total) by mouth every 8 (eight) hours as needed for anxiety. 07/28/15   Gilda Crease, MD  levofloxacin (LEVAQUIN) 750 MG tablet Take 1 tablet (750 mg total) by mouth daily. 07/28/15   Gilda Crease, MD   BP 157/66 mmHg  Pulse 88  Temp(Src) 97.9 F (36.6 C) (Oral)  Resp 19  Ht 5' (1.524 m)  Wt 240 lb (108.863 kg)  BMI 46.87 kg/m2  SpO2 99% Physical Exam  Constitutional: She is oriented to person, place, and time. She appears well-developed and well-nourished. No distress.  HENT:  Head: Normocephalic and atraumatic.  Right Ear: Hearing normal.  Left Ear: Hearing normal.  Nose: Nose normal.  Mouth/Throat: Oropharynx is clear and moist and mucous membranes are normal.  Eyes: Conjunctivae and EOM are normal. Pupils are equal, round, and reactive to light.  Neck: Normal range of motion. Neck supple.  Cardiovascular: Regular rhythm, S1 normal and S2 normal.   Occasional extrasystoles are present. Exam reveals no gallop and no friction rub.   No murmur heard. Pulmonary/Chest: Effort normal and breath sounds normal. No respiratory distress. She exhibits no tenderness.  Abdominal: Soft. Normal appearance and bowel sounds are normal. There is no hepatosplenomegaly. There is no tenderness. There is no rebound, no guarding, no tenderness at McBurney's point and negative Murphy's sign. No hernia.  Musculoskeletal: Normal range of motion.  Neurological: She is alert and oriented to person, place, and time. She has normal strength. No cranial nerve deficit or sensory deficit. Coordination normal. GCS eye subscore is 4. GCS verbal subscore is 5. GCS motor subscore is 6.  Skin: Skin is warm, dry and intact. No rash noted. No cyanosis.  Psychiatric: She has a normal mood and affect. Her speech is normal and behavior is normal. Thought content normal.  Nursing note and vitals reviewed.   ED Course  Procedures (including critical care  time) Labs Review Labs Reviewed  COMPREHENSIVE METABOLIC PANEL - Abnormal; Notable for the following:    Sodium 133 (*)    Chloride 98 (*)    Glucose, Bld 389 (*)    Calcium 8.8 (*)    All other components within normal limits  URINALYSIS, ROUTINE W REFLEX MICROSCOPIC (NOT AT [facility]) - Abnormal; Notable for the following:    Color, Urine STRAW (*)    Glucose, UA 100 (*)    All other components within normal limits  CBG MONITORING, ED - Abnormal; Notable for the following:    Glucose-Capillary 357 (*)    All other components within normal limits  CBC WITH DIFFERENTIAL/PLATELET  BRAIN NATRIURETIC PEPTIDE  TROPONIN I    Imaging Review Dg Chest 2 View  07/28/2015   CLINICAL DATA:  Shortness of breath, anxiety  EXAM: CHEST  2 VIEW  COMPARISON:  08/20/2014  FINDINGS: The heart size and mediastinal contours are within normal limits. Both lungs are clear. The visualized skeletal structures are unremarkable. Lungs are hypoaerated with crowding of the bronchovascular markings. Cervical fusion hardware noted.  IMPRESSION: No active cardiopulmonary disease.   Electronically Signed   By: Christiana Pellant M.D.   On: 07/28/2015 21:00   Ct Angio Chest Pe W/cm &/or Wo Cm  07/28/2015   CLINICAL DATA:  Acute onset of shortness of breath and clamminess. Initial encounter.  EXAM: CT ANGIOGRAPHY CHEST WITH CONTRAST  TECHNIQUE: Multidetector CT imaging of the chest was performed using the standard protocol during bolus administration of intravenous contrast. Multiplanar CT image reconstructions and MIPs were obtained to evaluate the vascular anatomy.  CONTRAST:  OMNIPAQUE IOHEXOL 350 MG/ML SOLN  COMPARISON:  Chest radiograph performed earlier today at 8:30 p.m.  FINDINGS: There is no evidence of pulmonary embolus.  Minimal hazy bilateral central opacities may reflect atelectasis or possibly mild pneumonia. There is no evidence of pleural effusion or pneumothorax. No masses are identified; no abnormal focal  contrast enhancement is seen.  Scattered coronary artery calcifications are seen. The mediastinum is otherwise unremarkable. No mediastinal lymphadenopathy is seen. No pericardial effusions identified. Scattered calcification is noted along the descending thoracic aorta. The great vessels are grossly unremarkable in appearance. No axillary lymphadenopathy is seen. A calcification is noted at the left thyroid lobe. No dominant thyroid mass is seen.  The visualized portions of the liver and spleen are unremarkable.  No acute osseous abnormalities are seen.  Review of the MIP images confirms the above findings.  IMPRESSION: 1. No evidence of pulmonary embolus. 2. Minimal hazy bilateral central airspace opacities may reflect atelectasis or possibly mild pneumonia. 3. Scattered coronary artery calcifications seen.   Electronically Signed   By: Roanna Raider M.D.   On: 07/28/2015 22:50   I have personally reviewed and evaluated these images and lab results as part of my medical decision-making.   EKG Interpretation   Date/Time:  Monday July 28 2015 20:01:16 EDT Ventricular Rate:  90 PR Interval:  146 QRS Duration: 86 QT Interval:  358 QTC Calculation: 437 R Axis:   -8 Text Interpretation:  Sinus rhythm with frequent Premature ventricular  complexes Cannot rule out Anterior infarct , age undetermined Abnormal ECG  No significant change since last Confirmed by POLLINA  MD, CHRISTOPHER  [phone]) on 07/28/2015 8:04:16 PM      MDM   Final diagnoses:  Shortness of breath  CAP (community acquired pneumonia)  Anxiety    Patient presents to the ER for evaluation of shortness of breath. Patient reports that she has been feeling very short of breath today. She tried to use her husband's inhaler. It helped her breathe better, but it made her more anxious. She has been feeling severely anxious through the course of the evening. She is not expressing any chest pain.  EKG was unremarkable. Troponin  negative. No evidence of congestive heart failure. All lab work was within normal limits other than mildly elevated glucose. She was administered insulin for this. CT angiography performed to further evaluate for possible PE. No PE noted, but there is evidence of possible early pneumonia. Will treat with Levaquin, albuterol. She was prescribed Atarax for anxiety. Follow-up with PCP.   Gilda Crease, MD 07/28/15 2300

## 2015-07-28 NOTE — ED Notes (Signed)
Discharge instructions given, pt demonstrated teach back and verbal understanding. No concerns voiced.  

## 2015-07-28 NOTE — Discharge Instructions (Signed)

## 2015-12-11 DIAGNOSIS — Z9889 Other specified postprocedural states: Secondary | ICD-10-CM | POA: Diagnosis not present

## 2015-12-11 DIAGNOSIS — M5136 Other intervertebral disc degeneration, lumbar region: Secondary | ICD-10-CM | POA: Diagnosis not present

## 2015-12-11 DIAGNOSIS — M4316 Spondylolisthesis, lumbar region: Secondary | ICD-10-CM | POA: Diagnosis not present

## 2015-12-11 DIAGNOSIS — M47816 Spondylosis without myelopathy or radiculopathy, lumbar region: Secondary | ICD-10-CM | POA: Diagnosis not present

## 2015-12-15 DIAGNOSIS — M47816 Spondylosis without myelopathy or radiculopathy, lumbar region: Secondary | ICD-10-CM | POA: Diagnosis not present

## 2015-12-15 DIAGNOSIS — M1990 Unspecified osteoarthritis, unspecified site: Secondary | ICD-10-CM | POA: Diagnosis not present

## 2015-12-15 DIAGNOSIS — Z6841 Body Mass Index (BMI) 40.0 and over, adult: Secondary | ICD-10-CM | POA: Diagnosis not present

## 2015-12-15 DIAGNOSIS — I10 Essential (primary) hypertension: Secondary | ICD-10-CM | POA: Diagnosis not present

## 2015-12-15 DIAGNOSIS — M4326 Fusion of spine, lumbar region: Secondary | ICD-10-CM | POA: Diagnosis not present

## 2015-12-15 DIAGNOSIS — E119 Type 2 diabetes mellitus without complications: Secondary | ICD-10-CM | POA: Diagnosis not present

## 2015-12-15 DIAGNOSIS — M4726 Other spondylosis with radiculopathy, lumbar region: Secondary | ICD-10-CM | POA: Diagnosis not present

## 2015-12-15 DIAGNOSIS — M797 Fibromyalgia: Secondary | ICD-10-CM | POA: Diagnosis not present

## 2015-12-15 DIAGNOSIS — Z79899 Other long term (current) drug therapy: Secondary | ICD-10-CM | POA: Diagnosis not present

## 2015-12-15 DIAGNOSIS — Z794 Long term (current) use of insulin: Secondary | ICD-10-CM | POA: Diagnosis not present

## 2015-12-15 DIAGNOSIS — T85628A Displacement of other specified internal prosthetic devices, implants and grafts, initial encounter: Secondary | ICD-10-CM | POA: Diagnosis not present

## 2015-12-15 DIAGNOSIS — T84428A Displacement of other internal orthopedic devices, implants and grafts, initial encounter: Secondary | ICD-10-CM | POA: Diagnosis not present

## 2015-12-15 DIAGNOSIS — Z79891 Long term (current) use of opiate analgesic: Secondary | ICD-10-CM | POA: Diagnosis not present

## 2015-12-15 DIAGNOSIS — M4316 Spondylolisthesis, lumbar region: Secondary | ICD-10-CM | POA: Diagnosis not present

## 2015-12-15 DIAGNOSIS — Z90710 Acquired absence of both cervix and uterus: Secondary | ICD-10-CM | POA: Diagnosis not present

## 2015-12-15 DIAGNOSIS — Z7982 Long term (current) use of aspirin: Secondary | ICD-10-CM | POA: Diagnosis not present

## 2015-12-15 DIAGNOSIS — Z8673 Personal history of transient ischemic attack (TIA), and cerebral infarction without residual deficits: Secondary | ICD-10-CM | POA: Diagnosis not present

## 2015-12-15 DIAGNOSIS — E669 Obesity, unspecified: Secondary | ICD-10-CM | POA: Diagnosis not present

## 2015-12-16 DIAGNOSIS — M4726 Other spondylosis with radiculopathy, lumbar region: Secondary | ICD-10-CM | POA: Diagnosis not present

## 2015-12-22 DIAGNOSIS — M5136 Other intervertebral disc degeneration, lumbar region: Secondary | ICD-10-CM | POA: Diagnosis not present

## 2015-12-22 DIAGNOSIS — M47816 Spondylosis without myelopathy or radiculopathy, lumbar region: Secondary | ICD-10-CM | POA: Diagnosis not present

## 2015-12-22 DIAGNOSIS — Z981 Arthrodesis status: Secondary | ICD-10-CM | POA: Diagnosis not present

## 2016-01-28 DIAGNOSIS — M47816 Spondylosis without myelopathy or radiculopathy, lumbar region: Secondary | ICD-10-CM | POA: Diagnosis not present

## 2016-01-28 DIAGNOSIS — M4326 Fusion of spine, lumbar region: Secondary | ICD-10-CM | POA: Diagnosis not present

## 2016-01-28 DIAGNOSIS — Z981 Arthrodesis status: Secondary | ICD-10-CM | POA: Diagnosis not present

## 2016-02-02 DIAGNOSIS — M47816 Spondylosis without myelopathy or radiculopathy, lumbar region: Secondary | ICD-10-CM | POA: Diagnosis not present

## 2016-02-03 DIAGNOSIS — Z79891 Long term (current) use of opiate analgesic: Secondary | ICD-10-CM | POA: Diagnosis not present

## 2016-02-03 DIAGNOSIS — E875 Hyperkalemia: Secondary | ICD-10-CM | POA: Diagnosis not present

## 2016-02-03 DIAGNOSIS — M797 Fibromyalgia: Secondary | ICD-10-CM | POA: Diagnosis not present

## 2016-02-03 DIAGNOSIS — I10 Essential (primary) hypertension: Secondary | ICD-10-CM | POA: Diagnosis not present

## 2016-02-03 DIAGNOSIS — Z6841 Body Mass Index (BMI) 40.0 and over, adult: Secondary | ICD-10-CM | POA: Diagnosis not present

## 2016-02-03 DIAGNOSIS — M4326 Fusion of spine, lumbar region: Secondary | ICD-10-CM | POA: Diagnosis not present

## 2016-02-03 DIAGNOSIS — E119 Type 2 diabetes mellitus without complications: Secondary | ICD-10-CM | POA: Diagnosis not present

## 2016-02-03 DIAGNOSIS — M1990 Unspecified osteoarthritis, unspecified site: Secondary | ICD-10-CM | POA: Diagnosis not present

## 2016-02-03 DIAGNOSIS — F329 Major depressive disorder, single episode, unspecified: Secondary | ICD-10-CM | POA: Diagnosis not present

## 2016-02-03 DIAGNOSIS — T84428A Displacement of other internal orthopedic devices, implants and grafts, initial encounter: Secondary | ICD-10-CM | POA: Diagnosis not present

## 2016-02-03 DIAGNOSIS — G629 Polyneuropathy, unspecified: Secondary | ICD-10-CM | POA: Diagnosis not present

## 2016-02-03 DIAGNOSIS — Z79899 Other long term (current) drug therapy: Secondary | ICD-10-CM | POA: Diagnosis not present

## 2016-02-03 DIAGNOSIS — T85628A Displacement of other specified internal prosthetic devices, implants and grafts, initial encounter: Secondary | ICD-10-CM | POA: Diagnosis not present

## 2016-02-03 DIAGNOSIS — M4316 Spondylolisthesis, lumbar region: Secondary | ICD-10-CM | POA: Diagnosis not present

## 2016-02-03 DIAGNOSIS — Z888 Allergy status to other drugs, medicaments and biological substances status: Secondary | ICD-10-CM | POA: Diagnosis not present

## 2016-02-03 DIAGNOSIS — Z8673 Personal history of transient ischemic attack (TIA), and cerebral infarction without residual deficits: Secondary | ICD-10-CM | POA: Diagnosis not present

## 2016-02-03 DIAGNOSIS — Z794 Long term (current) use of insulin: Secondary | ICD-10-CM | POA: Diagnosis not present

## 2016-02-03 DIAGNOSIS — E669 Obesity, unspecified: Secondary | ICD-10-CM | POA: Diagnosis not present

## 2016-02-03 DIAGNOSIS — M47816 Spondylosis without myelopathy or radiculopathy, lumbar region: Secondary | ICD-10-CM | POA: Diagnosis not present

## 2016-02-03 DIAGNOSIS — Z7982 Long term (current) use of aspirin: Secondary | ICD-10-CM | POA: Diagnosis not present

## 2016-02-03 DIAGNOSIS — M47896 Other spondylosis, lumbar region: Secondary | ICD-10-CM | POA: Diagnosis not present

## 2016-02-04 DIAGNOSIS — M47816 Spondylosis without myelopathy or radiculopathy, lumbar region: Secondary | ICD-10-CM | POA: Diagnosis not present

## 2016-02-05 DIAGNOSIS — M47816 Spondylosis without myelopathy or radiculopathy, lumbar region: Secondary | ICD-10-CM | POA: Diagnosis not present

## 2016-02-07 DIAGNOSIS — M47816 Spondylosis without myelopathy or radiculopathy, lumbar region: Secondary | ICD-10-CM | POA: Diagnosis not present

## 2016-02-08 DIAGNOSIS — M47816 Spondylosis without myelopathy or radiculopathy, lumbar region: Secondary | ICD-10-CM | POA: Diagnosis not present

## 2016-02-11 DIAGNOSIS — Z981 Arthrodesis status: Secondary | ICD-10-CM | POA: Diagnosis not present

## 2016-02-11 DIAGNOSIS — M4326 Fusion of spine, lumbar region: Secondary | ICD-10-CM | POA: Diagnosis not present

## 2016-02-11 DIAGNOSIS — M47816 Spondylosis without myelopathy or radiculopathy, lumbar region: Secondary | ICD-10-CM | POA: Diagnosis not present

## 2016-03-08 DIAGNOSIS — E1140 Type 2 diabetes mellitus with diabetic neuropathy, unspecified: Secondary | ICD-10-CM | POA: Diagnosis not present

## 2016-03-08 DIAGNOSIS — Z8673 Personal history of transient ischemic attack (TIA), and cerebral infarction without residual deficits: Secondary | ICD-10-CM | POA: Diagnosis not present

## 2016-03-08 DIAGNOSIS — Z794 Long term (current) use of insulin: Secondary | ICD-10-CM | POA: Diagnosis not present

## 2016-03-08 DIAGNOSIS — M47816 Spondylosis without myelopathy or radiculopathy, lumbar region: Secondary | ICD-10-CM | POA: Diagnosis not present

## 2016-03-08 DIAGNOSIS — I10 Essential (primary) hypertension: Secondary | ICD-10-CM | POA: Diagnosis not present

## 2016-03-08 DIAGNOSIS — F329 Major depressive disorder, single episode, unspecified: Secondary | ICD-10-CM | POA: Diagnosis not present

## 2016-03-08 DIAGNOSIS — M6281 Muscle weakness (generalized): Secondary | ICD-10-CM | POA: Diagnosis not present

## 2016-03-08 DIAGNOSIS — Z7984 Long term (current) use of oral hypoglycemic drugs: Secondary | ICD-10-CM | POA: Diagnosis not present

## 2016-03-08 DIAGNOSIS — M4316 Spondylolisthesis, lumbar region: Secondary | ICD-10-CM | POA: Diagnosis not present

## 2016-03-29 DIAGNOSIS — I10 Essential (primary) hypertension: Secondary | ICD-10-CM | POA: Diagnosis not present

## 2016-03-29 DIAGNOSIS — M6281 Muscle weakness (generalized): Secondary | ICD-10-CM | POA: Diagnosis not present

## 2016-03-29 DIAGNOSIS — E1140 Type 2 diabetes mellitus with diabetic neuropathy, unspecified: Secondary | ICD-10-CM | POA: Diagnosis not present

## 2016-03-29 DIAGNOSIS — Z8673 Personal history of transient ischemic attack (TIA), and cerebral infarction without residual deficits: Secondary | ICD-10-CM | POA: Diagnosis not present

## 2016-03-29 DIAGNOSIS — M47816 Spondylosis without myelopathy or radiculopathy, lumbar region: Secondary | ICD-10-CM | POA: Diagnosis not present

## 2016-03-29 DIAGNOSIS — M4316 Spondylolisthesis, lumbar region: Secondary | ICD-10-CM | POA: Diagnosis not present

## 2016-03-29 DIAGNOSIS — Z7984 Long term (current) use of oral hypoglycemic drugs: Secondary | ICD-10-CM | POA: Diagnosis not present

## 2016-03-29 DIAGNOSIS — F329 Major depressive disorder, single episode, unspecified: Secondary | ICD-10-CM | POA: Diagnosis not present

## 2016-03-29 DIAGNOSIS — Z794 Long term (current) use of insulin: Secondary | ICD-10-CM | POA: Diagnosis not present

## 2016-04-08 DIAGNOSIS — I10 Essential (primary) hypertension: Secondary | ICD-10-CM | POA: Diagnosis not present

## 2016-04-08 DIAGNOSIS — E1165 Type 2 diabetes mellitus with hyperglycemia: Secondary | ICD-10-CM | POA: Diagnosis not present

## 2016-04-08 DIAGNOSIS — E1140 Type 2 diabetes mellitus with diabetic neuropathy, unspecified: Secondary | ICD-10-CM | POA: Diagnosis not present

## 2016-04-18 DIAGNOSIS — F419 Anxiety disorder, unspecified: Secondary | ICD-10-CM | POA: Diagnosis not present

## 2016-04-18 DIAGNOSIS — M25551 Pain in right hip: Secondary | ICD-10-CM | POA: Diagnosis not present

## 2016-04-18 DIAGNOSIS — S299XXA Unspecified injury of thorax, initial encounter: Secondary | ICD-10-CM | POA: Diagnosis not present

## 2016-04-18 DIAGNOSIS — Z7982 Long term (current) use of aspirin: Secondary | ICD-10-CM | POA: Diagnosis not present

## 2016-04-18 DIAGNOSIS — F329 Major depressive disorder, single episode, unspecified: Secondary | ICD-10-CM | POA: Diagnosis not present

## 2016-04-18 DIAGNOSIS — Z79899 Other long term (current) drug therapy: Secondary | ICD-10-CM | POA: Diagnosis not present

## 2016-04-18 DIAGNOSIS — E119 Type 2 diabetes mellitus without complications: Secondary | ICD-10-CM | POA: Diagnosis not present

## 2016-04-18 DIAGNOSIS — M25562 Pain in left knee: Secondary | ICD-10-CM | POA: Diagnosis not present

## 2016-04-18 DIAGNOSIS — W1789XA Other fall from one level to another, initial encounter: Secondary | ICD-10-CM | POA: Diagnosis not present

## 2016-04-18 DIAGNOSIS — R262 Difficulty in walking, not elsewhere classified: Secondary | ICD-10-CM | POA: Diagnosis not present

## 2016-04-18 DIAGNOSIS — W1839XA Other fall on same level, initial encounter: Secondary | ICD-10-CM | POA: Diagnosis not present

## 2016-04-18 DIAGNOSIS — S79911A Unspecified injury of right hip, initial encounter: Secondary | ICD-10-CM | POA: Diagnosis not present

## 2016-04-18 DIAGNOSIS — I10 Essential (primary) hypertension: Secondary | ICD-10-CM | POA: Diagnosis not present

## 2016-04-18 DIAGNOSIS — Y92009 Unspecified place in unspecified non-institutional (private) residence as the place of occurrence of the external cause: Secondary | ICD-10-CM | POA: Diagnosis not present

## 2016-04-18 DIAGNOSIS — M25561 Pain in right knee: Secondary | ICD-10-CM | POA: Diagnosis not present

## 2016-04-18 DIAGNOSIS — Z7984 Long term (current) use of oral hypoglycemic drugs: Secondary | ICD-10-CM | POA: Diagnosis not present

## 2016-04-18 DIAGNOSIS — M1990 Unspecified osteoarthritis, unspecified site: Secondary | ICD-10-CM | POA: Diagnosis not present

## 2016-04-18 DIAGNOSIS — T148 Other injury of unspecified body region: Secondary | ICD-10-CM | POA: Diagnosis not present

## 2016-04-18 DIAGNOSIS — M797 Fibromyalgia: Secondary | ICD-10-CM | POA: Diagnosis not present

## 2016-04-18 DIAGNOSIS — Z794 Long term (current) use of insulin: Secondary | ICD-10-CM | POA: Diagnosis not present

## 2016-04-18 DIAGNOSIS — Z8673 Personal history of transient ischemic attack (TIA), and cerebral infarction without residual deficits: Secondary | ICD-10-CM | POA: Diagnosis not present

## 2016-04-27 DIAGNOSIS — I10 Essential (primary) hypertension: Secondary | ICD-10-CM | POA: Diagnosis not present

## 2016-04-27 DIAGNOSIS — M47816 Spondylosis without myelopathy or radiculopathy, lumbar region: Secondary | ICD-10-CM | POA: Diagnosis not present

## 2016-04-27 DIAGNOSIS — E1140 Type 2 diabetes mellitus with diabetic neuropathy, unspecified: Secondary | ICD-10-CM | POA: Diagnosis not present

## 2016-04-27 DIAGNOSIS — Z794 Long term (current) use of insulin: Secondary | ICD-10-CM | POA: Diagnosis not present

## 2016-04-27 DIAGNOSIS — Z8673 Personal history of transient ischemic attack (TIA), and cerebral infarction without residual deficits: Secondary | ICD-10-CM | POA: Diagnosis not present

## 2016-04-27 DIAGNOSIS — F329 Major depressive disorder, single episode, unspecified: Secondary | ICD-10-CM | POA: Diagnosis not present

## 2016-04-27 DIAGNOSIS — Z7984 Long term (current) use of oral hypoglycemic drugs: Secondary | ICD-10-CM | POA: Diagnosis not present

## 2016-04-27 DIAGNOSIS — M4316 Spondylolisthesis, lumbar region: Secondary | ICD-10-CM | POA: Diagnosis not present

## 2016-04-27 DIAGNOSIS — M6281 Muscle weakness (generalized): Secondary | ICD-10-CM | POA: Diagnosis not present

## 2016-04-28 DIAGNOSIS — M25561 Pain in right knee: Secondary | ICD-10-CM | POA: Diagnosis not present

## 2016-04-28 DIAGNOSIS — T148 Other injury of unspecified body region: Secondary | ICD-10-CM | POA: Diagnosis not present

## 2016-04-28 DIAGNOSIS — Z9181 History of falling: Secondary | ICD-10-CM | POA: Diagnosis not present

## 2016-05-05 DIAGNOSIS — M47816 Spondylosis without myelopathy or radiculopathy, lumbar region: Secondary | ICD-10-CM | POA: Diagnosis not present

## 2016-05-05 DIAGNOSIS — F329 Major depressive disorder, single episode, unspecified: Secondary | ICD-10-CM | POA: Diagnosis not present

## 2016-05-05 DIAGNOSIS — Z794 Long term (current) use of insulin: Secondary | ICD-10-CM | POA: Diagnosis not present

## 2016-05-05 DIAGNOSIS — Z8673 Personal history of transient ischemic attack (TIA), and cerebral infarction without residual deficits: Secondary | ICD-10-CM | POA: Diagnosis not present

## 2016-05-05 DIAGNOSIS — M4316 Spondylolisthesis, lumbar region: Secondary | ICD-10-CM | POA: Diagnosis not present

## 2016-05-05 DIAGNOSIS — Z7984 Long term (current) use of oral hypoglycemic drugs: Secondary | ICD-10-CM | POA: Diagnosis not present

## 2016-05-05 DIAGNOSIS — I10 Essential (primary) hypertension: Secondary | ICD-10-CM | POA: Diagnosis not present

## 2016-05-05 DIAGNOSIS — M6281 Muscle weakness (generalized): Secondary | ICD-10-CM | POA: Diagnosis not present

## 2016-05-05 DIAGNOSIS — E1140 Type 2 diabetes mellitus with diabetic neuropathy, unspecified: Secondary | ICD-10-CM | POA: Diagnosis not present

## 2016-05-11 DIAGNOSIS — M47816 Spondylosis without myelopathy or radiculopathy, lumbar region: Secondary | ICD-10-CM | POA: Diagnosis not present

## 2016-05-11 DIAGNOSIS — E1140 Type 2 diabetes mellitus with diabetic neuropathy, unspecified: Secondary | ICD-10-CM | POA: Diagnosis not present

## 2016-05-11 DIAGNOSIS — M4316 Spondylolisthesis, lumbar region: Secondary | ICD-10-CM | POA: Diagnosis not present

## 2016-05-17 DIAGNOSIS — F411 Generalized anxiety disorder: Secondary | ICD-10-CM | POA: Diagnosis not present

## 2016-05-17 DIAGNOSIS — R110 Nausea: Secondary | ICD-10-CM | POA: Diagnosis not present

## 2016-05-17 DIAGNOSIS — I10 Essential (primary) hypertension: Secondary | ICD-10-CM | POA: Diagnosis not present

## 2016-05-26 DIAGNOSIS — T148 Other injury of unspecified body region: Secondary | ICD-10-CM | POA: Diagnosis not present

## 2016-05-26 DIAGNOSIS — Z9181 History of falling: Secondary | ICD-10-CM | POA: Diagnosis not present

## 2016-05-26 DIAGNOSIS — M25561 Pain in right knee: Secondary | ICD-10-CM | POA: Diagnosis not present

## 2016-06-03 DIAGNOSIS — R300 Dysuria: Secondary | ICD-10-CM | POA: Diagnosis not present

## 2016-06-03 DIAGNOSIS — N3000 Acute cystitis without hematuria: Secondary | ICD-10-CM | POA: Diagnosis not present

## 2016-06-03 DIAGNOSIS — E6609 Other obesity due to excess calories: Secondary | ICD-10-CM | POA: Diagnosis not present

## 2016-06-03 DIAGNOSIS — Z6841 Body Mass Index (BMI) 40.0 and over, adult: Secondary | ICD-10-CM | POA: Diagnosis not present

## 2016-06-03 DIAGNOSIS — M791 Myalgia: Secondary | ICD-10-CM | POA: Diagnosis not present

## 2016-08-04 DIAGNOSIS — I10 Essential (primary) hypertension: Secondary | ICD-10-CM | POA: Diagnosis not present

## 2016-08-04 DIAGNOSIS — E1140 Type 2 diabetes mellitus with diabetic neuropathy, unspecified: Secondary | ICD-10-CM | POA: Diagnosis not present

## 2016-08-04 DIAGNOSIS — Z6841 Body Mass Index (BMI) 40.0 and over, adult: Secondary | ICD-10-CM | POA: Diagnosis not present

## 2016-08-04 DIAGNOSIS — E1165 Type 2 diabetes mellitus with hyperglycemia: Secondary | ICD-10-CM | POA: Diagnosis not present

## 2016-08-04 DIAGNOSIS — R609 Edema, unspecified: Secondary | ICD-10-CM | POA: Diagnosis not present

## 2016-08-25 DIAGNOSIS — H52223 Regular astigmatism, bilateral: Secondary | ICD-10-CM | POA: Diagnosis not present

## 2016-08-25 DIAGNOSIS — H524 Presbyopia: Secondary | ICD-10-CM | POA: Diagnosis not present

## 2016-08-25 DIAGNOSIS — H5203 Hypermetropia, bilateral: Secondary | ICD-10-CM | POA: Diagnosis not present

## 2016-08-26 DIAGNOSIS — E1140 Type 2 diabetes mellitus with diabetic neuropathy, unspecified: Secondary | ICD-10-CM | POA: Diagnosis not present

## 2016-08-26 DIAGNOSIS — I10 Essential (primary) hypertension: Secondary | ICD-10-CM | POA: Diagnosis not present

## 2016-08-26 DIAGNOSIS — Z6841 Body Mass Index (BMI) 40.0 and over, adult: Secondary | ICD-10-CM | POA: Diagnosis not present

## 2016-08-26 DIAGNOSIS — M797 Fibromyalgia: Secondary | ICD-10-CM | POA: Diagnosis not present

## 2016-08-26 DIAGNOSIS — Z23 Encounter for immunization: Secondary | ICD-10-CM | POA: Diagnosis not present

## 2016-08-26 DIAGNOSIS — F411 Generalized anxiety disorder: Secondary | ICD-10-CM | POA: Diagnosis not present

## 2016-08-27 DIAGNOSIS — E1140 Type 2 diabetes mellitus with diabetic neuropathy, unspecified: Secondary | ICD-10-CM | POA: Diagnosis not present

## 2016-08-27 DIAGNOSIS — M47816 Spondylosis without myelopathy or radiculopathy, lumbar region: Secondary | ICD-10-CM | POA: Diagnosis not present

## 2016-08-27 DIAGNOSIS — M4316 Spondylolisthesis, lumbar region: Secondary | ICD-10-CM | POA: Diagnosis not present

## 2016-08-30 DIAGNOSIS — H34812 Central retinal vein occlusion, left eye, with macular edema: Secondary | ICD-10-CM | POA: Diagnosis not present

## 2016-08-30 DIAGNOSIS — H43813 Vitreous degeneration, bilateral: Secondary | ICD-10-CM | POA: Diagnosis not present

## 2016-08-30 DIAGNOSIS — H348112 Central retinal vein occlusion, right eye, stable: Secondary | ICD-10-CM | POA: Diagnosis not present

## 2016-08-30 DIAGNOSIS — H35372 Puckering of macula, left eye: Secondary | ICD-10-CM | POA: Diagnosis not present

## 2016-09-03 DIAGNOSIS — H34812 Central retinal vein occlusion, left eye, with macular edema: Secondary | ICD-10-CM | POA: Diagnosis not present

## 2016-09-29 DIAGNOSIS — M797 Fibromyalgia: Secondary | ICD-10-CM | POA: Diagnosis not present

## 2016-09-29 DIAGNOSIS — R609 Edema, unspecified: Secondary | ICD-10-CM | POA: Diagnosis not present

## 2016-09-29 DIAGNOSIS — E1140 Type 2 diabetes mellitus with diabetic neuropathy, unspecified: Secondary | ICD-10-CM | POA: Diagnosis not present

## 2016-09-29 DIAGNOSIS — I10 Essential (primary) hypertension: Secondary | ICD-10-CM | POA: Diagnosis not present

## 2016-09-29 DIAGNOSIS — F411 Generalized anxiety disorder: Secondary | ICD-10-CM | POA: Diagnosis not present

## 2016-10-01 DIAGNOSIS — H348112 Central retinal vein occlusion, right eye, stable: Secondary | ICD-10-CM | POA: Diagnosis not present

## 2016-10-01 DIAGNOSIS — H35372 Puckering of macula, left eye: Secondary | ICD-10-CM | POA: Diagnosis not present

## 2016-10-01 DIAGNOSIS — H43813 Vitreous degeneration, bilateral: Secondary | ICD-10-CM | POA: Diagnosis not present

## 2016-10-01 DIAGNOSIS — H34812 Central retinal vein occlusion, left eye, with macular edema: Secondary | ICD-10-CM | POA: Diagnosis not present

## 2016-10-06 DIAGNOSIS — I10 Essential (primary) hypertension: Secondary | ICD-10-CM | POA: Diagnosis not present

## 2016-10-06 DIAGNOSIS — Z6841 Body Mass Index (BMI) 40.0 and over, adult: Secondary | ICD-10-CM | POA: Diagnosis not present

## 2016-10-06 DIAGNOSIS — E1165 Type 2 diabetes mellitus with hyperglycemia: Secondary | ICD-10-CM | POA: Diagnosis not present

## 2016-10-06 DIAGNOSIS — E1140 Type 2 diabetes mellitus with diabetic neuropathy, unspecified: Secondary | ICD-10-CM | POA: Diagnosis not present

## 2016-10-11 DIAGNOSIS — E1140 Type 2 diabetes mellitus with diabetic neuropathy, unspecified: Secondary | ICD-10-CM | POA: Diagnosis not present

## 2016-10-11 DIAGNOSIS — M4316 Spondylolisthesis, lumbar region: Secondary | ICD-10-CM | POA: Diagnosis not present

## 2016-10-11 DIAGNOSIS — M47816 Spondylosis without myelopathy or radiculopathy, lumbar region: Secondary | ICD-10-CM | POA: Diagnosis not present

## 2016-11-12 DIAGNOSIS — H43813 Vitreous degeneration, bilateral: Secondary | ICD-10-CM | POA: Diagnosis not present

## 2016-11-12 DIAGNOSIS — H34812 Central retinal vein occlusion, left eye, with macular edema: Secondary | ICD-10-CM | POA: Diagnosis not present

## 2016-11-12 DIAGNOSIS — H35372 Puckering of macula, left eye: Secondary | ICD-10-CM | POA: Diagnosis not present

## 2016-11-12 DIAGNOSIS — H348112 Central retinal vein occlusion, right eye, stable: Secondary | ICD-10-CM | POA: Diagnosis not present

## 2016-12-27 ENCOUNTER — Inpatient Hospital Stay (HOSPITAL_COMMUNITY)
Admission: AD | Admit: 2016-12-27 | Discharge: 2017-01-05 | DRG: 100 | Disposition: A | Discharge status: Home / Self Care | Payer: PPO | Source: Other Acute Inpatient Hospital | Attending: Internal Medicine | Admitting: Internal Medicine

## 2016-12-27 ENCOUNTER — Inpatient Hospital Stay (HOSPITAL_COMMUNITY): Payer: PPO

## 2016-12-27 DIAGNOSIS — N390 Urinary tract infection, site not specified: Secondary | ICD-10-CM | POA: Diagnosis not present

## 2016-12-27 DIAGNOSIS — Z4682 Encounter for fitting and adjustment of non-vascular catheter: Secondary | ICD-10-CM | POA: Diagnosis not present

## 2016-12-27 DIAGNOSIS — I2510 Atherosclerotic heart disease of native coronary artery without angina pectoris: Secondary | ICD-10-CM | POA: Diagnosis present

## 2016-12-27 DIAGNOSIS — Z452 Encounter for adjustment and management of vascular access device: Secondary | ICD-10-CM

## 2016-12-27 DIAGNOSIS — Z794 Long term (current) use of insulin: Secondary | ICD-10-CM | POA: Diagnosis not present

## 2016-12-27 DIAGNOSIS — E1165 Type 2 diabetes mellitus with hyperglycemia: Secondary | ICD-10-CM | POA: Diagnosis present

## 2016-12-27 DIAGNOSIS — G9340 Encephalopathy, unspecified: Secondary | ICD-10-CM | POA: Diagnosis not present

## 2016-12-27 DIAGNOSIS — J811 Chronic pulmonary edema: Secondary | ICD-10-CM

## 2016-12-27 DIAGNOSIS — F13239 Sedative, hypnotic or anxiolytic dependence with withdrawal, unspecified: Secondary | ICD-10-CM | POA: Diagnosis present

## 2016-12-27 DIAGNOSIS — J9690 Respiratory failure, unspecified, unspecified whether with hypoxia or hypercapnia: Secondary | ICD-10-CM | POA: Diagnosis not present

## 2016-12-27 DIAGNOSIS — Z978 Presence of other specified devices: Secondary | ICD-10-CM

## 2016-12-27 DIAGNOSIS — E871 Hypo-osmolality and hyponatremia: Secondary | ICD-10-CM | POA: Diagnosis not present

## 2016-12-27 DIAGNOSIS — Z7982 Long term (current) use of aspirin: Secondary | ICD-10-CM | POA: Diagnosis not present

## 2016-12-27 DIAGNOSIS — Z79899 Other long term (current) drug therapy: Secondary | ICD-10-CM

## 2016-12-27 DIAGNOSIS — M797 Fibromyalgia: Secondary | ICD-10-CM | POA: Diagnosis present

## 2016-12-27 DIAGNOSIS — Z8673 Personal history of transient ischemic attack (TIA), and cerebral infarction without residual deficits: Secondary | ICD-10-CM

## 2016-12-27 DIAGNOSIS — F4489 Other dissociative and conversion disorders: Secondary | ICD-10-CM | POA: Diagnosis not present

## 2016-12-27 DIAGNOSIS — Z781 Physical restraint status: Secondary | ICD-10-CM

## 2016-12-27 DIAGNOSIS — J810 Acute pulmonary edema: Secondary | ICD-10-CM

## 2016-12-27 DIAGNOSIS — J9600 Acute respiratory failure, unspecified whether with hypoxia or hypercapnia: Secondary | ICD-10-CM

## 2016-12-27 DIAGNOSIS — R451 Restlessness and agitation: Secondary | ICD-10-CM | POA: Diagnosis not present

## 2016-12-27 DIAGNOSIS — F329 Major depressive disorder, single episode, unspecified: Secondary | ICD-10-CM | POA: Diagnosis not present

## 2016-12-27 DIAGNOSIS — N179 Acute kidney failure, unspecified: Secondary | ICD-10-CM

## 2016-12-27 DIAGNOSIS — M1990 Unspecified osteoarthritis, unspecified site: Secondary | ICD-10-CM | POA: Diagnosis not present

## 2016-12-27 DIAGNOSIS — M21371 Foot drop, right foot: Secondary | ICD-10-CM | POA: Diagnosis present

## 2016-12-27 DIAGNOSIS — R569 Unspecified convulsions: Principal | ICD-10-CM

## 2016-12-27 DIAGNOSIS — I10 Essential (primary) hypertension: Secondary | ICD-10-CM

## 2016-12-27 DIAGNOSIS — F419 Anxiety disorder, unspecified: Secondary | ICD-10-CM | POA: Diagnosis present

## 2016-12-27 DIAGNOSIS — S199XXA Unspecified injury of neck, initial encounter: Secondary | ICD-10-CM | POA: Diagnosis not present

## 2016-12-27 DIAGNOSIS — Z23 Encounter for immunization: Secondary | ICD-10-CM | POA: Diagnosis not present

## 2016-12-27 DIAGNOSIS — Z4659 Encounter for fitting and adjustment of other gastrointestinal appliance and device: Secondary | ICD-10-CM | POA: Diagnosis not present

## 2016-12-27 DIAGNOSIS — J9601 Acute respiratory failure with hypoxia: Secondary | ICD-10-CM | POA: Diagnosis not present

## 2016-12-27 DIAGNOSIS — G8929 Other chronic pain: Secondary | ICD-10-CM | POA: Diagnosis present

## 2016-12-27 DIAGNOSIS — E119 Type 2 diabetes mellitus without complications: Secondary | ICD-10-CM

## 2016-12-27 DIAGNOSIS — R404 Transient alteration of awareness: Secondary | ICD-10-CM | POA: Diagnosis not present

## 2016-12-27 DIAGNOSIS — E876 Hypokalemia: Secondary | ICD-10-CM | POA: Diagnosis not present

## 2016-12-27 DIAGNOSIS — J189 Pneumonia, unspecified organism: Secondary | ICD-10-CM | POA: Diagnosis not present

## 2016-12-27 DIAGNOSIS — I6789 Other cerebrovascular disease: Secondary | ICD-10-CM | POA: Diagnosis not present

## 2016-12-27 DIAGNOSIS — J690 Pneumonitis due to inhalation of food and vomit: Secondary | ICD-10-CM | POA: Diagnosis not present

## 2016-12-27 DIAGNOSIS — R4182 Altered mental status, unspecified: Secondary | ICD-10-CM | POA: Diagnosis not present

## 2016-12-27 LAB — CBC WITH DIFFERENTIAL/PLATELET
Basophils Absolute: 0 10*3/uL (ref 0.0–0.1)
Basophils Relative: 0 %
EOS ABS: 0 10*3/uL (ref 0.0–0.7)
Eosinophils Relative: 0 %
HCT: 38.5 % (ref 36.0–46.0)
HEMOGLOBIN: 12.7 g/dL (ref 12.0–15.0)
LYMPHS ABS: 1.3 10*3/uL (ref 0.7–4.0)
LYMPHS PCT: 9 %
MCH: 28.5 pg (ref 26.0–34.0)
MCHC: 33 g/dL (ref 30.0–36.0)
MCV: 86.5 fL (ref 78.0–100.0)
MONOS PCT: 5 %
Monocytes Absolute: 0.8 10*3/uL (ref 0.1–1.0)
NEUTROS PCT: 86 %
Neutro Abs: 13.3 10*3/uL — ABNORMAL HIGH (ref 1.7–7.7)
Platelets: 194 10*3/uL (ref 150–400)
RBC: 4.45 MIL/uL (ref 3.87–5.11)
RDW: 13.4 % (ref 11.5–15.5)
WBC: 15.4 10*3/uL — ABNORMAL HIGH (ref 4.0–10.5)

## 2016-12-27 LAB — GLUCOSE, CAPILLARY
GLUCOSE-CAPILLARY: 267 mg/dL — AB (ref 65–99)
GLUCOSE-CAPILLARY: 299 mg/dL — AB (ref 65–99)
Glucose-Capillary: 362 mg/dL — ABNORMAL HIGH (ref 65–99)

## 2016-12-27 LAB — BLOOD GAS, ARTERIAL
ACID-BASE EXCESS: 0.2 mmol/L (ref 0.0–2.0)
BICARBONATE: 24.8 mmol/L (ref 20.0–28.0)
DRAWN BY: 39899
FIO2: 100
LHR: 18 {breaths}/min
MECHVT: 460 mL
O2 Saturation: 99.6 %
PATIENT TEMPERATURE: 98.6
PCO2 ART: 44.1 mmHg (ref 32.0–48.0)
PEEP/CPAP: 10 cmH2O
PH ART: 7.369 (ref 7.350–7.450)
PO2 ART: 234 mmHg — AB (ref 83.0–108.0)

## 2016-12-27 LAB — MRSA PCR SCREENING: MRSA by PCR: POSITIVE — AB

## 2016-12-27 LAB — COMPREHENSIVE METABOLIC PANEL
ALK PHOS: 69 U/L (ref 38–126)
ALT: 20 U/L (ref 14–54)
ANION GAP: 14 (ref 5–15)
AST: 46 U/L — ABNORMAL HIGH (ref 15–41)
Albumin: 3.1 g/dL — ABNORMAL LOW (ref 3.5–5.0)
BILIRUBIN TOTAL: 1.3 mg/dL — AB (ref 0.3–1.2)
BUN: 7 mg/dL (ref 6–20)
CALCIUM: 8.7 mg/dL — AB (ref 8.9–10.3)
CO2: 24 mmol/L (ref 22–32)
Chloride: 99 mmol/L — ABNORMAL LOW (ref 101–111)
Creatinine, Ser: 0.8 mg/dL (ref 0.44–1.00)
GFR calc Af Amer: >60 mL/min (ref >60)
Glucose, Bld: 368 mg/dL — ABNORMAL HIGH (ref 65–99)
Potassium: 4.4 mmol/L (ref 3.5–5.1)
Sodium: 137 mmol/L (ref 135–145)
TOTAL PROTEIN: 6.8 g/dL (ref 6.5–8.1)

## 2016-12-27 LAB — LACTIC ACID, PLASMA
LACTIC ACID, VENOUS: 3.1 mmol/L — AB (ref 0.5–1.9)
LACTIC ACID, VENOUS: 1.9 mmol/L (ref 0.5–1.9)
Lactic Acid, Venous: 2.4 mmol/L (ref 0.5–1.9)

## 2016-12-27 LAB — PROCALCITONIN
Procalcitonin: 1.14 ng/mL
Procalcitonin: 1.71 ng/mL

## 2016-12-27 LAB — MAGNESIUM
MAGNESIUM: 1.8 mg/dL (ref 1.7–2.4)
MAGNESIUM: 1.9 mg/dL (ref 1.7–2.4)

## 2016-12-27 LAB — TRIGLYCERIDES: TRIGLYCERIDES: 293 mg/dL — AB (ref <150)

## 2016-12-27 LAB — TROPONIN I
TROPONIN I: 0.47 ng/mL — AB (ref <0.03)
Troponin I: 0.27 ng/mL (ref <0.03)

## 2016-12-27 LAB — PHOSPHORUS
PHOSPHORUS: 3.5 mg/dL (ref 2.5–4.6)
Phosphorus: 4.6 mg/dL (ref 2.5–4.6)

## 2016-12-27 LAB — PROTIME-INR
INR: 1.11
PROTHROMBIN TIME: 14.3 s (ref 11.4–15.2)

## 2016-12-27 LAB — AMMONIA: Ammonia: 20 umol/L (ref 9–35)

## 2016-12-27 MED ORDER — LABETALOL HCL 5 MG/ML IV SOLN
20.0000 mg | INTRAVENOUS | Status: DC | PRN
  Administered 2016-12-28 – 2017-01-02 (×6): 20 mg via INTRAVENOUS
  Filled 2016-12-27 (×7): qty 4

## 2016-12-27 MED ORDER — ASPIRIN 325 MG PO TABS
325.0000 mg | ORAL_TABLET | Freq: Every day | ORAL | Status: DC
  Administered 2016-12-28 – 2017-01-04 (×7): 325 mg
  Filled 2016-12-27 (×8): qty 1

## 2016-12-27 MED ORDER — LABETALOL HCL 5 MG/ML IV SOLN
INTRAVENOUS | Status: AC
  Administered 2016-12-27: 20 mg
  Filled 2016-12-27: qty 4

## 2016-12-27 MED ORDER — HEPARIN SODIUM (PORCINE) 5000 UNIT/ML IJ SOLN
5000.0000 [IU] | Freq: Three times a day (TID) | INTRAMUSCULAR | Status: DC
  Administered 2016-12-27 – 2017-01-05 (×26): 5000 [IU] via SUBCUTANEOUS
  Filled 2016-12-27 (×26): qty 1

## 2016-12-27 MED ORDER — FENTANYL CITRATE (PF) 100 MCG/2ML IJ SOLN
50.0000 ug | INTRAMUSCULAR | Status: DC | PRN
  Administered 2016-12-27 – 2016-12-28 (×3): 50 ug via INTRAVENOUS
  Filled 2016-12-27 (×4): qty 2

## 2016-12-27 MED ORDER — PANTOPRAZOLE SODIUM 40 MG IV SOLR
40.0000 mg | Freq: Every day | INTRAVENOUS | Status: DC
  Administered 2016-12-27 – 2017-01-03 (×8): 40 mg via INTRAVENOUS
  Filled 2016-12-27 (×8): qty 40

## 2016-12-27 MED ORDER — CHLORHEXIDINE GLUCONATE 0.12% ORAL RINSE (MEDLINE KIT)
15.0000 mL | Freq: Two times a day (BID) | OROMUCOSAL | Status: DC
  Administered 2016-12-27 – 2016-12-28 (×2): 15 mL via OROMUCOSAL

## 2016-12-27 MED ORDER — PROPOFOL 1000 MG/100ML IV EMUL
0.0000 ug/kg/min | INTRAVENOUS | Status: DC
  Administered 2016-12-27: 50 ug/kg/min via INTRAVENOUS
  Administered 2016-12-27: 35 ug/kg/min via INTRAVENOUS
  Administered 2016-12-27 – 2016-12-28 (×3): 50 ug/kg/min via INTRAVENOUS
  Administered 2016-12-28: 45 ug/kg/min via INTRAVENOUS
  Administered 2016-12-28: 40 ug/kg/min via INTRAVENOUS
  Administered 2016-12-28 – 2016-12-29 (×2): 50 ug/kg/min via INTRAVENOUS
  Administered 2016-12-29: 40 ug/kg/min via INTRAVENOUS
  Administered 2016-12-29: 45 ug/kg/min via INTRAVENOUS
  Administered 2016-12-29: 50 ug/kg/min via INTRAVENOUS
  Administered 2016-12-29: 40 ug/kg/min via INTRAVENOUS
  Administered 2016-12-29: 25 ug/kg/min via INTRAVENOUS
  Administered 2016-12-30 (×5): 40 ug/kg/min via INTRAVENOUS
  Administered 2016-12-31: 20 ug/kg/min via INTRAVENOUS
  Administered 2016-12-31: 10 ug/kg/min via INTRAVENOUS
  Filled 2016-12-27 (×24): qty 100

## 2016-12-27 MED ORDER — INSULIN ASPART 100 UNIT/ML ~~LOC~~ SOLN
0.0000 [IU] | SUBCUTANEOUS | Status: DC
  Administered 2016-12-27: 11 [IU] via SUBCUTANEOUS
  Administered 2016-12-27: 20 [IU] via SUBCUTANEOUS
  Administered 2016-12-27: 11 [IU] via SUBCUTANEOUS
  Administered 2016-12-28: 4 [IU] via SUBCUTANEOUS
  Administered 2016-12-28: 7 [IU] via SUBCUTANEOUS
  Administered 2016-12-28: 4 [IU] via SUBCUTANEOUS
  Administered 2016-12-28: 7 [IU] via SUBCUTANEOUS
  Administered 2016-12-28 – 2016-12-29 (×2): 4 [IU] via SUBCUTANEOUS
  Administered 2016-12-29: 3 [IU] via SUBCUTANEOUS
  Administered 2016-12-29: 7 [IU] via SUBCUTANEOUS
  Administered 2016-12-29: 3 [IU] via SUBCUTANEOUS
  Administered 2016-12-29 (×2): 4 [IU] via SUBCUTANEOUS
  Administered 2016-12-30: 7 [IU] via SUBCUTANEOUS
  Administered 2016-12-30 (×2): 4 [IU] via SUBCUTANEOUS
  Administered 2016-12-30 (×3): 7 [IU] via SUBCUTANEOUS
  Administered 2016-12-31 (×3): 4 [IU] via SUBCUTANEOUS
  Administered 2016-12-31 (×2): 7 [IU] via SUBCUTANEOUS
  Administered 2016-12-31: 4 [IU] via SUBCUTANEOUS
  Administered 2017-01-01 (×6): 7 [IU] via SUBCUTANEOUS
  Administered 2017-01-02: 11 [IU] via SUBCUTANEOUS
  Administered 2017-01-02: 7 [IU] via SUBCUTANEOUS
  Administered 2017-01-02: 15 [IU] via SUBCUTANEOUS
  Administered 2017-01-02: 7 [IU] via SUBCUTANEOUS
  Administered 2017-01-02: 15 [IU] via SUBCUTANEOUS
  Administered 2017-01-02 – 2017-01-03 (×2): 11 [IU] via SUBCUTANEOUS
  Administered 2017-01-03: 3 [IU] via SUBCUTANEOUS
  Administered 2017-01-03 (×3): 11 [IU] via SUBCUTANEOUS
  Administered 2017-01-03 – 2017-01-04 (×3): 4 [IU] via SUBCUTANEOUS
  Administered 2017-01-04 – 2017-01-05 (×4): 3 [IU] via SUBCUTANEOUS
  Administered 2017-01-05: 4 [IU] via SUBCUTANEOUS
  Administered 2017-01-05: 3 [IU] via SUBCUTANEOUS

## 2016-12-27 MED ORDER — ROCURONIUM BROMIDE 50 MG/5ML IV SOLN
1.0000 mg/kg | Freq: Once | INTRAVENOUS | Status: AC
  Administered 2016-12-27: 50 mg via INTRAVENOUS

## 2016-12-27 MED ORDER — FENTANYL CITRATE (PF) 100 MCG/2ML IJ SOLN
INTRAMUSCULAR | Status: AC
  Administered 2016-12-27: 100 ug
  Filled 2016-12-27: qty 2

## 2016-12-27 MED ORDER — SODIUM CHLORIDE 0.9 % IV SOLN
INTRAVENOUS | Status: DC
  Administered 2016-12-27 – 2016-12-29 (×6): via INTRAVENOUS

## 2016-12-27 MED ORDER — MIDAZOLAM HCL 2 MG/2ML IJ SOLN
INTRAMUSCULAR | Status: AC
  Administered 2016-12-27: 2 mg
  Filled 2016-12-27: qty 2

## 2016-12-27 MED ORDER — ORAL CARE MOUTH RINSE
15.0000 mL | Freq: Two times a day (BID) | OROMUCOSAL | Status: DC
Start: 2016-12-27 — End: 2016-12-27

## 2016-12-27 MED ORDER — ORAL CARE MOUTH RINSE
15.0000 mL | Freq: Four times a day (QID) | OROMUCOSAL | Status: DC
  Administered 2016-12-28 (×3): 15 mL via OROMUCOSAL

## 2016-12-27 MED ORDER — SODIUM CHLORIDE 0.9 % IV SOLN: 250.0000 mL | INTRAVENOUS | Status: DC | PRN

## 2016-12-27 MED ORDER — LORAZEPAM 2 MG/ML IJ SOLN
1.0000 mg | INTRAMUSCULAR | Status: DC | PRN
  Administered 2016-12-28: 1 mg via INTRAVENOUS
  Administered 2016-12-30 – 2016-12-31 (×3): 2 mg via INTRAVENOUS
  Filled 2016-12-27 (×3): qty 1

## 2016-12-27 MED ORDER — CHLORHEXIDINE GLUCONATE 0.12 % MT SOLN
15.0000 mL | Freq: Two times a day (BID) | OROMUCOSAL | Status: DC
  Administered 2016-12-27 (×2): 15 mL via OROMUCOSAL
  Filled 2016-12-27: qty 15

## 2016-12-27 MED ORDER — ETOMIDATE 2 MG/ML IV SOLN
0.3000 mg/kg | Freq: Once | INTRAVENOUS | Status: AC
  Administered 2016-12-27: 30 mg via INTRAVENOUS

## 2016-12-27 MED ORDER — ALBUTEROL SULFATE (2.5 MG/3ML) 0.083% IN NEBU: 2.5000 mg | INHALATION_SOLUTION | RESPIRATORY_TRACT | Status: DC | PRN

## 2016-12-27 MED ORDER — FENTANYL CITRATE (PF) 100 MCG/2ML IJ SOLN
50.0000 ug | INTRAMUSCULAR | Status: AC | PRN
  Administered 2016-12-27 – 2016-12-28 (×3): 50 ug via INTRAVENOUS
  Filled 2016-12-27: qty 2

## 2016-12-27 MED ORDER — PNEUMOCOCCAL VAC POLYVALENT 25 MCG/0.5ML IJ INJ
0.5000 mL | INJECTION | INTRAMUSCULAR | Status: AC
  Administered 2016-12-28: 0.5 mL via INTRAMUSCULAR

## 2016-12-27 NOTE — Procedures (Signed)
Intubation Procedure Note Virginia [name] [id number] 1946-11-04  Procedure: Intubation Indications: Respiratory insufficiency  Procedure Details Consent: Unable to obtain consent because of emergent medical necessity. Time Out: Verified patient identification, verified procedure, site/side was marked, verified correct patient position, special equipment/implants available, medications/allergies/relevent history reviewed, required imaging and test results available.  Performed  Maximum sterile technique was used including gloves, gown, hand hygiene and mask.  MAC    Evaluation Hemodynamic Status: BP stable throughout; O2 sats: stable throughout Patient's Current Condition: stable Complications: No apparent complications Patient did tolerate procedure well. Chest X-ray ordered to verify placement.  CXR: pending.   Virginia [name] 12/27/2016

## 2016-12-27 NOTE — Procedures (Signed)
ELECTROENCEPHALOGRAM REPORT  Date of Study: 12/27/2016  Patient's Name: Virginia [name] MRN: [id number] Date of Birth: 1946/02/05  Referring Provider: Vania Reaavid R. Katrinka BlazingSmith, PA-C  Clinical History: 71 year old woman with seizure, intubated and sedated on propofol.  Evaluate for subclinical seizures.  Hospital Medications: fentaNYL (SUBLIMAZE) injection 50 mcg  insulin aspart (novoLOG) injection 0-20 Units  LORazepam (ATIVAN) injection 1-4 mg  pantoprazole (PROTONIX) injection 40 mg  propofol (DIPRIVAN) 1000 MG/100ML infusion  Home Medications: aspirin 325 MG tablet  baclofen (LIORESAL) 10 MG tablet  citalopram (CELEXA) 20 MG tablet  DULoxetine (CYMBALTA) 60 MG capsule  fentaNYL (DURAGESIC - DOSED MCG/HR) 75 MCG/HRgabapentin (NEURONTIN) 300 MG capsuleglipiZIDE (GLUCOTROL) 10 MG tablet  lisinopril (PRINIVIL,ZESTRIL) 40 MG tablet  LORazepam (ATIVAN) 2 MG tablet  oxyCODONE (OXY IR/ROXICODONE) 5 MG immediate release tablet  acetaminophen (TYLENOL) 500 MG tablet  albuterol (PROVENTIL HFA;VENTOLIN HFA) 108 (90 BASE) MCG/ACT inhaler  diphenhydrAMINE (BENADRYL) 25 MG tablet  hydrochlorothiazide (HYDRODIURIL) 25 MG tablet  hydrOXYzine (ATARAX/VISTARIL) 25 MG tablet  insulin NPH Human (HUMULIN N,NOVOLIN N) 100 NOVOLOG 100 UNIT/ML injection   Technical Summary: A multichannel digital EEG recording measured by the international 10-20 system with electrodes applied with paste and impedances below 5000 ohms performed as portable with EKG monitoring in an intubated and sedated patient currently being weaned off of propofol.  Hyperventilation and photic stimulation were not performed.  The digital EEG was referentially recorded, reformatted, and digitally filtered in a variety of bipolar and referential montages for optimal display.   Description: The patient is intubated and sedated, currently being weaned off of propofol during the recording.  There is no discernible posterior dominant rhythm.  There  is diffuse 4-5 Hz theta and 2-3 Hz delta slowing of the background admixed with some faster frequencies of 7.5 Hz. Stage 2 sleep is not seen.  There were no epileptiform discharges or electrographic seizures seen.    EKG lead was unremarkable.  Impression: This sedated EEG is abnormal due to diffuse slowing of the waking background.  Clinical Correlation of the above findings indicates diffuse cerebral dysfunction that is non-specific in etiology and can be seen with hypoxic/ischemic injury, toxic/metabolic encephalopathies, neurodegenerative disorders, or medication effect.  Clinical correlation is advised.  Shon MilletAdam Germaine Shenker, DO

## 2016-12-27 NOTE — Progress Notes (Signed)
Bedside EEG completed, results pending. 

## 2016-12-27 NOTE — H&P (Signed)
PULMONARY / CRITICAL CARE MEDICINE   Name: Virginia [name] MRN: [id number] DOB: 1946/10/22    ADMISSION DATE:  12/27/2016 CONSULTATION DATE:  12/27/16  REFERRING MD:  Virginia [name]   CHIEF COMPLAINT:  Seizures  HISTORY OF PRESENT ILLNESS:  Pt is encephelopathic; therefore, this HPI is obtained from chart review. Virginia [name] is a 71 y.o. female with PMH as outlined below. She was taken to [facility] 01/29 for seizures.  Per her husband, she was doing well earlier that morning and was watching TV with him when she suddenly gasped, had eyes roll back, arms flex, and turned blue.  He initiated CPR although no clear answer whether she lost pulses.  EMS arrived and pt was altered then became responsive after sternal rub before becoming combative.  In ED, she remained combative and was intubated for airway protection.  She was then transferred to [facility] for evaluation by neuro.  PAST MEDICAL HISTORY :  She  has a past medical history of Arthritis; Diabetes mellitus without complication; Difficulty sleeping; H/O Bell's palsy (left); History of blood transfusion; Hypertension; Spinal stenosis of lumbar region; and Stroke.  PAST SURGICAL HISTORY: She  has a past surgical history that includes Cholecystectomy; Abdominal hysterectomy; Oophorectomy; Bladder surgery; Cervical spine surgery; Lumbar laminectomy/decompression microdiscectomy (N/A, 01/31/2013); Cholecystectomy open (N/A); and Vaginal hysterectomy.  Allergies  Allergen Reactions  . Shellfish Allergy Anaphylaxis, Swelling and Rash  . Iodine Hives  . Latex Itching  . Morphine And Related     No current facility-administered medications on file prior to encounter.    Current Outpatient Prescriptions on File Prior to Encounter  Medication Sig  . acetaminophen (TYLENOL) 500 MG tablet Take 1,000 mg by mouth every 6 (six) hours as needed for moderate pain or headache.  . albuterol (PROVENTIL HFA;VENTOLIN HFA) 108 (90 BASE) MCG/ACT inhaler  Inhale 2 puffs into the lungs every 4 (four) hours as needed for wheezing or shortness of breath.  Marland Kitchen aspirin 325 MG tablet Take 325 mg by mouth daily.  . diphenhydrAMINE (BENADRYL) 25 MG tablet Take 50 mg by mouth at bedtime as needed for sleep.   . DULoxetine (CYMBALTA) 60 MG capsule Take 60 mg by mouth daily.  . fentaNYL (DURAGESIC - DOSED MCG/HR) 75 MCG/HR Place 75 mcg onto the skin every 3 (three) days.  Marland Kitchen gabapentin (NEURONTIN) 300 MG capsule Take 600 mg by mouth at bedtime.  Marland Kitchen glipiZIDE (GLUCOTROL) 10 MG tablet Take 10 mg by mouth 2 (two) times daily.  . hydrochlorothiazide (HYDRODIURIL) 25 MG tablet Take 25 mg by mouth daily as needed (for fluid).  . hydrOXYzine (ATARAX/VISTARIL) 25 MG tablet Take 1 tablet (25 mg total) by mouth every 8 (eight) hours as needed for anxiety.  . insulin NPH Human (HUMULIN N,NOVOLIN N) 100 UNIT/ML injection Inject 60 Units into the skin 2 (two) times daily. *May take a third dose of 60 units for high blood sugars*  . levofloxacin (LEVAQUIN) 750 MG tablet Take 1 tablet (750 mg total) by mouth daily.  Marland Kitchen lisinopril (PRINIVIL,ZESTRIL) 40 MG tablet Take 40 mg by mouth daily.  Marland Kitchen NOVOLOG 100 UNIT/ML injection Inject 1-10 Units into the skin 3 (three) times daily as needed. For blood sugar    FAMILY HISTORY:  Her indicated that her sister is alive.    SOCIAL HISTORY: She  reports that she has never smoked. She does not have any smokeless tobacco history on file. She reports that she does not drink alcohol or use drugs.  REVIEW OF SYSTEMS:  Unable to obtain as pt is encephalopathic.  SUBJECTIVE:  On vent, follows commands.  VITAL SIGNS: BP (!) 165/100   Pulse (!) 109   Temp 99.6 F (37.6 C) (Axillary)   Resp 18   Ht 5' (1.524 m)   Wt 98.4 kg (216 lb 14.9 oz)   SpO2 100%   BMI 42.37 kg/m   HEMODYNAMICS:    VENTILATOR SETTINGS: Vent Mode: PRVC FiO2 (%):  [50 %] 50 % Set Rate:  [15 bmp] 15 bmp Vt Set:  [460 mL] 460 mL PEEP:  [5 cmH20] 5  cmH20 Plateau Pressure:  [19 cmH20] 19 cmH20  INTAKE / OUTPUT: No intake/output data recorded.   PHYSICAL EXAMINATION: General: Middle aged female, in NAD. Neuro: Sedated but follows commands.  Moves all extremities. HEENT: [place]/AT. PERRL, sclerae anicteric. Cardiovascular: RRR, no M/R/G.  Lungs: Respirations even and unlabored.  CTA bilaterally, No W/R/R. Abdomen: BS x 4, soft, NT/ND.  Musculoskeletal: No gross deformities, no edema.  Skin: Intact, warm, no rashes.  LABS:  BMET No results for input(s): NA, K, CL, CO2, [name], CREATININE, GLUCOSE in the last 168 hours.  Electrolytes No results for input(s): CALCIUM, MG, PHOS in the last 168 hours.  CBC No results for input(s): WBC, HGB, HCT, PLT in the last 168 hours.  Coag's No results for input(s): APTT, INR in the last 168 hours.  Sepsis Markers No results for input(s): LATICACIDVEN, PROCALCITON, O2SATVEN in the last 168 hours.  ABG No results for input(s): PHART, PCO2ART, PO2ART in the last 168 hours.  Liver Enzymes No results for input(s): AST, ALT, ALKPHOS, BILITOT, ALBUMIN in the last 168 hours.  Cardiac Enzymes No results for input(s): TROPONINI, PROBNP in the last 168 hours.  Glucose No results for input(s): GLUCAP in the last 168 hours.  Imaging No results found.   STUDIES:  CT head 01/29 (OSH) > no acute process. CXR 01/29 > no acute process.  CULTURES: None.  ANTIBIOTICS: None.  SIGNIFICANT EVENTS: 01/29 > transfer [facility]  LINES/TUBES: ETT 01/29 >  DISCUSSION: 71 y.o. female taken to [facility] 01/29 for seizures.  Required intubation in ED then transferred to [facility] for neuro evaluation.  ASSESSMENT / PLAN:  NEUROLOGIC A:   Acute encephalopathy - ? Seizures / status. Hx stroke neuropathy, bulging disks, fibromyalgia, anxiety, depression, right eye bells palsy, chronic pain, right foot drop. P:   Sedation:  Propofol gtt / Fentanyl PRN - off for now with goal extubation in next hour. RASS  goal: 0 to -1. Daily WUA. Hold preadmission baclofen, citalopram, duloxetine, fentanyl patch, gabapentin, hydroxyzine, lorazepam, oxycodone.  PULMONARY A: VDRF - due to concerns for airway protection in setting of seizures / possible status.  Now stable and following all commands. P:   Continue PSV - hopeful for extubation in next hour. Albuterol PRN. Pulmonary hygiene.  CARDIOVASCULAR A:  Hx HTN, CAD. P:  Monitor hemodynamics. Trend troponin, lactate. Continue preadmission ASA. Hold preadmission furosemide, HCTZ, lisinopril, metoprolol tartrate.  RENAL A:   AGMA - due to lactate. Mild hyponatremia, hypokalemia. P:   NS @ 75. CMP now. Correct electrolytes as indicated. BMP in AM.  GASTROINTESTINAL A:   GI prophylaxis. Nutrition. P:   SUP: Pantoprazole. NPO.  HEMATOLOGIC A:   VTE Prophylaxis. P:  SCD's / heparin. CBC in AM.  INFECTIOUS A:   No indication of infection. P:   Monitor clinically.  ENDOCRINE A:   DM. P:   SSI. Assess hgb A1c. Hold preadmission glipizide, humulin, novolog.  Family updated: None available.  Interdisciplinary Family Meeting v Palliative Care Meeting:  Due by: 02/04.  CC time: 35 minutes.  Virginia [name], [place]PA - [facility] [place] [facility] Pager: [phone]  or [phone] 12/27/2016, 1:16 PM  Attending Note:  71 year old female with history of CVAs in the past who presents with AMS and new onset seizure.  Patient was sedated and intubated for airway protection.  [facility].  On exam, patient is agitated with propofol through an IO.  She is able to move all ext to command and responds appropriately with clear lungs.  I reviewed head CT myself, no acute disease noted.  CXR that I reviewed myself as well with ETT in good position.  Attempted weaning then extubation but patient failed.  Will reintubate, place TLC in and sedate while neurology finishes work up.   EEG done and pending and MRI pending.  The patient is critically ill with multiple organ systems failure and requires high complexity decision making for assessment and support, frequent evaluation and titration of therapies, application of advanced monitoring technologies and extensive interpretation of multiple databases.   Critical Care Time devoted to patient care services described in this note is  35  Minutes. This time reflects time of care of this signee Dr Koren BoundWesam Yacoub. This critical care time does not reflect procedure time, or teaching time or supervisory time of PA/NP/Med student/Med Resident etc but could involve care discussion time.  Alyson ReedyWesam G. Yacoub, M.D. [facility]. Pager: [phone]. After hours pager: [phone].

## 2016-12-27 NOTE — Progress Notes (Signed)
[facility]link [facility]. Pt intubated with 7.5 ett secured at 23 at the lip, no subglottic suction. Carelink reported ABG within normal limits with vetn settings PRVC 460/R 20/45%/Peep %. Pt in no distress. VS within normal limits. Diminished BS throughout. RT will continue to monitor

## 2016-12-27 NOTE — Progress Notes (Signed)
Critical Lab Values:  Troponin 0.27  Lactic Acid 2.4  Both results called into CCM PA, Storm Friskaul Desai.

## 2016-12-27 NOTE — Procedures (Signed)
Arterial Catheter Insertion Procedure Note Virginia [name] [id number] 1946/05/19  Procedure: Insertion of Arterial Catheter  Indications: Blood pressure monitoring and Frequent blood sampling  Procedure Details Consent: Unable to obtain consent because of emergent medical necessity. Time Out: Verified patient identification, verified procedure, site/side was marked, verified correct patient position, special equipment/implants available, medications/allergies/relevent history reviewed, required imaging and test results available.  Performed  Maximum sterile technique was used including antiseptics, cap, gloves, gown, hand hygiene, mask and sheet. Skin prep: Chlorhexidine; local anesthetic administered 20 gauge catheter was inserted into right radial artery using the Seldinger technique.  Evaluation Blood flow good; BP tracing good. Complications: No apparent complications.   Koren BoundYACOUB,Tanis Hensarling 12/27/2016

## 2016-12-27 NOTE — Consult Note (Signed)
NEURO HOSPITALIST CONSULT NOTE   Requestig physician: PCCMD   Reason for Consult:Seizure and possible status epilepticus   History obtained from:  Family  HPI:                                                                                                                                          Virginia [name] is an 71 y.o. female per husband patient was doing well this morning and watching TV with him. Suddenly he heard her gasp and noted that her eyes were rolled back, arms were flexed across her chest and legs were held on extension. This was followed by rhythmic jerking for approximately 30 seconds and then patient was obtunded. EMS came and patient was brought to [facility]. [facility] patient was intubated for airway protection. Currently patient is on propofol.  Patient was transferred to [facility] and now she is on neurological ICU. Currently she is intubated breathing over the vent. Patient does not respond to sternal rub. No clinical seizure activity is noted. Patient does not blink to threat and eyes are disconjugate. Currently EEG is been placed to evaluate for subclinical seizures.  Past Medical History:  Diagnosis Date  . Arthritis   . Diabetes mellitus without complication    fasting 120-130s  . Difficulty sleeping    due to back pain  . H/O Bell's palsy left  . History of blood transfusion   . Hypertension   . Spinal stenosis of lumbar region   . Stroke    behind rt eye / visual defficiet rt eye    Past Surgical History:  Procedure Laterality Date  . ABDOMINAL HYSTERECTOMY    . BLADDER SURGERY     bladder tack  . CERVICAL SPINE SURGERY     cervical fusion  . CHOLECYSTECTOMY    . CHOLECYSTECTOMY OPEN N/A   . LUMBAR LAMINECTOMY/DECOMPRESSION MICRODISCECTOMY N/A 01/31/2013   Procedure: MICRO LUMBAR DECOMPRESSION L4-5;  Surgeon: Javier Docker, MD;  Location: [facility] ORS;  Service: Orthopedics;  Laterality: N/A;  Bilateral  Decompression L4-L5  . OOPHORECTOMY    . VAGINAL HYSTERECTOMY      Family History  Problem Relation Age of Onset  . Kidney failure Sister      Social History:  reports that she has never smoked. She does not have any smokeless tobacco history on file. She reports that she does not drink alcohol or use drugs.  Allergies  Allergen Reactions  . Shellfish Allergy Swelling and Rash    MEDICATIONS:  Prior to Admission:  Prescriptions Prior to Admission  Medication Sig Dispense Refill Last Dose  . acetaminophen (TYLENOL) 500 MG tablet Take 1,000 mg by mouth every 6 (six) hours as needed for moderate pain or headache.   07/27/2015 at Unknown time  . albuterol (PROVENTIL HFA;VENTOLIN HFA) 108 (90 BASE) MCG/ACT inhaler Inhale 2 puffs into the lungs every 4 (four) hours as needed for wheezing or shortness of breath. 1 Inhaler 2   . aspirin 325 MG tablet Take 325 mg by mouth daily.   07/28/2015 at Unknown time  . diphenhydrAMINE (BENADRYL) 25 MG tablet Take 50 mg by mouth at bedtime as needed for sleep.    07/27/2015 at Unknown time  . DULoxetine (CYMBALTA) 60 MG capsule Take 60 mg by mouth daily.   07/28/2015 at Unknown time  . fentaNYL (DURAGESIC - DOSED MCG/HR) 75 MCG/HR Place 75 mcg onto the skin every 3 (three) days.   07/27/2015 at Unknown time  . gabapentin (NEURONTIN) 300 MG capsule Take 600 mg by mouth at bedtime.   07/27/2015 at Unknown time  . glipiZIDE (GLUCOTROL) 10 MG tablet Take 10 mg by mouth 2 (two) times daily.   07/28/2015 at Unknown time  . hydrochlorothiazide (HYDRODIURIL) 25 MG tablet Take 25 mg by mouth daily as needed (for fluid).   Past Month at Unknown time  . hydrOXYzine (ATARAX/VISTARIL) 25 MG tablet Take 1 tablet (25 mg total) by mouth every 8 (eight) hours as needed for anxiety. 12 tablet 0   . insulin NPH Human (HUMULIN N,NOVOLIN N) 100 UNIT/ML injection  Inject 60 Units into the skin 2 (two) times daily. *May take a third dose of 60 units for high blood sugars*   07/28/2015 at Unknown time  . levofloxacin (LEVAQUIN) 750 MG tablet Take 1 tablet (750 mg total) by mouth daily. 5 tablet 0   . lisinopril (PRINIVIL,ZESTRIL) 40 MG tablet Take 40 mg by mouth daily.   07/28/2015 at Unknown time  . NOVOLOG 100 UNIT/ML injection Inject 1-10 Units into the skin 3 (three) times daily as needed. For blood sugar   unknown   Scheduled: . [START ON 12/28/2016] pneumococcal 23 valent vaccine  0.5 mL Intramuscular Tomorrow-1000     ROS:                                                                                                                                       History obtained from unobtainable from patient due to mental status     Pulse (!) 107, resp. rate 15, height 5' (1.524 m), weight 98.4 kg (216 lb 14.9 oz), SpO2 97 %.   Neurologic Examination:  HEENT-  Normocephalic, no lesions, without obvious abnormality.  Normal external eye and conjunctiva.  Normal TM's bilaterally.  Normal auditory canals and external ears. Normal external nose, mucus membranes and septum.  Normal pharynx. Cardiovascular- S1, S2 normal, pulses palpable throughout   Lungs- chest clear, no wheezing, rales, normal symmetric air entry Abdomen- normal findings: bowel sounds normal Extremities- no edema Lymph-no adenopathy palpable Musculoskeletal-no joint tenderness, deformity or swelling Skin-warm and dry, no hyperpigmentation, vitiligo, or suspicious lesions  Neurological Examination Mental Status: Currently patient is obtunded, intubated, breathing over the vent, follows no commands, does not respond to noxious stimuli. Cranial Nerves: II: No blink to threat, pupils equal, round, reactive to light and accommodation III,IV, VI: Eyes are held closed however when open eyes  are disconjugate halls intact V,VII: Face symmetric, no response to noxious stimuli along the facial region VIII: No response IX,X: Unable to visualize XI: No response XII: Unable to visualize Motor: Flaccid throughout with no increased tone or rhythmic activity Sensory: No response to pain Deep Tendon Reflexes: Depressed reflexes throughout Plantars: Mute bilaterally Cerebellar: Unable to assess Gait: Not tested      Lab Results: Basic Metabolic Panel: No results for input(s): NA, K, CL, CO2, GLUCOSE, BUN, CREATININE, CALCIUM, MG, PHOS in the last 168 hours.  Liver Function Tests: No results for input(s): AST, ALT, ALKPHOS, BILITOT, PROT, ALBUMIN in the last 168 hours. No results for input(s): LIPASE, AMYLASE in the last 168 hours. No results for input(s): AMMONIA in the last 168 hours.  CBC: No results for input(s): WBC, NEUTROABS, HGB, HCT, MCV, PLT in the last 168 hours.  Cardiac Enzymes: No results for input(s): CKTOTAL, CKMB, CKMBINDEX, TROPONINI in the last 168 hours.  Lipid Panel: No results for input(s): CHOL, TRIG, HDL, CHOLHDL, VLDL, LDLCALC in the last 168 hours.  CBG: No results for input(s): GLUCAP in the last 168 hours.  Microbiology: Results for orders placed or performed during the hospital encounter of 12/19/13  Urine culture     Status: None   Collection Time: 12/19/13 12:05 PM  Result Value Ref Range Status   Specimen Description URINE, CLEAN CATCH  Final   Special Requests NONE  Final   Culture  Setup Time   Final    12/19/2013 13:50 Performed at [facility]   Colony Count   Final    >=100,000 COLONIES/ML Performed at [facility]   Culture   Final    ESCHERICHIA COLI GROUP B STREP(S.AGALACTIAE)ISOLATED Note: TESTING AGAINST S. AGALACTIAE NOT ROUTINELY PERFORMED DUE TO PREDICTABILITY OF AMP/PEN/VAN SUSCEPTIBILITY. Performed at [facility]   Report Status 12/21/2013 FINAL  Final   Organism ID, Bacteria  ESCHERICHIA COLI  Final      Susceptibility   Escherichia coli - MIC*    AMPICILLIN 8 SENSITIVE Sensitive     CEFAZOLIN <=4 SENSITIVE Sensitive     CEFTRIAXONE <=1 SENSITIVE Sensitive     CIPROFLOXACIN <=0.25 SENSITIVE Sensitive     GENTAMICIN <=1 SENSITIVE Sensitive     LEVOFLOXACIN <=0.12 SENSITIVE Sensitive     NITROFURANTOIN 32 SENSITIVE Sensitive     TOBRAMYCIN <=1 SENSITIVE Sensitive     TRIMETH/SULFA <=20 SENSITIVE Sensitive     PIP/TAZO <=4 SENSITIVE Sensitive     * ESCHERICHIA COLI  MRSA PCR Screening     Status: None   Collection Time: 12/19/13  4:50 PM  Result Value Ref Range Status   MRSA by PCR NEGATIVE NEGATIVE Final    Comment:  The GeneXpert MRSA Assay (FDA approved for NASAL specimens only), is one component of a comprehensive MRSA colonization surveillance program. It is not intended to diagnose MRSA infection nor to guide or monitor treatment for MRSA infections.    Coagulation Studies: No results for input(s): LABPROT, INR in the last 72 hours.  Imaging: No results found.     Assessment and plan per attending neurologist  Felicie Morn PA-C [facility] [phone]  12/27/2016, 12:36 PM   Assessment/Plan:  71 year old female with acute event thought to be seizures. I think that it is unclear if this truly represent seizure versus myoclonic syncope. CPR was performed, after arrival to the emergency department in [place] she was combative and therefore intubated.  On arrival here, she was following commands and EEG did not demonstrate any signs of ongoing subclinical seizures.  At this time, though seizure is certainly a possibility, this would be her first unprovoked seizure presumably and therefore I will hold off on starting antiepileptic therapy at this time. If she does have any further seizures, however, I would start Keppra 500 twice a day.  1) MRI brain 2) neurology will follow  Ritta Slot, MD [facility] [phone]  If 7pm- 7am, please page neurology on call as listed in AMION.

## 2016-12-27 NOTE — Progress Notes (Signed)
eLink Physician-Brief Progress Note Patient Name: Virginia [name] DOB: 03-Feb-1946 MRN: [id number]   Date of Service  12/27/2016  HPI/Events of Note  Admit for seizures Lactate increased from 2.4 to 3.1  No clear evidence of infection   eICU Interventions  Continue to observe off antibiotics  Increase normal saline to 125cc/hr Check procalcitonin Follow Lactic acid     Intervention Category Major Interventions: Other:  Teniyah Seivert 12/27/2016, 8:18 PM

## 2016-12-27 NOTE — Procedures (Signed)
Extubation Procedure Note  Patient Details:   Name: Virginia [name] DOB: 02/07/1946 MRN: [id number]   Airway Documentation:  Airway 7.5 mm (Active)  Secured at (cm) 23 cm 12/27/2016 12:00 AM    Evaluation  O2 sats: transiently fell during during procedure Complications: Complications of tachycardia, tacypnea Patient did tolerate procedure well. Bilateral Breath Sounds: Diminished   Yes   Patient extubated per MD order.  Prior to extubation, MD was made aware of respiratory rate in the 40s and heart rate in the 130s.  Patient was extremely agitated.  Order was given to extubate patient due to not sure if agitation level was contributed to the ETT.  After extubated, patient continue to remain agitated and increased heart rate and respiratory rate and started to become diaphoretic.  Patient was reintubated by MD without complications.  Will continue to monitor.   Durwin GlazeBrown, Coty Larsh N 12/27/2016, 3:27 PM

## 2016-12-27 NOTE — Procedures (Signed)
Central Venous Catheter Insertion Procedure Note Izora GalaKay C Fare [id number] January 03, 1946  Procedure: Insertion of Central Venous Catheter Indications: Drug and/or fluid administration  Procedure Details Consent: Unable to obtain consent because of emergent medical necessity. Time Out: Verified patient identification, verified procedure, site/side was marked, verified correct patient position, special equipment/implants available, medications/allergies/relevent history reviewed, required imaging and test results available.  Performed  Maximum sterile technique was used including antiseptics, cap, gloves, gown, hand hygiene, mask and sheet. Skin prep: Chlorhexidine; local anesthetic administered A antimicrobial bonded/coated triple lumen catheter was placed in the left internal jugular vein using the Seldinger technique.  Evaluation Blood flow good Complications: No apparent complications Patient did tolerate procedure well. Chest X-ray ordered to verify placement.  CXR: pending.  YACOUB,WESAM 12/27/2016, 3:01 PM

## 2016-12-28 ENCOUNTER — Inpatient Hospital Stay (HOSPITAL_COMMUNITY): Payer: PPO

## 2016-12-28 DIAGNOSIS — R569 Unspecified convulsions: Secondary | ICD-10-CM

## 2016-12-28 DIAGNOSIS — Z4659 Encounter for fitting and adjustment of other gastrointestinal appliance and device: Secondary | ICD-10-CM

## 2016-12-28 DIAGNOSIS — Z452 Encounter for adjustment and management of vascular access device: Secondary | ICD-10-CM

## 2016-12-28 LAB — URINALYSIS, ROUTINE W REFLEX MICROSCOPIC
BILIRUBIN URINE: NEGATIVE
GLUCOSE, UA: NEGATIVE mg/dL
KETONES UR: NEGATIVE mg/dL
Nitrite: NEGATIVE
PH: 5 (ref 5.0–8.0)
Protein, ur: 100 mg/dL — AB
Specific Gravity, Urine: 1.024 (ref 1.005–1.030)

## 2016-12-28 LAB — BLOOD GAS, ARTERIAL
Acid-base deficit: 0.3 mmol/L (ref 0.0–2.0)
BICARBONATE: 23.4 mmol/L (ref 20.0–28.0)
Drawn by: 418751
FIO2: 40
LHR: 18 {breaths}/min
O2 Saturation: 98.3 %
PATIENT TEMPERATURE: 98.6
PEEP: 10 cmH2O
VT: 460 mL
pCO2 arterial: 35.6 mmHg (ref 32.0–48.0)
pH, Arterial: 7.433 (ref 7.350–7.450)
pO2, Arterial: 111 mmHg — ABNORMAL HIGH (ref 83.0–108.0)

## 2016-12-28 LAB — GLUCOSE, CAPILLARY
GLUCOSE-CAPILLARY: 200 mg/dL — AB (ref 65–99)
Glucose-Capillary: 194 mg/dL — ABNORMAL HIGH (ref 65–99)
Glucose-Capillary: 200 mg/dL — ABNORMAL HIGH (ref 65–99)
Glucose-Capillary: 215 mg/dL — ABNORMAL HIGH (ref 65–99)
Glucose-Capillary: 224 mg/dL — ABNORMAL HIGH (ref 65–99)
Glucose-Capillary: 208 mg/dL — ABNORMAL HIGH (ref 65–99)

## 2016-12-28 LAB — CBC
HCT: 34.4 % — ABNORMAL LOW (ref 36.0–46.0)
HEMOGLOBIN: 11.3 g/dL — AB (ref 12.0–15.0)
MCH: 28.8 pg (ref 26.0–34.0)
MCHC: 32.8 g/dL (ref 30.0–36.0)
MCV: 87.8 fL (ref 78.0–100.0)
PLATELETS: 179 10*3/uL (ref 150–400)
RBC: 3.92 MIL/uL (ref 3.87–5.11)
RDW: 14 % (ref 11.5–15.5)
WBC: 14.2 10*3/uL — ABNORMAL HIGH (ref 4.0–10.5)

## 2016-12-28 LAB — BASIC METABOLIC PANEL
Anion gap: 10 (ref 5–15)
BUN: 8 mg/dL (ref 6–20)
CALCIUM: 8.5 mg/dL — AB (ref 8.9–10.3)
CHLORIDE: 105 mmol/L (ref 101–111)
CO2: 24 mmol/L (ref 22–32)
CREATININE: 0.83 mg/dL (ref 0.44–1.00)
Glucose, Bld: 213 mg/dL — ABNORMAL HIGH (ref 65–99)
Potassium: 3 mmol/L — ABNORMAL LOW (ref 3.5–5.1)
SODIUM: 139 mmol/L (ref 135–145)

## 2016-12-28 LAB — HEMOGLOBIN A1C
HEMOGLOBIN A1C: 9.1 % — AB (ref 4.8–5.6)
MEAN PLASMA GLUCOSE: 214 mg/dL

## 2016-12-28 LAB — PHOSPHORUS
PHOSPHORUS: 2.8 mg/dL (ref 2.5–4.6)
Phosphorus: 2.4 mg/dL — ABNORMAL LOW (ref 2.5–4.6)

## 2016-12-28 LAB — MAGNESIUM
Magnesium: 1.7 mg/dL (ref 1.7–2.4)
Magnesium: 1.7 mg/dL (ref 1.7–2.4)

## 2016-12-28 LAB — HIV ANTIBODY (ROUTINE TESTING W REFLEX): HIV Screen 4th Generation wRfx: NONREACTIVE

## 2016-12-28 LAB — HEPATITIS PANEL, ACUTE
HCV Ab: <0.1 s/co ratio (ref 0.0–0.9)
Hep A IgM: NEGATIVE
Hep B C IgM: NEGATIVE
Hepatitis B Surface Ag: NEGATIVE

## 2016-12-28 LAB — TROPONIN I: Troponin I: 0.43 ng/mL (ref <0.03)

## 2016-12-28 LAB — PROCALCITONIN: PROCALCITONIN: 1.63 ng/mL

## 2016-12-28 LAB — LACTIC ACID, PLASMA: LACTIC ACID, VENOUS: 1.5 mmol/L (ref 0.5–1.9)

## 2016-12-28 MED ORDER — LORAZEPAM 2 MG/ML IJ SOLN
INTRAMUSCULAR | Status: AC
  Administered 2016-12-28: 1 mg via INTRAVENOUS
  Filled 2016-12-28: qty 1

## 2016-12-28 MED ORDER — VITAL HIGH PROTEIN PO LIQD
1000.0000 mL | ORAL | Status: DC
  Administered 2016-12-28: 20:00:00
  Administered 2016-12-29: 1000 mL
  Administered 2016-12-29: 09:00:00
  Administered 2016-12-30: 1000 mL

## 2016-12-28 MED ORDER — PRO-STAT SUGAR FREE PO LIQD
60.0000 mL | Freq: Three times a day (TID) | ORAL | Status: DC
  Administered 2016-12-28 – 2016-12-29 (×3): 60 mL
  Administered 2016-12-29: 30 mL
  Administered 2016-12-29 – 2016-12-30 (×4): 60 mL
  Filled 2016-12-28 (×9): qty 60

## 2016-12-28 MED ORDER — MUPIROCIN 2 % EX OINT
1.0000 "application " | TOPICAL_OINTMENT | Freq: Two times a day (BID) | CUTANEOUS | Status: AC
  Administered 2016-12-28 – 2017-01-01 (×10): 1 via NASAL
  Filled 2016-12-28 (×2): qty 22

## 2016-12-28 MED ORDER — SODIUM CHLORIDE 0.9 % IV BOLUS (SEPSIS)
1000.0000 mL | Freq: Once | INTRAVENOUS | Status: AC
  Administered 2016-12-28: 1000 mL via INTRAVENOUS

## 2016-12-28 MED ORDER — LABETALOL HCL 5 MG/ML IV SOLN
INTRAVENOUS | Status: AC
  Filled 2016-12-28: qty 4

## 2016-12-28 MED ORDER — LEVETIRACETAM 500 MG/5ML IV SOLN
500.0000 mg | Freq: Two times a day (BID) | INTRAVENOUS | Status: DC
  Administered 2016-12-28 – 2017-01-04 (×14): 500 mg via INTRAVENOUS
  Filled 2016-12-28 (×15): qty 5

## 2016-12-28 MED ORDER — VITAL HIGH PROTEIN PO LIQD
1000.0000 mL | ORAL | Status: DC
  Administered 2016-12-28: 1000 mL

## 2016-12-28 MED ORDER — CHLORHEXIDINE GLUCONATE 0.12% ORAL RINSE (MEDLINE KIT)
15.0000 mL | Freq: Two times a day (BID) | OROMUCOSAL | Status: DC
  Administered 2016-12-28 – 2017-01-03 (×12): 15 mL via OROMUCOSAL

## 2016-12-28 MED ORDER — DEXTROSE 5 % IV SOLN
1.0000 g | INTRAVENOUS | Status: DC
  Administered 2016-12-28 – 2016-12-30 (×3): 1 g via INTRAVENOUS
  Filled 2016-12-28 (×4): qty 10

## 2016-12-28 MED ORDER — FENTANYL CITRATE (PF) 100 MCG/2ML IJ SOLN
100.0000 ug | INTRAMUSCULAR | Status: DC | PRN
  Administered 2016-12-28 – 2016-12-29 (×13): 100 ug via INTRAVENOUS
  Filled 2016-12-28 (×13): qty 2

## 2016-12-28 MED ORDER — INSULIN GLARGINE 100 UNIT/ML ~~LOC~~ SOLN
8.0000 [IU] | Freq: Every day | SUBCUTANEOUS | Status: DC
  Administered 2016-12-28 – 2016-12-29 (×2): 8 [IU] via SUBCUTANEOUS
  Filled 2016-12-28 (×2): qty 0.08

## 2016-12-28 MED ORDER — POTASSIUM CHLORIDE 20 MEQ/15ML (10%) PO SOLN
40.0000 meq | ORAL | Status: AC
  Administered 2016-12-28 (×2): 40 meq
  Filled 2016-12-28 (×2): qty 30

## 2016-12-28 MED ORDER — ADULT MULTIVITAMIN LIQUID CH
15.0000 mL | Freq: Every day | ORAL | Status: DC
  Filled 2016-12-28: qty 15

## 2016-12-28 MED ORDER — ORAL CARE MOUTH RINSE
15.0000 mL | OROMUCOSAL | Status: DC
  Administered 2016-12-28 – 2017-01-03 (×49): 15 mL via OROMUCOSAL

## 2016-12-28 MED ORDER — CHLORHEXIDINE GLUCONATE CLOTH 2 % EX PADS
6.0000 | MEDICATED_PAD | Freq: Every day | CUTANEOUS | Status: AC
  Administered 2016-12-28 – 2017-01-01 (×5): 6 via TOPICAL

## 2016-12-28 MED ORDER — PRO-STAT SUGAR FREE PO LIQD
30.0000 mL | Freq: Two times a day (BID) | ORAL | Status: DC
  Administered 2016-12-28: 30 mL
  Filled 2016-12-28: qty 30

## 2016-12-28 NOTE — Progress Notes (Signed)
Subjective: She was extubated yesterday, but reintubated due to pulmonary reasons.  I was able to speak with her husband today, and it appears that the shaking was not 30 seconds but really 3-4 minutes which makes myoclonic syncope considerably less likely.  Exam: Vitals:   12/28/16 1432 12/28/16 1545  BP: (!) 142/68 129/72  Pulse: 76 77  Resp: 17 15  Temp:     Gen: In bed, NAD Resp: non-labored breathing, no acute distress Abd: soft, nt  She is currently sedated, but she briskly withdraws all extremities to noxious stimuli,? Right arm withdraws less than left  Pertinent Labs: Leukocytosis  Impression: 71 year old female with new onset seizures. She appeared to improve, however unfortunately did not tolerate extubation. I do think MRI is still necessary. With a new information on duration of the episode as well as the fact that she is now on remain intubated at least overnight tonight, I do think starting empiric therapy is indicated.  Recommendations: 1) MRI brain 2) Keppra 500 mg twice a day 3) neurology will continue to follow.  Ritta SlotMcNeill Chandra Asher, MD [facility] [phone]  If 7pm- 7am, please page neurology on call as listed in AMION.

## 2016-12-28 NOTE — Progress Notes (Signed)
PULMONARY / CRITICAL CARE MEDICINE   Name: CHERRE KOTHARI MRN: [id number] DOB: September 17, 1946    ADMISSION DATE:  12/27/2016 CONSULTATION DATE:  12/27/16  REFERRING MD:  Maryruth Bun   CHIEF COMPLAINT:  Seizures  HISTORY OF PRESENT ILLNESS:  Pt is encephelopathic; therefore, this HPI is obtained from chart review. AVANNAH DECKER is a 71 y.o. female with PMH as outlined below. She was taken to [facility] 01/29 for seizures.  Per her husband, she was doing well earlier that morning and was watching TV with him when she suddenly gasped, had eyes roll back, arms flex, and turned blue.  He initiated CPR although no clear answer whether she lost pulses.  EMS arrived and pt was altered then became responsive after sternal rub before becoming combative.  In ED, she remained combative and was intubated for airway protection.  She was then transferred to [facility] for evaluation by neuro.  SUBJECTIVE:  Re intubated yesterday afternoon, peep 10 noted  VITAL SIGNS: BP (!) 123/56   Pulse 79   Temp 98.7 F (37.1 C) (Axillary)   Resp 18   Ht 5' (1.524 m)   Wt 103 kg (227 lb 1.2 oz)   SpO2 100%   BMI 44.35 kg/m   HEMODYNAMICS:    VENTILATOR SETTINGS: Vent Mode: PRVC FiO2 (%):  [40 %-100 %] 40 % Set Rate:  [15 bmp-18 bmp] 18 bmp Vt Set:  [460 mL] 460 mL PEEP:  [5 cmH20-10 cmH20] 10 cmH20 Plateau Pressure:  [19 cmH20-25 cmH20] 25 cmH20  INTAKE / OUTPUT: I/O last 3 completed shifts: In: 2240.8 [I.V.:2240.8] Out: 910 [Urine:910]   PHYSICAL EXAMINATION: General: Middle aged female, in NAD. Neuro: Sedated rass -2, min FC now, moves upper ext  HEENT: [place]/AT. PERRL, sclerae anicteric. Cardiovascular: RRR, no M/R/G.  Lungs: CTA reduced Abdomen: BS x 4, soft, NT/ND.  Musculoskeletal: No gross deformities, no edema.  Skin: Intact, warm, no rashes.  LABS:  BMET  Recent Labs Lab 12/27/16 1301 12/28/16 0452  NA 137 139  K 4.4 3.0*  CL 99* 105  CO2 24 24  BUN 7 8  CREATININE 0.80  0.83  GLUCOSE 368* 213*    Electrolytes  Recent Labs Lab 12/27/16 1300 12/27/16 1301 12/28/16 0452  CALCIUM  --  8.7* 8.5*  MG 1.8 1.9 1.7  PHOS 3.5 4.6 2.8    CBC  Recent Labs Lab 12/27/16 1301 12/28/16 0452  WBC 15.4* 14.2*  HGB 12.7 11.3*  HCT 38.5 34.4*  PLT 194 179    Coag's  Recent Labs Lab 12/27/16 1301  INR 1.11    Sepsis Markers  Recent Labs Lab 12/27/16 1301 12/27/16 1833 12/27/16 2110 12/27/16 2300 12/28/16 0452  LATICACIDVEN 2.4* 3.1*  --  1.9 1.5  PROCALCITON 1.14  --  1.71  --  1.63    ABG  Recent Labs Lab 12/27/16 1640 12/28/16 0410  PHART 7.369 7.433  PCO2ART 44.1 35.6  PO2ART 234* 111*    Liver Enzymes  Recent Labs Lab 12/27/16 1301  AST 46*  ALT 20  ALKPHOS 69  BILITOT 1.3*  ALBUMIN 3.1*    Cardiac Enzymes  Recent Labs Lab 12/27/16 1301 12/27/16 1825 12/28/16 0101  TROPONINI 0.27* 0.47* 0.43*    Glucose  Recent Labs Lab 12/27/16 1558 12/27/16 1938 12/27/16 2347 12/28/16 0328 12/28/16 0807  GLUCAP 362* 299* 267* 194* 200*    Imaging Dg Chest Port 1 View  Result Date: 12/28/2016 CLINICAL DATA:  Respiratory failure. EXAM: PORTABLE CHEST 1 VIEW  COMPARISON:  12/27/2016 . FINDINGS: Endotracheal tube, left IJ line, NG tube in stable position. Cardiomegaly with normal pulmonary vascularity. No focal infiltrate. Interim clearing of bibasilar atelectasis. No pleural effusion or pneumothorax. Prior cervical spine fusion . IMPRESSION: 1. Lines and tubes in stable position. 2. Cardiomegaly, no evidence of congestive heart failure. 3. No focal pulmonary infiltrate. Interim clearing of bibasilar atelectasis. Electronically Signed   By: Maisie Fus  Register   On: 12/28/2016 08:31   Dg Chest Port 1 View  Result Date: 12/27/2016 CLINICAL DATA:  Central line placement. EXAM: PORTABLE CHEST 1 VIEW COMPARISON:  CT scan and radiograph of July 28, 2015. FINDINGS: Stable cardiomediastinal silhouette. Endotracheal tube is  seen projected over tracheal air shadow with distal tip 2.5 cm above the carina. Nasogastric tube is seen entering stomach. Left internal jugular catheter is noted with distal tip in expected position of the SVC. No pneumothorax or pleural effusion is noted. Mild bibasilar subsegmental atelectasis is noted. Bony thorax is unremarkable. IMPRESSION: Endotracheal and nasogastric tubes in grossly good position. Left internal jugular catheter is noted with distal tip in expected position of the SVC. No pneumothorax is noted. Mild bibasilar subsegmental atelectasis. Electronically Signed   By: Lupita Raider, M.D.   On: 12/27/2016 16:05   Dg Abd Portable 1v  Result Date: 12/27/2016 CLINICAL DATA:  71 year old female status post central line placement. Orogastric tube placement. EXAM: PORTABLE ABDOMEN - 1 VIEW COMPARISON:  Chest x-ray 07/28/2015. FINDINGS: An endotracheal tube is in place with tip 2.0 cm above the carina. Left internal jugular central venous catheter with tip terminating in the superior cavoatrial junction. Enteric tube extending into the antral pre-pyloric region of the stomach or proximal duodenum. Lung volumes are low. Ill-defined bibasilar opacities may reflect areas of atelectasis and/or airspace consolidation. No pleural effusions. No evidence of pulmonary edema. Heart size is borderline enlarged. The patient is rotated to the left on today's exam, resulting in distortion of the mediastinal contours and reduced diagnostic sensitivity and specificity for mediastinal pathology. IMPRESSION: 1. Support apparatus, as above. 2. Low lung volumes with increasing bibasilar opacities which may reflect areas of atelectasis and/or airspace consolidation. Electronically Signed   By: Trudie Reed M.D.   On: 12/27/2016 16:03     STUDIES:  CT head 01/29 (OSH) > no acute process. CXR 01/29 > no acute process.  CULTURES: None.  ANTIBIOTICS: None.  SIGNIFICANT EVENTS: 01/29 > transfer to  [facility]  LINES/TUBES: ETT 01/29 >  DISCUSSION: 71 y.o. female taken to [facility] 01/29 for seizures.  Required intubation in ED then transferred to [facility] for neuro evaluation.  ASSESSMENT / PLAN:  NEUROLOGIC A:   Acute encephalopathy - ? Seizures / status. Hx stroke neuropathy, bulging disks, fibromyalgia, anxiety, depression, right eye bells palsy, chronic pain, right foot drop. P:   Sedation:  Propofol gtt / Fentanyl PRN RASS goal: -1  WUA. EEG reviewed Needs MRI still Hold preadmission baclofen, citalopram, duloxetine, fentanyl patch, gabapentin, hydroxyzine, lorazepam, oxycodone.  PULMONARY A: VDRF - due to concerns for airway protection in setting of seizures / possible status.  Now stable and following all commands. P:   460, rate 18, peep 10, 30%, to goal peep 5, if we are NOT able to get to peep 5, would have concnerns that pcxr does not explain hypoxia and may need additional work up PE etc Rate redcution to 14 Peep to 5 as goal, sat goal 92% If able then cpap 5 ps5-10  CARDIOVASCULAR A:  Hx HTN, CAD.  P:  Monito tele Pos balance , lactic is NOT sepsis, is from seziures Continue preadmission ASA. Hold preadmission furosemide, HCTZ, lisinopril, metoprolol tartrate.  RENAL A:   AGMA - due to lactate- resolved  hypoK  P:   NS @ 125, maintain  Correct electrolytes as indicated. BMP in AM. Repeat UA- UTI k supp  GASTROINTESTINAL A:   GI prophylaxis. Nutrition. P:   SUP: Pantoprazole. NPO. Start TF  HEMATOLOGIC A:   VTE Prophylaxis. P:  SCD's / heparin. CBC in AM.  INFECTIOUS A:   UTI P:   Add ceftraixone Send cultuture to follow  ENDOCRINE A:   DM hyperglcyemia P:   SSI. Add lantus when TF started Hold preadmission glipizide, humulin, novolog.   Family updated: I updated husband  Interdisciplinary Family Meeting v Palliative Care Meeting:  Due by: 02/04.  CC time: 35 minutes.  Mcarthur Rossettianiel J. Tyson AliasFeinstein, MD, FACP Pgr: [phone] [place]  Pulmonary & Critical Care

## 2016-12-28 NOTE — Progress Notes (Signed)
Initial Nutrition Assessment  DOCUMENTATION CODES:   Morbid obesity  INTERVENTION:   Vital High Protein @ 5 ml/hr (120 ml/day) 60 ml Prostat TID MVI dailiy  Provides: 720 kcal, 100 grams protein, and 100 ml H2O.  TF regimen and propofol at current rate providing 1498 total kcal/day   NUTRITION DIAGNOSIS:   Inadequate oral intake related to inability to eat as evidenced by NPO status.  GOAL:   Provide needs based on ASPEN/SCCM guidelines  MONITOR:   TF tolerance, Vent status, I & O's  REASON FOR ASSESSMENT:   Consult Enteral/tube feeding initiation and management  ASSESSMENT:   Pt with PMH of DM, anxiety, depression, fibromyalgia, stroke, chronic pain admitted from home [facility] for seizures, required intubation.    Out of room for MRI.  Patient is currently intubated on ventilator support  Propofol: 29.5 ml/hr provides: 778 kcal per day Lantus started K+ 3.0 CBG's: 200-215  Diet Order:  Diet NPO time specified  Skin:  Reviewed, no issues  Last BM:  unknown  Height:   Ht Readings from Last 1 Encounters:  12/27/16 5' (1.524 m)    Weight:   Wt Readings from Last 1 Encounters:  12/28/16 227 lb 1.2 oz (103 kg)    Ideal Body Weight:     BMI:  Body mass index is 44.35 kg/m.  Estimated Nutritional Needs:   Kcal:  1610-96041082-1377  Protein:  >/= 113 grams   Fluid:  > 1.5 L/day  EDUCATION NEEDS:   No education needs identified at this time  Kendell BaneHeather Gwendlyon Zumbro RD, LDN, CNSC [phone] Pager [phone] After Hours Pager

## 2016-12-28 NOTE — Progress Notes (Signed)
Patient transported to MRI and back to room 3M11 without any apparent complications.

## 2016-12-28 NOTE — Care Management Note (Signed)
Case Management Note  Patient Details  Name: Virginia [name] MRN: [id number] Date of Birth: 1946/02/26  Subjective/Objective:  Pt [facility] 12/27/16 [facility] with [facility].  PTA, pt independent of ADLS; lives with spouse.                   Action/Plan: Pt currently remains intubated; will follow for discharge planning as pt progresses.    Expected Discharge Date:                  Expected Discharge Plan:  IP Rehab Facility  In-House Referral:     Discharge planning Services  CM Consult  Post Acute Care Choice:    Choice offered to:     DME Arranged:    DME Agency:     HH Arranged:    HH Agency:     Status of Service:  In process, will continue to follow  If discussed at Long Length of Stay Meetings, dates discussed:    Additional Comments:  Quintella BatonJulie W. Lamya Lausch, RN, BSN  Trauma/Neuro ICU Case Manager [phone]

## 2016-12-28 NOTE — Progress Notes (Signed)
Inpatient Diabetes Program Recommendations  AACE/ADA: New Consensus Statement on Inpatient Glycemic Control (2015)  Target Ranges:  Prepandial:   less than 140 mg/dL      Peak postprandial:   less than 180 mg/dL (1-2 hours)      Critically ill patients:  140 - 180 mg/dL   Results for Virginia [name], Virginia [name] (MRN [id number]) as of 12/28/2016 08:44  Ref. Range 12/27/2016 18:34  Hemoglobin A1C Latest Ref Range: 4.8 - 5.6 % 9.1 (H)   Results for Virginia [name], Virginia [name] (MRN [id number]) as of 12/28/2016 08:44  Ref. Range 12/27/2016 15:58 12/27/2016 19:38 12/27/2016 23:47 12/28/2016 03:28 12/28/2016 08:07  Glucose-Capillary Latest Ref Range: 65 - 99 mg/dL 086362 (H) 578299 (H) 469267 (H) 194 (H) 200 (H)    Admit with: Seizures  History: DM, CVA  Home DM Meds: NPH Insulin 60 units BID       Novolog 1-10 units TID       Glipizide 10 mg BID  Current Insulin Orders: Novolog Resistant Correction Scale/ SSI (0-20 units) Q4 hours       MD- Please consider the following in-hospital insulin adjustments:  Consider starting Lantus 20 units daily (0.2 units/kg dosing based on weight of 103 kg)  OR  Please consider initiating ICU Glycemic Control Protocol     --Will follow patient during hospitalization--  Ambrose FinlandJeannine Johnston Konnie Noffsinger RN, MSN, CDE Diabetes Coordinator Inpatient Glycemic Control Team Team Pager: [phone] (8a-5p)

## 2016-12-29 ENCOUNTER — Inpatient Hospital Stay (HOSPITAL_COMMUNITY): Payer: PPO

## 2016-12-29 LAB — GLUCOSE, CAPILLARY
Glucose-Capillary: 179 mg/dL — ABNORMAL HIGH (ref 65–99)
Glucose-Capillary: 144 mg/dL — ABNORMAL HIGH (ref 65–99)
Glucose-Capillary: 199 mg/dL — ABNORMAL HIGH (ref 65–99)
Glucose-Capillary: 191 mg/dL — ABNORMAL HIGH (ref 65–99)

## 2016-12-29 LAB — PHOSPHORUS
PHOSPHORUS: 3.5 mg/dL (ref 2.5–4.6)
Phosphorus: 2.4 mg/dL — ABNORMAL LOW (ref 2.5–4.6)

## 2016-12-29 LAB — BASIC METABOLIC PANEL
ANION GAP: 6 (ref 5–15)
BUN: 11 mg/dL (ref 6–20)
CHLORIDE: 113 mmol/L — AB (ref 101–111)
CO2: 23 mmol/L (ref 22–32)
Calcium: 8.2 mg/dL — ABNORMAL LOW (ref 8.9–10.3)
Creatinine, Ser: 0.58 mg/dL (ref 0.44–1.00)
GFR calc Af Amer: >60 mL/min (ref >60)
GFR calc non Af Amer: >60 mL/min (ref >60)
GLUCOSE: 235 mg/dL — AB (ref 65–99)
POTASSIUM: 3.1 mmol/L — AB (ref 3.5–5.1)
Sodium: 142 mmol/L (ref 135–145)

## 2016-12-29 LAB — PROCALCITONIN: Procalcitonin: 0.5 ng/mL

## 2016-12-29 LAB — URINE CULTURE

## 2016-12-29 LAB — MAGNESIUM
MAGNESIUM: 2.2 mg/dL (ref 1.7–2.4)
Magnesium: 1.6 mg/dL — ABNORMAL LOW (ref 1.7–2.4)

## 2016-12-29 MED ORDER — ADULT MULTIVITAMIN W/MINERALS CH
1.0000 | ORAL_TABLET | Freq: Every day | ORAL | Status: DC
  Administered 2016-12-29 – 2017-01-04 (×6): 1
  Filled 2016-12-29 (×7): qty 1

## 2016-12-29 MED ORDER — FUROSEMIDE 10 MG/ML IJ SOLN
40.0000 mg | Freq: Two times a day (BID) | INTRAMUSCULAR | Status: AC
  Administered 2016-12-29 (×2): 40 mg via INTRAVENOUS
  Filled 2016-12-29 (×2): qty 4

## 2016-12-29 MED ORDER — FENTANYL BOLUS VIA INFUSION
25.0000 ug | INTRAVENOUS | Status: DC | PRN
  Filled 2016-12-29: qty 25

## 2016-12-29 MED ORDER — DULOXETINE HCL 60 MG PO CPEP
60.0000 mg | ORAL_CAPSULE | Freq: Every day | ORAL | Status: DC
  Administered 2016-12-29 – 2016-12-30 (×2): 60 mg via ORAL
  Filled 2016-12-29 (×2): qty 1

## 2016-12-29 MED ORDER — SODIUM CHLORIDE 0.9 % IV SOLN
25.0000 ug/h | INTRAVENOUS | Status: DC
  Administered 2016-12-29: 50 ug/h via INTRAVENOUS
  Filled 2016-12-29: qty 50

## 2016-12-29 MED ORDER — POTASSIUM CHLORIDE 20 MEQ/15ML (10%) PO SOLN
40.0000 meq | Freq: Four times a day (QID) | ORAL | Status: AC
  Administered 2016-12-29 (×2): 40 meq
  Filled 2016-12-29 (×2): qty 30

## 2016-12-29 MED ORDER — LORAZEPAM 1 MG PO TABS
1.0000 mg | ORAL_TABLET | Freq: Three times a day (TID) | ORAL | Status: DC
  Administered 2016-12-29 – 2016-12-30 (×5): 1 mg via ORAL
  Filled 2016-12-29 (×5): qty 1

## 2016-12-29 MED ORDER — METOPROLOL TARTRATE 25 MG/10 ML ORAL SUSPENSION
25.0000 mg | Freq: Two times a day (BID) | ORAL | Status: DC
  Administered 2016-12-29 – 2017-01-02 (×10): 25 mg
  Filled 2016-12-29 (×11): qty 10

## 2016-12-29 MED ORDER — INSULIN GLARGINE 100 UNIT/ML ~~LOC~~ SOLN
10.0000 [IU] | Freq: Every day | SUBCUTANEOUS | Status: DC
  Administered 2016-12-30: 10 [IU] via SUBCUTANEOUS
  Filled 2016-12-29 (×2): qty 0.1

## 2016-12-29 MED ORDER — FENTANYL CITRATE (PF) 100 MCG/2ML IJ SOLN
50.0000 ug | Freq: Once | INTRAMUSCULAR | Status: AC
  Administered 2016-12-29: 50 ug via INTRAVENOUS

## 2016-12-29 NOTE — Progress Notes (Signed)
Subjective: No further seizures.   Exam: Vitals:   12/29/16 1100 12/29/16 1145  BP: (!) 175/89   Pulse: 79 77  Resp: (!) 24 (!) 28  Temp:     Gen: In bed, NAD Resp: non-labored breathing, no acute distress Abd: soft, nt  Awake, alert, follows commands briskly. Engages examiner  And tracks across midline in both directions. Follows commands in all 4 extremities.   MRI reviewed with no acute findings.   Impression: 71 year old female with new onset seizures.  She does take benzodiazepines at home, and once extubated, may need to resume these. Ok with holding sedating meds while weaning, but would favor restarting duloxetine as it is not as sedating. She has "early alzheimers" per the husband and with new onset seizure in the setting of dementia, I do favor continued therapy.   Recommendations: 1) Keppra 500 mg twice a day 2) restart duloxetine 3) Consider restarting benzodiazepine if she was taking it regularly.  4) consider restarting baclofen as well.   Ritta SlotMcNeill Kirkpatrick, MD [facility] [phone]  If 7pm- 7am, please page neurology on call as listed in AMION.

## 2016-12-29 NOTE — Progress Notes (Signed)
Patients husband notified regarding the transfer to 37M.

## 2016-12-29 NOTE — Progress Notes (Signed)
eLink Physician-Brief Progress Note Patient Name: Virginia [name] DOB: 03/05/1946 MRN: [id number]   Date of Service  12/29/2016  HPI/Events of Note  Increased fentanyl usage  eICU Interventions  Start fentanyl infusion     Intervention Category Intermediate Interventions: Pain - evaluation and management  Erin FullingKurian Numan Zylstra 12/29/2016, 10:41 PM

## 2016-12-29 NOTE — Progress Notes (Addendum)
PULMONARY / CRITICAL CARE MEDICINE   Name: Virginia [name] MRN: [id number] DOB: 1946-03-26    ADMISSION DATE:  12/27/2016 CONSULTATION DATE:  12/27/16  REFERRING MD:  Maryruth Bun   CHIEF COMPLAINT:  Seizures  HISTORY OF PRESENT ILLNESS:  Virginia [name] is a 71 y.o. female with PMH as outlined below. She was taken to [facility] 01/29 for seizures.  Per her husband, she was doing well earlier that morning and was watching TV with him when she suddenly gasped, had eyes roll back, arms flex, and turned blue.  He initiated CPR although no clear answer whether she lost pulses.  EMS arrived and pt was altered then became responsive after sternal rub before becoming combative.  In ED, she remained combative and was intubated for airway protection.  She was then transferred to [facility] for evaluation by neuro.  SUBJECTIVE:   No sig change overnight.  Had f/u MRI yesterday afternoon.  Tol PS 10/5 but gets tachypneic, increased WOB with PS 5/5.   VITAL SIGNS: BP (!) 181/83   Pulse 78   Temp 98.3 F (36.8 C) (Axillary)   Resp (!) 24   Ht 5' (1.524 m)   Wt 103.3 kg (227 lb 11.8 oz)   SpO2 99%   BMI 44.48 kg/m   HEMODYNAMICS:    VENTILATOR SETTINGS: Vent Mode: PSV;CPAP FiO2 (%):  [30 %-100 %] 30 % Set Rate:  [14 bmp] 14 bmp Vt Set:  [460 mL] 460 mL PEEP:  [5 cmH20] 5 cmH20 Pressure Support:  [10 cmH20] 10 cmH20 Plateau Pressure:  [17 cmH20-20 cmH20] 19 cmH20  INTAKE / OUTPUT: I/O last 3 completed shifts: In: 5816.5 [P.O.:1; I.V.:5424.2; Other:10; NG/GT:126.3; IV Piggyback:255] Out: 1335 [Urine:960; Stool:375]   PHYSICAL EXAMINATION: General:  Chronically ill appearing female, NAD on vent  HEENT: MM pink/moist, ETT Neuro:  Awake, slow to respond but appropriate, follows commands  CV: s1s2 rrr, no m/r/g PULM: even/non-labored on PS 10/5, does not tol PS 5/5 r/t tachypnea, essentially clear  MW:NUUV, non-tender, bsx4 active  Extremities: warm/dry, scant BLE edema  Skin: no  rashes or lesions    LABS:  BMET  Recent Labs Lab 12/27/16 1301 12/28/16 0452  NA 137 139  K 4.4 3.0*  CL 99* 105  CO2 24 24  BUN 7 8  CREATININE 0.80 0.83  GLUCOSE 368* 213*    Electrolytes  Recent Labs Lab 12/27/16 1301 12/28/16 0452 12/28/16 2200 12/29/16 0506  CALCIUM 8.7* 8.5*  --   --   MG 1.9 1.7 1.7 2.2  PHOS 4.6 2.8 2.4* 3.5    CBC  Recent Labs Lab 12/27/16 1301 12/28/16 0452  WBC 15.4* 14.2*  HGB 12.7 11.3*  HCT 38.5 34.4*  PLT 194 179    Coag's  Recent Labs Lab 12/27/16 1301  INR 1.11    Sepsis Markers  Recent Labs Lab 12/27/16 1833 12/27/16 2110 12/27/16 2300 12/28/16 0452 12/29/16 0506  LATICACIDVEN 3.1*  --  1.9 1.5  --   PROCALCITON  --  1.71  --  1.63 0.50    ABG  Recent Labs Lab 12/27/16 1640 12/28/16 0410  PHART 7.369 7.433  PCO2ART 44.1 35.6  PO2ART 234* 111*    Liver Enzymes  Recent Labs Lab 12/27/16 1301  AST 46*  ALT 20  ALKPHOS 69  BILITOT 1.3*  ALBUMIN 3.1*    Cardiac Enzymes  Recent Labs Lab 12/27/16 1301 12/27/16 1825 12/28/16 0101  TROPONINI 0.27* 0.47* 0.43*    Glucose  Recent Labs  Lab 12/28/16 1127 12/28/16 1603 12/28/16 2008 12/28/16 2315 12/29/16 0323 12/29/16 0815  GLUCAP 215* 200* 224* 208* 179* 144*    Imaging Mr Brain Wo Contrast  Result Date: 12/28/2016 CLINICAL DATA:  [age] y/o  F; seizure. EXAM: MRI HEAD WITHOUT CONTRAST TECHNIQUE: Multiplanar, multiecho pulse sequences of the brain and surrounding structures were obtained without intravenous contrast. COMPARISON:  12/20/2013 MRI of the brain. FINDINGS: Brain: No diffusion signal abnormality. T2 FLAIR hyperintense signal abnormality in periventricular and subcortical white matter is nonspecific but probably represents moderate chronic microvascular ischemic changes and is mildly progressed. Mild progression of parenchymal volume loss. No focal mass effect. No extra-axial collection. Ventricle size is commensurate  degree of volume loss. Punctate foci of susceptibility hypointensity within the left parietal lobe and left cerebral peduncle likely represents hemosiderin deposition of remote microhemorrhage. No gross structural abnormality. No definite asymmetry in hippocampal size or signal. Vascular: Normal flow voids. Skull and upper cervical spine: Normal marrow signal. Hyperostosis frontalis interna. Sinuses/Orbits: Mild paranasal sinus disease predominantly ethmoid and sphenoid sinus probably due to intubation. No abnormal signal of mastoid air cells. Right intra-ocular lens replacement. Other: None. IMPRESSION: 1. Extensive motion artifact. 2. No gross structural abnormality as explanation for seizure. 3. No acute intracranial abnormality. 4. Mild progression and parenchymal volume loss and chronic microvascular ischemic changes of the brain. Electronically Signed   By: Mitzi HansenLance  Furusawa-Stratton M.D.   On: 12/28/2016 14:02     STUDIES:  CT head 01/29 (OSH) > no acute process. CXR 01/29 > no acute process. MR brain 1/31>>> 1. Extensive motion artifact.  2. No gross structural abnormality as explanation for seizure. 3. No acute intracranial abnormality.  4. Mild progression and parenchymal volume loss and chronic microvascular ischemic changes of the brain.  CULTURES: Urine 1/30>>>  ANTIBIOTICS: Rocephin 1/30>>>  SIGNIFICANT EVENTS: [facility]29 [facility]  LINES/TUBES: ETT 01/29 >1/29, 1/30>>>  DISCUSSION: 71 [facility]. [facility].  Required intubation in ED [facility].   ASSESSMENT / PLAN:  NEUROLOGIC A:   Acute encephalopathy - ? Seizures / status. Hx stroke neuropathy, bulging disks, fibromyalgia, anxiety, depression, right eye bells palsy, chronic pain, right foot drop. P:   RASS goal -1  Neuro following  Continue to hold preadmission baclofen, citalopram, duloxetine, fentanyl patch, gabapentin, hydroxyzine, lorazepam,  oxycodone. Propofol gtt, fentanyl PRN    PULMONARY A: Acute hypoxic respiratory failure - initially intubated for airway protection but failed extubation r/t hypoxia and increased WOB.  P:   Vent support - 8cc/kg  F/u CXR  F/u ABG Daily SBT  Hypoxia essentially resolved - hold off on further w/u for PE etc    CARDIOVASCULAR A:  Hx HTN, CAD. P:  Tele monitoring  Lactate resolved - r/t seizure NOT sepsis  Resume home metoprolol 25mg  BID  Continue to hold home hctz, lisinopril, lasix  Await CXR - may consider diuresis    RENAL A:   AGMA - due to lactate- resolved  hypoK  Possible UTI P:   Chem now and in am  KVO MIVF  Replete K PRN  abx as above    GASTROINTESTINAL A:   GI prophylaxis. Nutrition. P:   SUP: Pantoprazole. NPO. Continue TF  HEMATOLOGIC A:   VTE Prophylaxis. P:  SCD's / heparin. CBC in AM.  INFECTIOUS A:   UTI P:   Continue rocephin for probable uti  Await urine culture    ENDOCRINE A:   DM  hyperglcyemia P:   SSI. Increase lantus to 10 units daily 1/31 Hold home glipizide, humulin, novolog     Family updated:  Multiple family members updated at bedside 1/31  Interdisciplinary Family Meeting v Palliative Care Meeting:  Due by: 02/04.  CC time: 30 minutes.   Dirk Dress, NP 12/29/2016  10:04 AM Pager: [phone] or [phone]  STAFF NOTE: I, Rory Percy, MD FACP have personally reviewed patient's available data, including medical history, events of note, physical examination and test results as part of my evaluation. I have discussed with resident/NP and other care providers such as pharmacist, RN and RRT. In addition, I personally evaluated patient and elicited key findings of: awake, fc, lungs slight coarse, pcxr last neg infiltrate, we were successful to peep to 5, MRI poor images but negtaive, await further neuro recs, wean now PS 8-10 required secondary to lower lung volumes, repeat pcxr now to r/o pulm  edema with pos 4 liter last 48 hours, may need lasix, if pcxr remains neg may need to pursue work up PE and get echo also, pct down, WBC down, feeding, await remainder chem today for K as was low and replaced last 24 hours, assess bmet then dose lasix likely, family updated, repeat UA in am , culture neg, maintain ctx The patient is critically ill with multiple organ systems failure and requires high complexity decision making for assessment and support, frequent evaluation and titration of therapies, application of advanced monitoring technologies and extensive interpretation of multiple databases.   Critical Care Time devoted to patient care services described in this note is 30  Minutes. This time reflects time of care of this signee: Rory Percy, MD FACP. This critical care time does not reflect procedure time, or teaching time or supervisory time of PA/NP/Med student/Med Resident etc but could involve care discussion time. Rest per NP/medical resident whose note is outlined above and that I agree with   Mcarthur Rossetti. Tyson Alias, MD, FACP Pgr: [phone] [facility] 12/29/2016 10:10 AM

## 2016-12-30 DIAGNOSIS — J810 Acute pulmonary edema: Secondary | ICD-10-CM

## 2016-12-30 LAB — CBC
HEMATOCRIT: 31.8 % — AB (ref 36.0–46.0)
HEMOGLOBIN: 10 g/dL — AB (ref 12.0–15.0)
MCH: 28.3 pg (ref 26.0–34.0)
MCHC: 31.4 g/dL (ref 30.0–36.0)
MCV: 90.1 fL (ref 78.0–100.0)
Platelets: 158 10*3/uL (ref 150–400)
RBC: 3.53 MIL/uL — ABNORMAL LOW (ref 3.87–5.11)
RDW: 14.1 % (ref 11.5–15.5)
WBC: 8.6 10*3/uL (ref 4.0–10.5)

## 2016-12-30 LAB — BASIC METABOLIC PANEL
Anion gap: 6 (ref 5–15)
BUN: 15 mg/dL (ref 6–20)
CALCIUM: 8.2 mg/dL — AB (ref 8.9–10.3)
CO2: 27 mmol/L (ref 22–32)
CREATININE: 0.71 mg/dL (ref 0.44–1.00)
Chloride: 109 mmol/L (ref 101–111)
GFR calc Af Amer: >60 mL/min (ref >60)
GLUCOSE: 209 mg/dL — AB (ref 65–99)
Potassium: 3.3 mmol/L — ABNORMAL LOW (ref 3.5–5.1)
Sodium: 142 mmol/L (ref 135–145)

## 2016-12-30 LAB — GLUCOSE, CAPILLARY
GLUCOSE-CAPILLARY: 198 mg/dL — AB (ref 65–99)
GLUCOSE-CAPILLARY: 161 mg/dL — AB (ref 65–99)
Glucose-Capillary: 170 mg/dL — ABNORMAL HIGH (ref 65–99)
Glucose-Capillary: 203 mg/dL — ABNORMAL HIGH (ref 65–99)
Glucose-Capillary: 207 mg/dL — ABNORMAL HIGH (ref 65–99)
Glucose-Capillary: 213 mg/dL — ABNORMAL HIGH (ref 65–99)
Glucose-Capillary: 221 mg/dL — ABNORMAL HIGH (ref 65–99)

## 2016-12-30 LAB — TRIGLYCERIDES: TRIGLYCERIDES: 208 mg/dL — AB (ref <150)

## 2016-12-30 MED ORDER — FUROSEMIDE 10 MG/ML IJ SOLN
40.0000 mg | Freq: Two times a day (BID) | INTRAMUSCULAR | Status: AC
  Administered 2016-12-30 (×2): 40 mg via INTRAVENOUS
  Filled 2016-12-30 (×2): qty 4

## 2016-12-30 MED ORDER — BACLOFEN 1 MG/ML ORAL SUSPENSION
10.0000 mg | Freq: Three times a day (TID) | ORAL | Status: DC
  Administered 2016-12-30 (×3): 10 mg
  Filled 2016-12-30 (×4): qty 1

## 2016-12-30 MED ORDER — OXYCODONE HCL 5 MG PO TABS
10.0000 mg | ORAL_TABLET | Freq: Four times a day (QID) | ORAL | Status: DC
  Administered 2016-12-30 – 2016-12-31 (×5): 10 mg via ORAL
  Filled 2016-12-30 (×5): qty 2

## 2016-12-30 MED ORDER — CITALOPRAM HYDROBROMIDE 10 MG/5ML PO SOLN
20.0000 mg | Freq: Every day | ORAL | Status: DC
  Filled 2016-12-30: qty 10

## 2016-12-30 MED ORDER — MAGNESIUM SULFATE 2 GM/50ML IV SOLN
2.0000 g | Freq: Once | INTRAVENOUS | Status: AC
  Administered 2016-12-30: 2 g via INTRAVENOUS
  Filled 2016-12-30: qty 50

## 2016-12-30 MED ORDER — FENTANYL 75 MCG/HR TD PT72
75.0000 ug | MEDICATED_PATCH | TRANSDERMAL | Status: DC
  Administered 2016-12-30: 75 ug via TRANSDERMAL
  Filled 2016-12-30: qty 1

## 2016-12-30 MED ORDER — POTASSIUM CHLORIDE 20 MEQ/15ML (10%) PO SOLN
40.0000 meq | ORAL | Status: AC
  Administered 2016-12-30 (×3): 40 meq via ORAL
  Filled 2016-12-30 (×3): qty 30

## 2016-12-30 NOTE — Progress Notes (Signed)
Subjective: No further seizures.   Exam: Vitals:   12/30/16 0842 12/30/16 0900  BP: (!) 141/72 (!) 156/74  Pulse: 70 73  Resp: 20 (!) 23  Temp:     Gen: In bed, NAD Resp: non-labored breathing, no acute distress Abd: soft, nt  Awake, alert, follows commands briskly. Engages examiner  And tracks across midline in both directions. Follows commands in all 4 extremities.   Impression: [age] year old female with new onset seizures.  She does take benzodiazepines at home, and once no longer sedated may need to resume these. She has "early alzheimers" per the husband and with new onset seizure in the setting of dementia, I do favor continued therapy. Would restart   Recommendations: 1) Keppra 500 mg twice a day 2) restart duloxetine 3) Consider restarting benzodiazepine was taking 2mg  BID prior to admission 4) consider restarting baclofen as well.   Ritta SlotMcNeill Corbin Falck, MD [facility] [phone]  If 7pm- 7am, please page neurology on call as listed in AMION.

## 2016-12-30 NOTE — Progress Notes (Signed)
PULMONARY / CRITICAL CARE MEDICINE   Name: ANNALI LYBRAND MRN: [id number] DOB: [birth year]    ADMISSION DATE:  12/27/2016 CONSULTATION DATE:  12/27/16  REFERRING MD:  Maryruth Bun   CHIEF COMPLAINT:  Seizures  HISTORY OF PRESENT ILLNESS:  Virginia [name] is a 71 y.o. female with PMH as outlined below. She was taken to [facility] 01/29 for seizures.  Per her husband, she was doing well earlier that morning and was watching TV with him when she suddenly gasped, had eyes roll back, arms flex, and turned blue.  He initiated CPR although no clear answer whether she lost pulses.  EMS arrived and pt was altered then became responsive after sternal rub before becoming combative.  In ED, she remained combative and was intubated for airway protection.  She was then transferred to [facility] for evaluation by neuro.  SUBJECTIVE:    Remains tachypneic on PST on 1/31. Doing PST now and looks comfortable.     VITAL SIGNS: BP (!) 156/74   Pulse 73   Temp 98.4 F (36.9 C) (Oral)   Resp (!) 23   Ht 5' (1.524 m)   Wt 102.4 kg (225 lb 12 oz)   SpO2 95%   BMI 44.09 kg/m   HEMODYNAMICS:    VENTILATOR SETTINGS: Vent Mode: PSV;CPAP FiO2 (%):  [30 %] 30 % Set Rate:  [14 bmp] 14 bmp Vt Set:  [460 mL] 460 mL PEEP:  [5 cmH20] 5 cmH20 Pressure Support:  [5 cmH20] 5 cmH20 Plateau Pressure:  [16 cmH20-20 cmH20] 18 cmH20  INTAKE / OUTPUT: I/O last 3 completed shifts: In: 4733.8 [I.V.:2727.4; Other:1560; NG/GT:236.3; IV Piggyback:210] Out: 3840 [Urine:3315; Stool:525]   PHYSICAL EXAMINATION: General:  Chronically ill appearing female, NAD on vent. Comfortable.  HEENT: MM pink/moist, ETT Neuro:  Awake, slow to respond but appropriate, follows commands  CV: s1s2 rrr, no m/r/g PULM: good ae. Dec BS bibasilar. crackles at bases.  WU:JWJX, non-tender, bsx4 active  Extremities: warm/dry,  BLE edema  Skin: no rashes or lesions    LABS:  BMET  Recent Labs Lab 12/28/16 0452 12/29/16 1030  12/30/16 0418  NA 139 142 142  K 3.0* 3.1* 3.3*  CL 105 113* 109  CO2 24 23 27   BUN 8 11 15   CREATININE 0.83 0.58 0.71  GLUCOSE 213* 235* 209*    Electrolytes  Recent Labs Lab 12/28/16 0452 12/28/16 2200 12/29/16 0506 12/29/16 1030 12/29/16 1750 12/30/16 0418  CALCIUM 8.5*  --   --  8.2*  --  8.2*  MG 1.7 1.7 2.2  --  1.6*  --   PHOS 2.8 2.4* 3.5  --  2.4*  --     CBC  Recent Labs Lab 12/27/16 1301 12/28/16 0452 12/30/16 0418  WBC 15.4* 14.2* 8.6  HGB 12.7 11.3* 10.0*  HCT 38.5 34.4* 31.8*  PLT 194 179 158    Coag's  Recent Labs Lab 12/27/16 1301  INR 1.11    Sepsis Markers  Recent Labs Lab 12/27/16 1833 12/27/16 2110 12/27/16 2300 12/28/16 0452 12/29/16 0506  LATICACIDVEN 3.1*  --  1.9 1.5  --   PROCALCITON  --  1.71  --  1.63 0.50    ABG  Recent Labs Lab 12/27/16 1640 12/28/16 0410  PHART 7.369 7.433  PCO2ART 44.1 35.6  PO2ART 234* 111*    Liver Enzymes  Recent Labs Lab 12/27/16 1301  AST 46*  ALT 20  ALKPHOS 69  BILITOT 1.3*  ALBUMIN 3.1*    Cardiac Enzymes  Recent Labs Lab 12/27/16 1301 12/27/16 1825 12/28/16 0101  TROPONINI 0.27* 0.47* 0.43*    Glucose  Recent Labs Lab 12/29/16 1204 12/29/16 1552 12/29/16 1932 12/30/16 0045 12/30/16 0412 12/30/16 0748  GLUCAP 198* 199* 191* 213* 221* 170*    Imaging Dg Chest Portable 1 View  Result Date: 12/29/2016 CLINICAL DATA:  Intubated, seizure EXAM: PORTABLE CHEST 1 VIEW COMPARISON:  12/28/2016 FINDINGS: Borderline cardiomegaly. Endotracheal tube in place with tip 8 mm above the carina. NG tube in place. Stable left IJ central line. Central mild vascular congestion without convincing pulmonary edema. No segmental infiltrate IMPRESSION: Endotracheal tube in place with tip 8 mm above the carina. NG tube in place. Stable left IJ central line. Central mild vascular congestion without convincing pulmonary edema. No segmental infiltrate. Electronically Signed   By: Natasha MeadLiviu   Pop M.D.   On: 12/29/2016 10:49     STUDIES:  CT head 01/29 (OSH) > no acute process. CXR 01/29 > no acute process. MR brain 1/31>>> 1. Extensive motion artifact.  2. No gross structural abnormality as explanation for seizure. 3. No acute intracranial abnormality.  4. Mild progression and parenchymal volume loss and chronic microvascular ischemic changes of the brain.  CULTURES: Urine 1/30>>>  ANTIBIOTICS: Rocephin 1/30>>>  SIGNIFICANT EVENTS: [facility]  LINES/TUBES: ETT 01/29 >1/29, 1/30>>>  DISCUSSION: 71 [facility].o. [facility] for seizures.  Required intubation in ED [facility].   ASSESSMENT / PLAN:  NEUROLOGIC A:   Acute encephalopathy with seizures. Not sure etiology for sze.  Chronic Pain and anxiety Hx stroke neuropathy, bulging disks, fibromyalgia, anxiety, depression, right eye bells palsy, chronic pain, right foot drop. P:   RASS goal 0 - -1 Neuro following  Restart fentanyl patch 75 mcg q 72, oxycodone 10 mg 4x/d, baclofen.  Cymbalta to start once extubated. Hold off on gabapentin. Cont lorazepam 1 mg TID Wean off propofol.  Off fentanyl drip.  PULMONARY A: Acute hypoxic respiratory failure - initially intubated for airway protection but failed extubation r/t hypoxia and increased WOB.   Acute pulmonary edema Concern for aspiration PNA vs pneumonitis with sze.  P:   Vent support - 8cc/kg. PST trial.  Daily SBT  Needs diuresis Keep rocephin for now   CARDIOVASCULAR A:  Hx HTN, CAD. Pulmonary edema P:  Tele monitoring  cont metoprolol 25mg  BID  Lasix today. Correct K   RENAL A:   AGMA - due to lactate- resolved  Possible UTI Pulm edema P:   Diuresce. Replace K   GASTROINTESTINAL A:   GI prophylaxis. Nutrition. P:   SUP: Pantoprazole. Continue TF  HEMATOLOGIC A:   VTE Prophylaxis. P:  SCD's / heparin. CBC in AM.  INFECTIOUS A:   Asp PNA vs pneumonitis P:    Continue rocephin for now.    ENDOCRINE A:   DM hyperglcyemia P:   SSI. Cont  lantus  10 units daily  Hold home glipizide, humulin, novolog     Family updated:  Multiple family members updated at bedside 1/31. Family updated at bedside on 2/1  Interdisciplinary Family Meeting v Palliative Care Meeting:  Due by: 02/04.   I spent  30  minutes of Critical Care time with this patient today. This is my time spent independent of the APP or resident.   Pollie MeyerJ. Angelo A de Dios, MD 12/30/2016, 10:31 AM [facility] Pager [phone] After 3 pm or if no answer, call  [phone]     

## 2016-12-31 LAB — GLUCOSE, CAPILLARY
GLUCOSE-CAPILLARY: 188 mg/dL — AB (ref 65–99)
Glucose-Capillary: 177 mg/dL — ABNORMAL HIGH (ref 65–99)
Glucose-Capillary: 212 mg/dL — ABNORMAL HIGH (ref 65–99)
Glucose-Capillary: 204 mg/dL — ABNORMAL HIGH (ref 65–99)
Glucose-Capillary: 161 mg/dL — ABNORMAL HIGH (ref 65–99)
Glucose-Capillary: 200 mg/dL — ABNORMAL HIGH (ref 65–99)

## 2016-12-31 LAB — BASIC METABOLIC PANEL
Anion gap: 9 (ref 5–15)
BUN: 22 mg/dL — ABNORMAL HIGH (ref 6–20)
CALCIUM: 8.4 mg/dL — AB (ref 8.9–10.3)
CO2: 29 mmol/L (ref 22–32)
CREATININE: 0.65 mg/dL (ref 0.44–1.00)
Chloride: 105 mmol/L (ref 101–111)
Glucose, Bld: 191 mg/dL — ABNORMAL HIGH (ref 65–99)
Potassium: 3.8 mmol/L (ref 3.5–5.1)
SODIUM: 143 mmol/L (ref 135–145)

## 2016-12-31 LAB — CBC
HEMATOCRIT: 31.5 % — AB (ref 36.0–46.0)
Hemoglobin: 9.9 g/dL — ABNORMAL LOW (ref 12.0–15.0)
MCH: 28.7 pg (ref 26.0–34.0)
MCHC: 31.4 g/dL (ref 30.0–36.0)
MCV: 91.3 fL (ref 78.0–100.0)
Platelets: 175 10*3/uL (ref 150–400)
RBC: 3.45 MIL/uL — ABNORMAL LOW (ref 3.87–5.11)
RDW: 14.1 % (ref 11.5–15.5)
WBC: 7.5 10*3/uL (ref 4.0–10.5)

## 2016-12-31 LAB — PHOSPHORUS: PHOSPHORUS: 4.3 mg/dL (ref 2.5–4.6)

## 2016-12-31 LAB — MAGNESIUM: MAGNESIUM: 2.2 mg/dL (ref 1.7–2.4)

## 2016-12-31 MED ORDER — MIDAZOLAM HCL 2 MG/2ML IJ SOLN
1.0000 mg | INTRAMUSCULAR | Status: DC | PRN
  Administered 2016-12-31 – 2017-01-02 (×3): 1 mg via INTRAVENOUS
  Filled 2016-12-31 (×3): qty 2

## 2016-12-31 MED ORDER — VITAL HIGH PROTEIN PO LIQD: 1000.0000 mL | ORAL | Status: DC

## 2016-12-31 MED ORDER — MIDAZOLAM HCL 2 MG/2ML IJ SOLN
1.0000 mg | INTRAMUSCULAR | Status: AC | PRN
  Administered 2016-12-31 (×3): 1 mg via INTRAVENOUS
  Filled 2016-12-31 (×3): qty 2

## 2016-12-31 MED ORDER — LORAZEPAM 1 MG PO TABS
1.0000 mg | ORAL_TABLET | Freq: Two times a day (BID) | ORAL | Status: DC
  Administered 2016-12-31 – 2017-01-02 (×6): 1 mg via ORAL
  Filled 2016-12-31 (×7): qty 1

## 2016-12-31 MED ORDER — VITAL HIGH PROTEIN PO LIQD
1000.0000 mL | ORAL | Status: DC
  Administered 2016-12-31: 1000 mL

## 2016-12-31 MED ORDER — INSULIN GLARGINE 100 UNIT/ML ~~LOC~~ SOLN
20.0000 [IU] | Freq: Every day | SUBCUTANEOUS | Status: DC
Start: 2016-12-31 — End: 2017-01-01
  Administered 2016-12-31: 20 [IU] via SUBCUTANEOUS
  Filled 2016-12-31 (×2): qty 0.2

## 2016-12-31 MED ORDER — PROPOFOL 1000 MG/100ML IV EMUL
0.0000 ug/kg/min | INTRAVENOUS | Status: DC
  Administered 2016-12-31: 40 ug/kg/min via INTRAVENOUS
  Administered 2016-12-31: 20 ug/kg/min via INTRAVENOUS
  Administered 2016-12-31: 30 ug/kg/min via INTRAVENOUS
  Administered 2017-01-01 (×2): 40 ug/kg/min via INTRAVENOUS
  Filled 2016-12-31 (×5): qty 100

## 2016-12-31 MED ORDER — PRO-STAT SUGAR FREE PO LIQD
30.0000 mL | Freq: Two times a day (BID) | ORAL | Status: DC
  Administered 2016-12-31: 30 mL
  Filled 2016-12-31: qty 30

## 2016-12-31 MED ORDER — BACLOFEN 1 MG/ML ORAL SUSPENSION
5.0000 mg | Freq: Three times a day (TID) | ORAL | Status: DC
  Filled 2016-12-31: qty 0.5

## 2016-12-31 MED ORDER — FUROSEMIDE 10 MG/ML IJ SOLN
40.0000 mg | Freq: Two times a day (BID) | INTRAMUSCULAR | Status: DC
  Administered 2016-12-31 (×2): 40 mg via INTRAVENOUS
  Filled 2016-12-31 (×3): qty 4

## 2016-12-31 MED ORDER — PRO-STAT SUGAR FREE PO LIQD
30.0000 mL | Freq: Four times a day (QID) | ORAL | Status: DC
  Administered 2016-12-31 – 2017-01-01 (×3): 30 mL via ORAL
  Filled 2016-12-31 (×7): qty 30

## 2016-12-31 NOTE — Progress Notes (Signed)
RN called d/t pt agitated and increase WOB.  Pt placed back on full vent support per RN request.

## 2016-12-31 NOTE — Progress Notes (Addendum)
Nutrition Consult / Follow-up  DOCUMENTATION CODES:   Morbid obesity  INTERVENTION:    Vital High Protein at 25 ml/h (600 ml per day)   Pro-stat 30 ml QID  Provides 1000 kcals, 113 gm protein, 502 ml free water daily  Intake with TF and Propofol will be ~1450 kcal per day  When Propofol is off, recommend change Vital High Protein goal rate to 55 ml/h and d/c Prostat to provide 1320 kcal, 116 gm protein, 1104 ml free water daily  NUTRITION DIAGNOSIS:   Inadequate oral intake related to inability to eat as evidenced by NPO status.  Ongoing  GOAL:   Provide needs based on ASPEN/SCCM guidelines  Progressing  MONITOR:   TF tolerance, Vent status, I & O's  REASON FOR ASSESSMENT:   Consult Enteral/tube feeding initiation and management  ASSESSMENT:   Pt with PMH of DM, anxiety, depression, fibromyalgia, stroke, chronic pain [facility] for [facility], required intubation.   Discussed patient in ICU rounds and with RN today. Patient was extubated, but required re-intubation. Propofol was discontinued, but plans to resume; RN suspects she will need to run it ~17-23 ml/h (30-40 mcg/kg) as this is what she required yesterday.  Insulin has been increased.  Received MD Consult for TF initiation and management. Patient is currently intubated on ventilator support Temp (24hrs), Avg:99 F (37.2 C), Min:98.5 F (36.9 C), Max:99.8 F (37.7 C)  Propofol: 17 ml/h will provide ~450 kcal from lipid.  Labs and medications reviewed.  Diet Order:  Diet NPO time specified  Skin:  Reviewed, no issues  Last BM:  2/2  Height:   Ht Readings from Last 1 Encounters:  12/27/16 5' (1.524 m)    Weight:   Wt Readings from Last 1 Encounters:  12/31/16 226 lb 3.1 oz (102.6 kg)    Ideal Body Weight:  45.5 kg  BMI:  Body mass index is 44.18 kg/m.  Estimated Nutritional Needs:   Kcal:  [id number]  Protein:  >/= 113 grams   Fluid:  > 1.5  L/day  EDUCATION NEEDS:   No education needs identified at this time  Joaquin CourtsKimberly Harris, RD, LDN, CNSC Pager [phone] After Hours Pager [phone]

## 2016-12-31 NOTE — Progress Notes (Signed)
Inpatient Diabetes Program Recommendations  AACE/ADA: New Consensus Statement on Inpatient Glycemic Control (2015)  Target Ranges:  Prepandial:   less than 140 mg/dL      Peak postprandial:   less than 180 mg/dL (1-2 hours)      Critically ill patients:  140 - 180 mg/dL   Results for [name], Caia C (MRN [id number]) as of 12/31/2016 10:11  Ref. Range 12/30/2016 00:45 12/30/2016 04:12 12/30/2016 07:48 12/30/2016 11:34 12/30/2016 15:35 12/30/2016 19:57  Glucose-Capillary Latest Ref Range: 65 - 99 mg/dL 034213 (H) 742221 (H) 595170 (H) 203 (H) 207 (H) 161 (H)   Results for [name], Henrietta C (MRN [id number]) as of 12/31/2016 10:11  Ref. Range 12/30/2016 23:51 12/31/2016 03:50 12/31/2016 07:55  Glucose-Capillary Latest Ref Range: 65 - 99 mg/dL 295188 (H) 188177 (H) 416212 (H)    Home DM Meds: NPH Insulin 60 units BID       Novolog 1-10 units TID       Glipizide 10 BID  Current Insulin Orders: Lantus 20 units daily      Novolog Resistant Correction Scale/ SSI (0-20 units) Q4 hours      MD- Note Lantus increased to 20 units daily today.  Also note tube feeds to increase to 40cc per hour (goal rate).  If you note glucose levels are rising as tube feeds are increased, please consider starting Novolog tube feed coverage once tube feeds reach goal rate of 40cc/hour:  Novolog 4 units Q4 hours (hold if tube feeds held for any reason)     --Will follow patient during hospitalization--  Ambrose FinlandJeannine Johnston Quincey Nored RN, MSN, CDE Diabetes Coordinator Inpatient Glycemic Control Team Team Pager: [phone] (8a-5p)

## 2016-12-31 NOTE — Progress Notes (Signed)
eLink Physician-Brief Progress Note Patient Name: Izora GalaKay C Matheny DOB: 1946/02/11 MRN: [id number]   Date of Service  12/31/2016  HPI/Events of Note  Patient is not following commands. Patient is currently on a Propofol IV infusion, Fentanyl Patch, Oxycodone PO and Ativan PO.   eICU Interventions  Will order: 1. Will D/c Fentanyl Patch and Oxycodone PO.  Observe clinically for improved mental status.      Intervention Category Major Interventions: Change in mental status - evaluation and management  Nasario Czerniak Eugene 12/31/2016, 5:26 PM

## 2016-12-31 NOTE — Progress Notes (Signed)
eLink Physician-Brief Progress Note Patient Name: Izora GalaKay C Lamountain DOB: 02/12/1946 MRN: [id number]   Date of Service  12/31/2016  HPI/Events of Note  Request to renew restraint order.  eICU Interventions  Will renew restraint order.     Intervention Category Minor Interventions: Agitation / anxiety - evaluation and management  Sommer,Steven Eugene 12/31/2016, 8:50 PM

## 2016-12-31 NOTE — Progress Notes (Signed)
Subjective: More sedated today.   Exam: Vitals:   12/31/16 0753 12/31/16 0800  BP:  129/68  Pulse:  67  Resp:  14  Temp: 98.5 F (36.9 C)    Gen: In bed, NAD Resp: non-labored breathing, no acute distress Abd: soft, nt  Lethargic, does not follow commands. She does orient towards stimuli, but does not clearly look left or right.    Impression: 71 year old female with new onset seizures.  She has "early alzheimers" per the husband and with new onset seizure in the setting of dementia, I do favor continued therapy. She was restarted on baclofen and lorazepam, which I think may   Recommendations: 1) Keppra 500 mg twice a day 2) restart duloxetine 3)  D/c baclofen. When it is restarted, she was taking 5mg  TID, not 10mg  4) decrease lorazepam to 1mg  BID(half home dose).  5) repeat eeg  Ritta SlotMcNeill Agape Hardiman, MD [facility] [phone]  If 7pm- 7am, please page neurology on call as listed in AMION.

## 2016-12-31 NOTE — Care Management Note (Signed)
Case Management Note  Patient Details  Name: Virginia [name] Dilauro MRN: [id number] Date of Birth: [birth year]  Subjective/Objective:  Pt [facility].  PTA, pt independent of ADLS; lives with spouse.                   Action/Plan: Pt currently remains intubated; will follow for discharge planning as pt progresses.    Expected Discharge Date:                  Expected Discharge Plan:  IP Rehab Facility  In-House Referral:     Discharge planning Services  CM Consult  Post Acute Care Choice:    Choice offered to:     DME Arranged:    DME Agency:     HH Arranged:    HH Agency:     Status of Service:  In process, will continue to follow  If discussed at Long Length of Stay Meetings, dates discussed:    Additional Comments:  12/31/2016 Pt lateral move to 45M.  Pt is on ventilator.  CM will continue to follow for discharge plans

## 2016-12-31 NOTE — Progress Notes (Signed)
Pt still not following commands most of day. Pt's BP elevated 166/80 and Dr. Arsenio LoaderSommer notified of changes.

## 2016-12-31 NOTE — Progress Notes (Signed)
PULMONARY / CRITICAL CARE MEDICINE   Name: Virginia [name] MRN: [id number] DOB: [birth year]    ADMISSION DATE:  12/27/2016 CONSULTATION DATE:  12/27/16  REFERRING MD:  Maryruth Bun   CHIEF COMPLAINT:  Seizures  HISTORY OF PRESENT ILLNESS:  Virginia [name] is a 71 y.o. female with PMH as outlined below. She was taken to [facility] 01/29 for seizures.  Per her husband, she was doing well earlier that morning and was watching TV with him when she suddenly gasped, had eyes roll back, arms flex, and turned blue.  He initiated CPR although no clear answer whether she lost pulses.  EMS arrived and pt was altered then became responsive after sternal rub before becoming combative.  In ED, she remained combative and was intubated for airway protection.  She was then transferred to [facility] for evaluation by neuro.  SUBJECTIVE:    Febrile to 100.2 Hypertensive at times  No longer with tachypnea  Increased fentanyl usage overnight   VITAL SIGNS: BP 116/63   Pulse 67   Temp 98.5 F (36.9 C) (Oral)   Resp 14   Ht 5' (1.524 m)   Wt 226 lb 3.1 oz (102.6 kg)   SpO2 93%   BMI 44.18 kg/m   HEMODYNAMICS:    VENTILATOR SETTINGS: Vent Mode: PRVC FiO2 (%):  [30 %] 30 % Set Rate:  [14 bmp] 14 bmp Vt Set:  [460 mL] 460 mL PEEP:  [5 cmH20] 5 cmH20 Pressure Support:  [5 cmH20] 5 cmH20 Plateau Pressure:  [17 cmH20-26 cmH20] 19 cmH20  INTAKE / OUTPUT: I/O last 3 completed shifts: In: 1746.7 [I.V.:846.7; Other:300; NG/GT:185; IV Piggyback:415] Out: 4025 [Urine:3875; Stool:150]   PHYSICAL EXAMINATION: General:  Chronically ill appearing female, NAD on vent. Comfortable. HEENT: MM pink/moist, ETT Neuro:  Not awake or alert, no responding to commands  CV: s1s2 rrr, no m/r/g PULM: good ae. Dec BS bibasilar.  WU:JWJX, non-tender, bsx4 active  Extremities: warm/dry,  Edema in feet, no hip edema noted,  Skin: no rashes or lesions   LABS:  BMET  Recent Labs Lab 12/29/16 1030 12/30/16 0418  12/31/16 0401  NA 142 142 143  K 3.1* 3.3* 3.8  CL 113* 109 105  CO2 23 27 29   BUN 11 15 22*  CREATININE 0.58 0.71 0.65  GLUCOSE 235* 209* 191*    Electrolytes  Recent Labs Lab 12/29/16 0506 12/29/16 1030 12/29/16 1750 12/30/16 0418 12/31/16 0401  CALCIUM  --  8.2*  --  8.2* 8.4*  MG 2.2  --  1.6*  --  2.2  PHOS 3.5  --  2.4*  --  4.3    CBC  Recent Labs Lab 12/28/16 0452 12/30/16 0418 12/31/16 0401  WBC 14.2* 8.6 7.5  HGB 11.3* 10.0* 9.9*  HCT 34.4* 31.8* 31.5*  PLT 179 158 175    Coag's  Recent Labs Lab 12/27/16 1301  INR 1.11    Sepsis Markers  Recent Labs Lab 12/27/16 1833 12/27/16 2110 12/27/16 2300 12/28/16 0452 12/29/16 0506  LATICACIDVEN 3.1*  --  1.9 1.5  --   PROCALCITON  --  1.71  --  1.63 0.50    ABG  Recent Labs Lab 12/27/16 1640 12/28/16 0410  PHART 7.369 7.433  PCO2ART 44.1 35.6  PO2ART 234* 111*    Liver Enzymes  Recent Labs Lab 12/27/16 1301  AST 46*  ALT 20  ALKPHOS 69  BILITOT 1.3*  ALBUMIN 3.1*    Cardiac Enzymes  Recent Labs Lab 12/27/16 1301 12/27/16  1825 12/28/16 0101  TROPONINI 0.27* 0.47* 0.43*    Glucose  Recent Labs Lab 12/30/16 0748 12/30/16 1134 12/30/16 1535 12/30/16 1957 12/30/16 2351 12/31/16 0350  GLUCAP 170* 203* 207* 161* 188* 177*    Imaging No results found.   STUDIES:  CT head 01/29 (OSH) > no acute process. CXR 01/29 > no acute process. MR brain 1/31>>> 1. Extensive motion artifact.  2. No gross structural abnormality as explanation for seizure. 3. No acute intracranial abnormality.  4. Mild progression and parenchymal volume loss and chronic microvascular ischemic changes of the brain.  CULTURES: Urine 1/30>>> (-)  ANTIBIOTICS: Rocephin 1/30>>>12/31/2016  SIGNIFICANT EVENTS: 01/29 > [facility]  LINES/TUBES: ETT 01/29 >1/29, 1/30>>>  DISCUSSION: 71 y.o. female taken to [facility] 01/29 for seizures.  Required intubation in ED then transferred to [facility]  for neuro evaluation.   ASSESSMENT / PLAN:  NEUROLOGIC A:   Acute encephalopathy with seizures. Not sure etiology for sze.  Chronic Pain and anxiety Hx stroke neuropathy, bulging disks, fibromyalgia, anxiety, depression, right eye bells palsy, chronic pain, right foot drop. P:   RASS goal 0 - -1 Neuro following  Continue fentanyl patch 75 mcg q 72, baclofen.  Consider decreasing oxycodone 10 mg BID from QID  Continue Keppra 500 mg twice daily Restart duloxetine per neuro Cont lorazepam 1 mg TID Hold off on gabapentin.  PULMONARY A: Acute hypoxic respiratory failure - initially intubated for airway protection but failed extubation r/t hypoxia and increased WOB.   Acute pulmonary edema Concern for aspiration PNA vs pneumonitis with sze.  P:   Vent support - 8cc/kg. PST trial.  Daily SBT  Continue Lasix 40 mg BID  D/C rocephin   CARDIOVASCULAR A:  Hx HTN, CAD. Pulmonary edema P:  Tele monitoring  cont metoprolol 25mg  BID  Responding to  Lasix  With 2.8 L output  Continue lasix 40 mg BID  Consider CXR today   RENAL A:   AGMA - due to lactate- resolved  Pulm edema P:   Urine cx (-) Continue diuresis   GASTROINTESTINAL A:   GI prophylaxis. Nutrition. P:   SUP: Pantoprazole. Continue TF  HEMATOLOGIC A:   VTE Prophylaxis. P:  SCD's / heparin. CBC in AM.  INFECTIOUS A:   Asp PNA vs pneumonitis vs UTI  P:   No leukocytosis, afebrile, no signs of infection D/C Rocephin   ENDOCRINE A:   DM hyperglcyemia P:   SSI. Increase lantus 15 units daily  Hold home glipizide, humulin, novolog    Family updated:  Multiple family members updated at bedside 1/31. Family updated at bedside on 2/1  Interdisciplinary Family Meeting v Palliative Care Meeting:  Due by: 02/04.   ATTENDING NOTE / ATTESTATION NOTE :   I have discussed the case with the resident/APP  Dr. Cathlean Cower.   I agree with the resident/APP's  history, physical examination, assessment, and  plans.    I have edited the above note and modified it according to our agreed history, physical examination, assessment and plan.   Briefly, Virginia [name] is a 71 y.o. female who was taken to [facility] 01/29 for seizures.  Per her husband, she was doing well earlier that morning and was watching TV with him when she suddenly gasped, had eyes roll back, arms flex, and turned blue.  He initiated CPR although no clear answer whether she lost pulses.  EMS arrived and pt was altered then became responsive after sternal rub before becoming combative. Neuro work  up was (-) She was extubated but had to be reintubated 2/2 resp distress. She has chronic pain and agitation issues.    Last 24 hrs, did weaning but failed 2/2 tachypnea. Also diuresced.   Vitals:  Vitals:   12/31/16 0700 12/31/16 0753 12/31/16 0800 12/31/16 0810  BP: 116/63  129/68   Pulse: 67  67   Resp: 14  14   Temp:  98.5 F (36.9 C)    TempSrc:  Oral    SpO2: 93%  95% 96%  Weight:      Height:        Constitutional/General: well-nourished, well-developed, intubated, sedated, not in any distress  Body mass index is 44.18 kg/m. Wt Readings from Last 3 Encounters:  12/31/16 102.6 kg (226 lb 3.1 oz)  07/28/15 108.9 kg (240 lb)  08/20/14 104.3 kg (230 lb)    HEENT: PERLA, anicteric sclerae. (-) Oral thrush. Intubated, ETT in place  Neck: No masses. Midline trachea. No JVD, (-) LAD. (-) bruits appreciated.  Respiratory/Chest: Grossly normal chest. (-) deformity. (-) Accessory muscle use.  Symmetric expansion. Diminished BS on both lower lung zones. (-) wheezing, rhonchi Crackles at bases (-) egophony  Cardiovascular: Regular rate and  rhythm, heart sounds normal, no murmur or gallops,  Gr 1peripheral edema  Gastrointestinal:  Normal bowel sounds. Soft, non-tender. No hepatosplenomegaly.  (-) masses.   Musculoskeletal:  Normal muscle tone.   Extremities: Grossly normal. (-) clubbing, cyanosis.  Gr 1  edema  Skin: (-) rash,lesions seen.    Neurological/Psychiatric : sedated, intubated. CN grossly intact. (-) lateralizing signs.   CBC Recent Labs     12/30/16  0418  12/31/16  0401  WBC  8.6  7.5  HGB  10.0*  9.9*  HCT  31.8*  31.5*  PLT  158  175    Coag's No results for input(s): APTT, INR in the last 72 hours.  BMET Recent Labs     12/29/16  1030  12/30/16  0418  12/31/16  0401  NA  142  142  143  K  3.1*  3.3*  3.8  CL  113*  109  105  CO2  23  27  29   BUN  11  15  22*  CREATININE  0.58  0.71  0.65  GLUCOSE  235*  209*  191*    Electrolytes Recent Labs     12/29/16  0506  12/29/16  1030  12/29/16  1750  12/30/16  0418  12/31/16  0401  CALCIUM   --   8.2*   --   8.2*  8.4*  MG  2.2   --   1.6*   --   2.2  PHOS  3.5   --   2.4*   --   4.3    Sepsis Markers Recent Labs     12/29/16  0506  PROCALCITON  0.50    ABG No results for input(s): PHART, PCO2ART, PO2ART in the last 72 hours.  Liver Enzymes No results for input(s): AST, ALT, ALKPHOS, BILITOT, ALBUMIN in the last 72 hours.  Cardiac Enzymes No results for input(s): TROPONINI, PROBNP in the last 72 hours.  Glucose Recent Labs     12/30/16  1134  12/30/16  1535  12/30/16  1957  12/30/16  2351  12/31/16  0350  12/31/16  0755  GLUCAP  203*  207*  161*  188*  177*  212*    Imaging Dg Chest Portable 1 View  Result Date:  12/29/2016 CLINICAL DATA:  Intubated, seizure EXAM: PORTABLE CHEST 1 VIEW COMPARISON:  12/28/2016 FINDINGS: Borderline cardiomegaly. Endotracheal tube in place with tip 8 mm above the carina. NG tube in place. Stable left IJ central line. Central mild vascular congestion without convincing pulmonary edema. No segmental infiltrate IMPRESSION: Endotracheal tube in place with tip 8 mm above the carina. NG tube in place. Stable left IJ central line. Central mild vascular congestion without convincing pulmonary edema. No segmental infiltrate. Electronically Signed   By: Natasha MeadLiviu   Pop M.D.   On: 12/29/2016 10:49   Assesment/Plan :  1.  New Onset Seizures / Acute encephalopathy. Pt with chronic pain and anxiety - appears neurologically intact. Neuro following. Appreciate input - cont keppra for now - on home dose : fentanyl, oxycodone. - will decrease ativan to 1 mg BID as she is sedated. - will d/c baclofen and propofol - versed pushes for sedation.  - hold off on neurontin. Figuring out what SSRI she was on  2. Acute hypoxemic resp failure 2/2 unable to protect airway + asp pneumonitis + pulm edema - will d/c rocephin - cont with lasix 40 mg IV bid  3. HTN - cont lasix - cont lopressor  4. DM - inc lantus to 20 u daily (was on 60 u BID NPH at home)  5. On heparin sq for dvt prophylaxis.   I spent  30  minutes of Critical Care time with this patient today. This is my time spent independent of the APP or resident.   Family :Family updated at length today. Spoke to husband.   Pollie MeyerJ. Angelo A de Dios, MD 12/31/2016, 8:59 AM [facility] Pager [phone] After 3 pm or if no answer, call [phone]

## 2017-01-01 ENCOUNTER — Inpatient Hospital Stay (HOSPITAL_COMMUNITY): Payer: PPO

## 2017-01-01 ENCOUNTER — Encounter (HOSPITAL_COMMUNITY): Payer: Self-pay | Admitting: Pulmonary Disease

## 2017-01-01 DIAGNOSIS — N179 Acute kidney failure, unspecified: Secondary | ICD-10-CM

## 2017-01-01 LAB — GLUCOSE, CAPILLARY
GLUCOSE-CAPILLARY: 220 mg/dL — AB (ref 65–99)
GLUCOSE-CAPILLARY: 249 mg/dL — AB (ref 65–99)
Glucose-Capillary: 233 mg/dL — ABNORMAL HIGH (ref 65–99)
Glucose-Capillary: 232 mg/dL — ABNORMAL HIGH (ref 65–99)
Glucose-Capillary: 228 mg/dL — ABNORMAL HIGH (ref 65–99)
Glucose-Capillary: 218 mg/dL — ABNORMAL HIGH (ref 65–99)

## 2017-01-01 LAB — BASIC METABOLIC PANEL
ANION GAP: 10 (ref 5–15)
ANION GAP: 7 (ref 5–15)
BUN: 24 mg/dL — ABNORMAL HIGH (ref 6–20)
BUN: 26 mg/dL — ABNORMAL HIGH (ref 6–20)
CALCIUM: 8.8 mg/dL — AB (ref 8.9–10.3)
CHLORIDE: 104 mmol/L (ref 101–111)
CO2: 32 mmol/L (ref 22–32)
CO2: 31 mmol/L (ref 22–32)
Calcium: 9 mg/dL (ref 8.9–10.3)
Chloride: 99 mmol/L — ABNORMAL LOW (ref 101–111)
Creatinine, Ser: 0.67 mg/dL (ref 0.44–1.00)
Creatinine, Ser: 0.76 mg/dL (ref 0.44–1.00)
GFR calc Af Amer: >60 mL/min (ref >60)
GFR calc non Af Amer: >60 mL/min (ref >60)
Glucose, Bld: 227 mg/dL — ABNORMAL HIGH (ref 65–99)
Glucose, Bld: 247 mg/dL — ABNORMAL HIGH (ref 65–99)
POTASSIUM: 3.1 mmol/L — AB (ref 3.5–5.1)
Potassium: 4.1 mmol/L (ref 3.5–5.1)
SODIUM: 141 mmol/L (ref 135–145)
Sodium: 142 mmol/L (ref 135–145)

## 2017-01-01 LAB — AMMONIA: AMMONIA: 35 umol/L (ref 9–35)

## 2017-01-01 LAB — CBC
HEMATOCRIT: 35.9 % — AB (ref 36.0–46.0)
Hemoglobin: 11.2 g/dL — ABNORMAL LOW (ref 12.0–15.0)
MCH: 28.1 pg (ref 26.0–34.0)
MCHC: 31.2 g/dL (ref 30.0–36.0)
MCV: 90.2 fL (ref 78.0–100.0)
PLATELETS: 221 10*3/uL (ref 150–400)
RBC: 3.98 MIL/uL (ref 3.87–5.11)
RDW: 13.6 % (ref 11.5–15.5)
WBC: 8.8 10*3/uL (ref 4.0–10.5)

## 2017-01-01 LAB — PHOSPHORUS: Phosphorus: 3.8 mg/dL (ref 2.5–4.6)

## 2017-01-01 LAB — MAGNESIUM: MAGNESIUM: 2.1 mg/dL (ref 1.7–2.4)

## 2017-01-01 MED ORDER — INSULIN GLARGINE 100 UNIT/ML ~~LOC~~ SOLN
25.0000 [IU] | Freq: Every day | SUBCUTANEOUS | Status: DC
  Administered 2017-01-01: 25 [IU] via SUBCUTANEOUS
  Filled 2017-01-01 (×2): qty 0.25

## 2017-01-01 MED ORDER — FUROSEMIDE 10 MG/ML IJ SOLN
20.0000 mg | Freq: Two times a day (BID) | INTRAMUSCULAR | Status: DC
  Administered 2017-01-01 – 2017-01-05 (×8): 20 mg via INTRAVENOUS
  Filled 2017-01-01 (×10): qty 2

## 2017-01-01 MED ORDER — OXYCODONE HCL 5 MG PO TABS
10.0000 mg | ORAL_TABLET | Freq: Three times a day (TID) | ORAL | Status: DC
  Administered 2017-01-01 – 2017-01-05 (×9): 10 mg via ORAL
  Filled 2017-01-01 (×9): qty 2

## 2017-01-01 MED ORDER — VITAL HIGH PROTEIN PO LIQD
1000.0000 mL | ORAL | Status: DC
  Administered 2017-01-02 (×2): 1000 mL
  Filled 2017-01-01 (×4): qty 1000

## 2017-01-01 MED ORDER — POTASSIUM CHLORIDE 20 MEQ/15ML (10%) PO SOLN
30.0000 meq | ORAL | Status: AC
  Administered 2017-01-01 (×2): 30 meq
  Filled 2017-01-01 (×2): qty 30

## 2017-01-01 MED ORDER — DEXMEDETOMIDINE HCL IN NACL 200 MCG/50ML IV SOLN
0.2000 ug/kg/h | INTRAVENOUS | Status: DC
  Administered 2017-01-01: 0.4 ug/kg/h via INTRAVENOUS
  Administered 2017-01-01: 0.6 ug/kg/h via INTRAVENOUS
  Administered 2017-01-01 (×2): 0.4 ug/kg/h via INTRAVENOUS
  Filled 2017-01-01 (×4): qty 50

## 2017-01-01 MED ORDER — SODIUM CHLORIDE 0.9 % IV BOLUS (SEPSIS)
1000.0000 mL | Freq: Once | INTRAVENOUS | Status: AC
  Administered 2017-01-01: 1000 mL via INTRAVENOUS

## 2017-01-01 MED ORDER — SODIUM CHLORIDE 0.9 % IV SOLN: INTRAVENOUS | Status: DC

## 2017-01-01 MED ORDER — SODIUM CHLORIDE 0.9 % IV SOLN
0.0000 ug/h | INTRAVENOUS | Status: DC
  Administered 2017-01-01: 100 ug/h via INTRAVENOUS
  Administered 2017-01-03: 200 ug/h via INTRAVENOUS
  Filled 2017-01-01 (×2): qty 50

## 2017-01-01 MED ORDER — DEXMEDETOMIDINE HCL IN NACL 400 MCG/100ML IV SOLN
0.4000 ug/kg/h | INTRAVENOUS | Status: DC
  Administered 2017-01-01: 1.2 ug/kg/h via INTRAVENOUS
  Administered 2017-01-02 (×2): 1 ug/kg/h via INTRAVENOUS
  Administered 2017-01-02 – 2017-01-03 (×2): 0.4 ug/kg/h via INTRAVENOUS
  Administered 2017-01-03 (×2): 1 ug/kg/h via INTRAVENOUS
  Administered 2017-01-04: 1.2 ug/kg/h via INTRAVENOUS
  Administered 2017-01-04: 0.5 ug/kg/h via INTRAVENOUS
  Filled 2017-01-01 (×11): qty 100

## 2017-01-01 MED ORDER — FENTANYL 75 MCG/HR TD PT72: 75.0000 ug | MEDICATED_PATCH | TRANSDERMAL | Status: DC

## 2017-01-01 MED ORDER — FENTANYL 25 MCG/HR TD PT72
25.0000 ug | MEDICATED_PATCH | TRANSDERMAL | Status: DC
  Administered 2017-01-01: 25 ug via TRANSDERMAL
  Filled 2017-01-01 (×2): qty 1

## 2017-01-01 NOTE — Progress Notes (Signed)
eLink Physician-Brief Progress Note Patient Name: Virginia [name] DOB: Jul 04, 1946 MRN: [id number]   Date of Service  01/01/2017  HPI/Events of Note  Multiple issues: 1. Bradycardia - HR = 61 and 2. Oliguria.   eICU Interventions  Will order: 1. Decrease Precedex to 0.2 mcg/kg/hour. 2. Hold Metoprolol and Labetalol doses for HR < 60. 3. Bolus with 0.9 NaCl 1 liter IV over 1 hour now.      Intervention Category Major Interventions: Arrhythmia - evaluation and management Intermediate Interventions: Oliguria - evaluation and management  Sommer,Steven Eugene 01/01/2017, 3:44 PM

## 2017-01-01 NOTE — Progress Notes (Signed)
   [facility] PCCM  Husband tells us now that pt's fentanyl patch 75 mcg patch (she has been on for yrs) makes her sleepy at times.  She needs it.  Will switch to 25 mcg/patch from 775 mcg/patch and see what happens.   Pollie MeyerJ. Angelo A de Dios, MD 01/01/2017, 9:26 AM [place] [facility] Pager [phone] After 3 pm or if no answer, call [phone]

## 2017-01-01 NOTE — Procedures (Signed)
History: 71 year old female with encephalopathy  Sedation: Propofol  Technique: This is a 21 channel routine scalp EEG performed at the bedside with bipolar and monopolar montages arranged in accordance to the international 10/20 system of electrode placement. One channel was dedicated to EKG recording.    Background: The background consists predominantly of irregular generalized slow activity. There is a posterior dominant rhythm which achieves a frequency of 7 Hz. There are waxing/waning discharges with a shifting bifrontal predominance with triphasic morphology and an AP lag.  Photic stimulation: Physiologic driving is not performed  EEG Abnormalities: 1) triphasic waves 2) generalized irregular slow activity 3) slow PDR  Clinical Interpretation: This EEG is consistent with a moderate generalized nonspecific cerebral dysfunction (encephalopathy). There was no seizure or seizure predisposition recorded during the study.   Ritta SlotMcNeill Bronwyn Belasco, MD [facility] [phone]  If 7pm- 7am, please page neurology on call as listed in AMION.

## 2017-01-01 NOTE — Progress Notes (Signed)
[facility] FOR AM LAB REPLACEMENT ONLY  The patient does [facility] the [facility]ly[facility] Protocol based on the criteria listed below:   1. Is GFR >/= 40 ml/min? Yes.    Patient's GFR today is >60 2. Is urine output >/= 0.5 ml/kg/hr for the last 6 hours? Yes.   Patient's UOP is 1.03 ml/kg/hr 3. Is BUN < 60 mg/dL? Yes.    Patient's BUN today is 24 4. Abnormal electrolyte(s):Potassium 3.1 5. Ordered repletion with: Potassium per Protocol 6. If a panic level lab has been reported, has the CCM MD in charge been notified? No..   Physician:    Thomasenia BottomsLANTZY, Devonna Oboyle P 01/01/2017 4:27 AM

## 2017-01-01 NOTE — Progress Notes (Signed)
EEG Completed; Results Pending  

## 2017-01-01 NOTE — Progress Notes (Signed)
Subjective: More sedated today.   Exam: Vitals:   01/01/17 0754 01/01/17 0900  BP:  128/60  Pulse:  74  Resp:  16  Temp: 99.4 F (37.4 C)    Gen: In bed, NAD Resp: non-labored breathing, no acute distress Abd: soft, nt  Lethargic, does not follow commands. She does orient towards stimuli, but does not clearly look left or right.  She moves all extremities spontaneously and to noxious stimuli.   Impression: 71 year old female with new onset seizures.  She has "early alzheimers" per the husband and with new onset seizure in the setting of dementia, I do favor continued AED therapy. She was restarted on baclofen and lorazepam, which I think may be the reason for her change yesterday.  I still think that this is the most likely explanation, but will repeat EEG today.   Recommendations: 1) Keppra 500 mg twice a day 2) sedation per CCM 3) repeat eeg today.   Ritta SlotMcNeill Erhardt Dada, MD [facility] [phone]  If 7pm- 7am, please page neurology on call as listed in AMION.

## 2017-01-01 NOTE — Progress Notes (Signed)
CBG been >200 since last night. Informed Dr.De Dios and increased Lantus to 25 units. Its been continiously >200 all day, checked with Dr.Sommer regarding initiating Insulin drip. Advised to monitor for now as it is in lower 200's. Will monitor.     Per pt's family her sugar runs >300 often at home.    Danne HarborJohny,RN

## 2017-01-01 NOTE — Progress Notes (Signed)
eLink Physician-Brief Progress Note Patient Name: Virginia [name] C Minami DOB: [birth year] MRN: [id number]   Date of Service  01/01/2017  HPI/Events of Note  Agitation - Presently on a Precedex IV infusion and a Fentanyl patch. Patient is on a higher dose Fentanyl patch at home + oxycodone.   eICU Interventions  Will order: 1. D/C Fentanyl patch. 2. Fentanyl IV infusion. Titrate to RASS = 0.     Intervention Category Minor Interventions: Agitation / anxiety - evaluation and management  Lenell AntuSommer,Steven Eugene 01/01/2017, 9:34 PM

## 2017-01-01 NOTE — Progress Notes (Signed)
PULMONARY / CRITICAL CARE MEDICINE   Name: CHINENYE [name] MRN: [id number] DOB: Dec 06, 1945    ADMISSION DATE:  12/27/2016 CONSULTATION DATE:  12/27/16  REFERRING MD:  Maryruth Bun   CHIEF COMPLAINT:  Seizures  HISTORY OF PRESENT ILLNESS:  Virginia [name] is a 71 y.o. female with PMH as outlined below. She was taken to [facility] 01/29 for seizures.  Per her husband, she was doing well earlier that morning and was watching TV with him when she suddenly gasped, had eyes roll back, arms flex, and turned blue.  He initiated CPR although no clear answer whether she lost pulses.  EMS arrived and pt was altered then became responsive after sternal rub before becoming combative.  In ED, she remained combative and was intubated for airway protection.  She was then transferred to [facility] for evaluation by neuro.  SUBJECTIVE:    Pt was drowsy and with blank stares on 2/2 so propofol was dc'd.  She became agitated requiring frequent versed.  She ended up being placed back on propofol and became sedated again.  RN calls [facility] and fentanyl patch and oxycodone were discontinued.  Remains sleepy this am.     VITAL SIGNS: BP (!) 144/75   Pulse 82   Temp 99.4 F (37.4 C) (Oral)   Resp 15   Ht 5' (1.524 m)   Wt 100.4 kg (221 lb 5.5 oz)   SpO2 94%   BMI 43.23 kg/m   HEMODYNAMICS:    VENTILATOR SETTINGS: Vent Mode: PRVC FiO2 (%):  [30 %] 30 % Set Rate:  [14 bmp] 14 bmp Vt Set:  [460 mL] 460 mL PEEP:  [5 cmH20] 5 cmH20 Pressure Support:  [5 cmH20] 5 cmH20 Plateau Pressure:  [13 cmH20-24 cmH20] 24 cmH20  INTAKE / OUTPUT: I/O last 3 completed shifts: In: 1829.7 [I.V.:631.3; Other:250; NG/GT:633.4; IV Piggyback:315] Out: 3800 [Urine:3800]   PHYSICAL EXAMINATION: General:  Chronically ill appearing female, NAD on vent. Comfortable. HEENT: MM pink/moist, ETT Neuro:  Not awake or alert, no responding to commands  CV: s1s2 rrr, no m/r/g PULM: good ae. Dec BS bibasilar.  WU:JWJX, non-tender,  bsx4 active  Extremities: warm/dry,  Edema in feet, no hip edema noted,  Skin: no rashes or lesions   LABS:  BMET  Recent Labs Lab 12/30/16 0418 12/31/16 0401 01/01/17 0205  NA 142 143 141  K 3.3* 3.8 3.1*  CL 109 105 99*  CO2 27 29 32  BUN 15 22* 24*  CREATININE 0.71 0.65 0.67  GLUCOSE 209* 191* 227*    Electrolytes  Recent Labs Lab 12/29/16 1750 12/30/16 0418 12/31/16 0401 01/01/17 0205  CALCIUM  --  8.2* 8.4* 9.0  MG 1.6*  --  2.2 2.1  PHOS 2.4*  --  4.3 3.8    CBC  Recent Labs Lab 12/30/16 0418 12/31/16 0401 01/01/17 0205  WBC 8.6 7.5 8.8  HGB 10.0* 9.9* 11.2*  HCT 31.8* 31.5* 35.9*  PLT 158 175 221    Coag's  Recent Labs Lab 12/27/16 1301  INR 1.11    Sepsis Markers  Recent Labs Lab 12/27/16 1833 12/27/16 2110 12/27/16 2300 12/28/16 0452 12/29/16 0506  LATICACIDVEN 3.1*  --  1.9 1.5  --   PROCALCITON  --  1.71  --  1.63 0.50    ABG  Recent Labs Lab 12/27/16 1640 12/28/16 0410  PHART 7.369 7.433  PCO2ART 44.1 35.6  PO2ART 234* 111*    Liver Enzymes  Recent Labs Lab 12/27/16 1301  AST 46*  ALT 20  ALKPHOS 69  BILITOT 1.3*  ALBUMIN 3.1*    Cardiac Enzymes  Recent Labs Lab 12/27/16 1301 12/27/16 1825 12/28/16 0101  TROPONINI 0.27* 0.47* 0.43*    Glucose  Recent Labs Lab 12/31/16 1153 12/31/16 1521 12/31/16 1946 01/01/17 0013 01/01/17 0401 01/01/17 0751  GLUCAP 204* 161* 200* 220* 233* 232*    Imaging Dg Chest Port 1 View  Result Date: 01/01/2017 CLINICAL DATA:  Pulmonary edema.  Intubated patient post seizure. EXAM: PORTABLE CHEST 1 VIEW COMPARISON:  12/29/2016 and 12/28/2016. FINDINGS: 0431 hour. The endotracheal tube, nasogastric tube and left IJ central venous catheter appear unchanged in position. The heart size and mediastinal contours are stable. There are persistent low lung volumes. There is developing retrocardiac left lower lobe opacity without consolidation. No pneumothorax or significant  pleural effusion identified. The bones appear unchanged following lower cervical fusion. IMPRESSION: Developing retrocardiac atelectasis or infiltrate. Stable support system. Electronically Signed   By: Carey BullocksWilliam  Veazey M.D.   On: 01/01/2017 07:08     STUDIES:  CT head 01/29 (OSH) > no acute process. CXR 01/29 > no acute process. MR brain 1/31>>> 1. Extensive motion artifact.  2. No gross structural abnormality as explanation for seizure. 3. No acute intracranial abnormality.  4. Mild progression and parenchymal volume loss and chronic microvascular ischemic changes of the brain.  CULTURES: Urine 1/30>>> (-)  ANTIBIOTICS: Rocephin 1/30>>>12/31/2016  SIGNIFICANT EVENTS: [facility]29 [facility]  LINES/TUBES: ETT 01/29 >1/29, 1/30>>>  DISCUSSION: 71 [facility].  [facility] intubation in ED [facility]   ASSESSMENT / PLAN:  NEUROLOGIC A:   Acute encephalopathy with seizures. Not sure etiology for sze.  New onset Szes.  Chronic Pain and anxiety Hx stroke neuropathy, bulging disks, fibromyalgia, anxiety, depression, right eye bells palsy, chronic pain, right foot drop. P:   RASS goal 0 - -1 Neuro following. Appreciate input! Will resume  fentanyl patch 75 mcg q 72. She has been on this for a long time.   Will resume  oxycodone 10 mg BID from TID Continue Keppra 500 mg twice daily Cont lorazepam 1 mg BID Hold off on gabapentin. Need to verify pt's SSRI that she takes at home.  NO MORE PROPOFOL DRIP for this pt. Makes her really sleepy.  Try precedex drip and prn versed.   PULMONARY A: Acute hypoxic respiratory failure - initially intubated for airway protection but failed extubation r/t hypoxia and increased WOB.   Acute pulmonary edema. Improved Concern for aspiration PNA vs pneumonitis with sze.  P:   Vent support - 8cc/kg. PST trial.  Daily SBT  Decrease lasix to 20 mg IV q12 from 40 mg IV q12. She was  on Lasix 40 mg BID at home.  Rocephin dc/d on 2/2. WOF worsening clinical picture concerning for infxn.  If febrile, restart abx with vanc/zosyn  CARDIOVASCULAR A:  Hx HTN, CAD. Pulmonary edema P:  Tele monitoring  cont metoprolol 25mg  BID  Decrease lasix to 20 mg IV q12    RENAL A:   AGMA - due to lactate- resolved  Pulm edema. improved P:   Urine cx (-) Continue diuresis   GASTROINTESTINAL A:   GI prophylaxis. Nutrition. P:   SUP: Pantoprazole. Continue TF  HEMATOLOGIC A:   VTE Prophylaxis. P:  SCD's / heparin. CBC in AM.  INFECTIOUS A:  Asp PNA vs pneumonitis  P:   No leukocytosis, afebrile, no signs of infection Rocephin  dc'd on 2/2.   ENDOCRINE A:   DM hyperglcyemia P:   SSI. Increase lantus 20  units daily  Hold home glipizide, humulin, novolog    Family updated:  Multiple family members updated at bedside 1/31. Family updated at bedside on 2/3.   Interdisciplinary Family Meeting v Palliative Care Meeting:  Due by: 02/04.    I spent  30  minutes of Critical Care time with this patient today.   Pollie Meyer, MD 01/01/2017, 8:39 AM [place] [facility] Pager [phone] After 3 pm or if no answer, call [phone]      Pager [phone] After 3 pm or if no answer, call [phone]

## 2017-01-02 ENCOUNTER — Encounter (HOSPITAL_COMMUNITY): Payer: Self-pay

## 2017-01-02 DIAGNOSIS — J189 Pneumonia, unspecified organism: Secondary | ICD-10-CM

## 2017-01-02 LAB — GLUCOSE, CAPILLARY
GLUCOSE-CAPILLARY: 235 mg/dL — AB (ref 65–99)
Glucose-Capillary: 201 mg/dL — ABNORMAL HIGH (ref 65–99)
Glucose-Capillary: 280 mg/dL — ABNORMAL HIGH (ref 65–99)
Glucose-Capillary: 281 mg/dL — ABNORMAL HIGH (ref 65–99)
Glucose-Capillary: 314 mg/dL — ABNORMAL HIGH (ref 65–99)
Glucose-Capillary: 324 mg/dL — ABNORMAL HIGH (ref 65–99)

## 2017-01-02 LAB — CULTURE, RESPIRATORY: CULTURE: NORMAL

## 2017-01-02 LAB — CBC
HCT: 33.8 % — ABNORMAL LOW (ref 36.0–46.0)
Hemoglobin: 10.5 g/dL — ABNORMAL LOW (ref 12.0–15.0)
MCH: 28.2 pg (ref 26.0–34.0)
MCHC: 31.1 g/dL (ref 30.0–36.0)
MCV: 90.6 fL (ref 78.0–100.0)
Platelets: 206 10*3/uL (ref 150–400)
RBC: 3.73 MIL/uL — ABNORMAL LOW (ref 3.87–5.11)
RDW: 13.6 % (ref 11.5–15.5)
WBC: 8.8 10*3/uL (ref 4.0–10.5)

## 2017-01-02 LAB — BASIC METABOLIC PANEL
Anion gap: 7 (ref 5–15)
BUN: 21 mg/dL — AB (ref 6–20)
CALCIUM: 8.5 mg/dL — AB (ref 8.9–10.3)
CO2: 30 mmol/L (ref 22–32)
CREATININE: 0.65 mg/dL (ref 0.44–1.00)
Chloride: 105 mmol/L (ref 101–111)
GFR calc Af Amer: >60 mL/min (ref >60)
Glucose, Bld: 246 mg/dL — ABNORMAL HIGH (ref 65–99)
Potassium: 3.9 mmol/L (ref 3.5–5.1)
SODIUM: 142 mmol/L (ref 135–145)

## 2017-01-02 LAB — MAGNESIUM: MAGNESIUM: 2.2 mg/dL (ref 1.7–2.4)

## 2017-01-02 LAB — PHOSPHORUS: Phosphorus: 3.3 mg/dL (ref 2.5–4.6)

## 2017-01-02 MED ORDER — DEXAMETHASONE SODIUM PHOSPHATE 10 MG/ML IJ SOLN
10.0000 mg | Freq: Four times a day (QID) | INTRAMUSCULAR | Status: AC
  Administered 2017-01-02 – 2017-01-03 (×4): 10 mg via INTRAVENOUS
  Filled 2017-01-02 (×4): qty 1

## 2017-01-02 MED ORDER — VANCOMYCIN HCL 10 G IV SOLR
2000.0000 mg | Freq: Once | INTRAVENOUS | Status: AC
  Administered 2017-01-02: 2000 mg via INTRAVENOUS
  Filled 2017-01-02: qty 2000

## 2017-01-02 MED ORDER — PIPERACILLIN-TAZOBACTAM 3.375 G IVPB 30 MIN
3.3750 g | Freq: Once | INTRAVENOUS | Status: AC
  Administered 2017-01-02: 3.375 g via INTRAVENOUS
  Filled 2017-01-02: qty 50

## 2017-01-02 MED ORDER — INSULIN GLARGINE 100 UNIT/ML ~~LOC~~ SOLN
50.0000 [IU] | Freq: Every day | SUBCUTANEOUS | Status: DC
  Administered 2017-01-02 – 2017-01-03 (×2): 50 [IU] via SUBCUTANEOUS
  Filled 2017-01-02 (×3): qty 0.5

## 2017-01-02 MED ORDER — PIPERACILLIN-TAZOBACTAM 3.375 G IVPB
3.3750 g | Freq: Three times a day (TID) | INTRAVENOUS | Status: DC
  Administered 2017-01-02 – 2017-01-04 (×5): 3.375 g via INTRAVENOUS
  Filled 2017-01-02 (×6): qty 50

## 2017-01-02 MED ORDER — VANCOMYCIN HCL IN DEXTROSE 750-5 MG/150ML-% IV SOLN
750.0000 mg | Freq: Two times a day (BID) | INTRAVENOUS | Status: DC
  Administered 2017-01-03 – 2017-01-04 (×3): 750 mg via INTRAVENOUS
  Filled 2017-01-02 (×4): qty 150

## 2017-01-02 MED ORDER — INSULIN ASPART 100 UNIT/ML ~~LOC~~ SOLN
5.0000 [IU] | SUBCUTANEOUS | Status: DC
  Administered 2017-01-02 – 2017-01-03 (×6): 5 [IU] via SUBCUTANEOUS

## 2017-01-02 NOTE — Progress Notes (Signed)
RN notified of pt temp of 101.7 Oral

## 2017-01-02 NOTE — Procedures (Signed)
Pt has - cuff leak at this time.  MD at bedside.  Will hold off on extubating today, increased PS to 10 for pt comfort.

## 2017-01-02 NOTE — Progress Notes (Signed)
Subjective: More sedated today.   Exam: Vitals:   01/02/17 0800 01/02/17 0803  BP: (!) 141/59   Pulse: 70   Resp: 18   Temp:  (!) 101.7 F (38.7 C)   Gen: In bed, intubated Resp: ventilated.  Abd: soft, nt  Much more awake today. Pupils are reactive bilaterally. She follows commands readily on both sides. She crosses midline in both directions to command. Endorses sensation bilaterally.   Impression: 71 year old female with new onset seizures.  She has "early alzheimers" per the husband and with new onset seizure in the setting of dementia, I do favor continued AED therapy. She initially had a normal mental status for several days, but had some agitation and there was concern for possible withdrawal as she takes chronic narcotics, benzodiazepines, and baclofen. She was restarted on less than her full home dose on 2/1 and then on 2/2 was no longer following commands. This was attributed to medication and her meds were weaned, but on 2/3 she remained not following commands. EEG showed triphasic waves. She was therefore further titrated down and changed from propofol to precedex. Today she seems improved. Suspect multifactorial delirium given that she seems to be developing infiltrate on XR and spiked a fever overnight.   Recommendations: 1) Keppra 500 mg twice a day 2) sedation/progression towards extubation per CCM  Ritta SlotMcNeill Juri Dinning, MD [facility] [phone]  If 7pm- 7am, please page neurology on call as listed in AMION.

## 2017-01-02 NOTE — Progress Notes (Signed)
PULMONARY / CRITICAL CARE MEDICINE   Name: Virginia [name] MRN: [id number] DOB: 21-Mar-1946    ADMISSION DATE:  12/27/2016 CONSULTATION DATE:  12/27/16  REFERRING MD:  Maryruth Bun   CHIEF COMPLAINT:  Seizures  HISTORY OF PRESENT ILLNESS:  Virginia [name] is a 71 y.o. female with PMH as outlined below. She was taken to [facility] 01/29 for seizures.  Per her husband, she was doing well earlier that morning and was watching TV with him when she suddenly gasped, had eyes roll back, arms flex, and turned blue.  He initiated CPR although no clear answer whether she lost pulses.  EMS arrived and pt was altered then became responsive after sternal rub before becoming combative.  In ED, she remained combative and was intubated for airway protection.  She was then transferred to [facility] for evaluation by neuro.  SUBJECTIVE:    Mental status is better. Awake, following commands. On fentanyl drip at 50 mcg/kg/min and she is awake and comfortable.  Fentanyl  Patch was dc'd on 2/3. precedex drip added to keep her sleepy.  Did well on PST bu no cuff leak.   VITAL SIGNS: BP (!) 141/59   Pulse 72   Temp (!) 101.7 F (38.7 C) (Oral)   Resp (!) 24   Ht 5' (1.524 m)   Wt 100.8 kg (222 lb 3.6 oz)   SpO2 97%   BMI 43.40 kg/m   HEMODYNAMICS:    VENTILATOR SETTINGS: Vent Mode: PSV;CPAP FiO2 (%):  [30 %-40 %] 40 % Set Rate:  [14 bmp] 14 bmp Vt Set:  [460 mL] 460 mL PEEP:  [5 cmH20] 5 cmH20 Pressure Support:  [5 cmH20-12 cmH20] 10 cmH20 Plateau Pressure:  [18 cmH20-20 cmH20] 18 cmH20  INTAKE / OUTPUT: I/O last 3 completed shifts: In: 2832.2 [I.V.:919.7; Other:120; IO/NG:2952.8; IV Piggyback:315] Out: 1805 [Urine:1805]   PHYSICAL EXAMINATION: General:  Chronically ill appearing female, NAD on vent. Comfortable. HEENT: MM pink/moist, ETT Neuro:  Not awake or alert, no responding to commands  CV: s1s2 rrr, no m/r/g PULM: good ae. Dec BS bibasilar.  UX:LKGM, non-tender, bsx4 active   Extremities: warm/dry,  Edema in feet, no hip edema noted,  Skin: no rashes or lesions   LABS:  BMET  Recent Labs Lab 01/01/17 0205 01/01/17 1500 01/02/17 0446  NA 141 142 142  K 3.1* 4.1 3.9  CL 99* 104 105  CO2 32 31 30  BUN 24* 26* 21*  CREATININE 0.67 0.76 0.65  GLUCOSE 227* 247* 246*    Electrolytes  Recent Labs Lab 12/31/16 0401 01/01/17 0205 01/01/17 1500 01/02/17 0446  CALCIUM 8.4* 9.0 8.8* 8.5*  MG 2.2 2.1  --  2.2  PHOS 4.3 3.8  --  3.3    CBC  Recent Labs Lab 12/31/16 0401 01/01/17 0205 01/02/17 0446  WBC 7.5 8.8 8.8  HGB 9.9* 11.2* 10.5*  HCT 31.5* 35.9* 33.8*  PLT 175 221 206    Coag's  Recent Labs Lab 12/27/16 1301  INR 1.11    Sepsis Markers  Recent Labs Lab 12/27/16 1833 12/27/16 2110 12/27/16 2300 12/28/16 0452 12/29/16 0506  LATICACIDVEN 3.1*  --  1.9 1.5  --   PROCALCITON  --  1.71  --  1.63 0.50    ABG  Recent Labs Lab 12/27/16 1640 12/28/16 0410  PHART 7.369 7.433  PCO2ART 44.1 35.6  PO2ART 234* 111*    Liver Enzymes  Recent Labs Lab 12/27/16 1301  AST 46*  ALT 20  ALKPHOS 69  BILITOT 1.3*  ALBUMIN 3.1*    Cardiac Enzymes  Recent Labs Lab 12/27/16 1301 12/27/16 1825 12/28/16 0101  TROPONINI 0.27* 0.47* 0.43*    Glucose  Recent Labs Lab 01/01/17 1211 01/01/17 1529 01/01/17 1944 01/02/17 0019 01/02/17 0438 01/02/17 0801  GLUCAP 228* 218* 249* 201* 235* 314*    Imaging No results found.   STUDIES:  CT head 01/29 (OSH) > no acute process. CXR 01/29 > no acute process. MR brain 1/31>>> 1. Extensive motion artifact.  2. No gross structural abnormality as explanation for seizure. 3. No acute intracranial abnormality.  4. Mild progression and parenchymal volume loss and chronic microvascular ischemic changes of the brain.  CULTURES: Urine 1/30>>> (-)  ANTIBIOTICS: Rocephin 1/30>>>12/31/2016 Zosyn 2/4 > Vanc 2/24 >   SIGNIFICANT EVENTS: 01/29 > transfer to  [facility]  LINES/TUBES: ETT 01/29 >1/29, 1/30>>>  DISCUSSION: 71 y.o. female taken to [facility] 01/29 for seizures.  Required intubation in ED then transferred to [facility] for neuro evaluation.   ASSESSMENT / PLAN:  NEUROLOGIC A:   Acute encephalopathy with seizures. Not sure etiology for sze.  New onset Szes.  Chronic Pain and anxiety Hx stroke neuropathy, bulging disks, fibromyalgia, anxiety, depression, right eye bells palsy, chronic pain, right foot drop. P:   RASS goal 0 - -1 Neuro following. Appreciate input! Currently on fentanyl drip at 50 mcg/kg/min. Fentanyl patch is off.  Cont  oxycodone 10 mg BID from TID (was on QID at home) Continue Keppra 500 mg twice daily Cont lorazepam 1 mg BID (was on higher dose at home) Hold off on gabapentin. Need to verify pt's SSRI that she takes at home.  NO MORE PROPOFOL DRIP for this pt. Makes her really sleepy. (synergistic with keppra) Precedex drip prn.   PULMONARY A: Acute hypoxic respiratory failure - initially intubated for airway protection but failed extubation r/t hypoxia and increased WOB.   Acute pulmonary edema. Improved Concern for aspiration PNA vs pneumonitis with sze. She initially improved but starting to have fevers and now with L base infuiltrate. Concern for HCAP (early).  P:   Vent support - 8cc/kg. PST trial.  Did well on SBT but no cuff leak. Start decadron x 24 hrs.  Anticipate we can extubate in am unless HCAP gets worse.  Start vanc and zosyn fro HCAP LLL (MRSA nares positive) Cont lasix 20 mg IV q12.  She was on Lasix 40 mg BID at home.    CARDIOVASCULAR A:  Hx HTN, CAD. Pulmonary edema P:  Tele monitoring  cont metoprolol 25mg  BID  Cont lasix to 20 mg IV q 12.    RENAL A:   AGMA - due to lactate- resolved  Pulm edema. improved P:   Urine cx (-) Continue diuresis   GASTROINTESTINAL A:   GI prophylaxis. Nutrition. P:   SUP: Pantoprazole. Continue TF  HEMATOLOGIC A:   VTE Prophylaxis. P:  SCD's  / heparin. CBC in AM.  INFECTIOUS A:  Asp pneumonitis initially. Concern for LLL HCAP  P:   Start vanc and zosyn  Send trache asp  ENDOCRINE A:   DM hyperglcyemia P:   SSI. Increase lantus 50 units daily and TF coverage Hold home glipizide, humulin, novolog    Family updated:  Multiple family members updated at bedside 1/31. Family updated at bedside on 2/4.   Interdisciplinary Family Meeting v Palliative Care Meeting:  Due by: 02/04.    I spent  30  minutes of Critical Care time with this patient today.  Pollie MeyerJ. Angelo A de Dios, MD 01/02/2017, 9:23 AM [facility] Pager [phone] After 3 pm or if no answer, call [phone]      Pager [phone] After 3 pm or if no answer, call [phone]

## 2017-01-02 NOTE — Progress Notes (Signed)
Pt temp 100.9 F orally. RN notified  

## 2017-01-03 DIAGNOSIS — J9600 Acute respiratory failure, unspecified whether with hypoxia or hypercapnia: Secondary | ICD-10-CM

## 2017-01-03 LAB — GLUCOSE, CAPILLARY
GLUCOSE-CAPILLARY: 281 mg/dL — AB (ref 65–99)
GLUCOSE-CAPILLARY: 277 mg/dL — AB (ref 65–99)
GLUCOSE-CAPILLARY: 177 mg/dL — AB (ref 65–99)
GLUCOSE-CAPILLARY: 137 mg/dL — AB (ref 65–99)
Glucose-Capillary: 114 mg/dL — ABNORMAL HIGH (ref 65–99)
Glucose-Capillary: 261 mg/dL — ABNORMAL HIGH (ref 65–99)
Glucose-Capillary: 272 mg/dL — ABNORMAL HIGH (ref 65–99)

## 2017-01-03 LAB — BASIC METABOLIC PANEL
Anion gap: 6 (ref 5–15)
BUN: 23 mg/dL — AB (ref 6–20)
CO2: 32 mmol/L (ref 22–32)
CREATININE: 0.68 mg/dL (ref 0.44–1.00)
Calcium: 8.9 mg/dL (ref 8.9–10.3)
Chloride: 103 mmol/L (ref 101–111)
GFR calc Af Amer: >60 mL/min (ref >60)
Glucose, Bld: 279 mg/dL — ABNORMAL HIGH (ref 65–99)
Potassium: 4.1 mmol/L (ref 3.5–5.1)
SODIUM: 141 mmol/L (ref 135–145)

## 2017-01-03 LAB — CBC
HCT: 35 % — ABNORMAL LOW (ref 36.0–46.0)
Hemoglobin: 11.1 g/dL — ABNORMAL LOW (ref 12.0–15.0)
MCH: 28.7 pg (ref 26.0–34.0)
MCHC: 31.7 g/dL (ref 30.0–36.0)
MCV: 90.4 fL (ref 78.0–100.0)
PLATELETS: 224 10*3/uL (ref 150–400)
RBC: 3.87 MIL/uL (ref 3.87–5.11)
RDW: 13.2 % (ref 11.5–15.5)
WBC: 9.2 10*3/uL (ref 4.0–10.5)

## 2017-01-03 LAB — MAGNESIUM: MAGNESIUM: 2.1 mg/dL (ref 1.7–2.4)

## 2017-01-03 LAB — PHOSPHORUS: Phosphorus: 3.9 mg/dL (ref 2.5–4.6)

## 2017-01-03 MED ORDER — METOPROLOL TARTRATE 5 MG/5ML IV SOLN
10.0000 mg | Freq: Two times a day (BID) | INTRAVENOUS | Status: DC
  Administered 2017-01-03: 10 mg via INTRAVENOUS
  Filled 2017-01-03: qty 10

## 2017-01-03 MED ORDER — FENTANYL CITRATE (PF) 100 MCG/2ML IJ SOLN
12.5000 ug | INTRAMUSCULAR | Status: DC | PRN
  Administered 2017-01-03 (×2): 12.5 ug via INTRAVENOUS
  Filled 2017-01-03 (×2): qty 2

## 2017-01-03 MED ORDER — POLYETHYLENE GLYCOL 3350 17 G PO PACK
17.0000 g | PACK | Freq: Every day | ORAL | Status: DC
  Administered 2017-01-05: 17 g via ORAL
  Filled 2017-01-03 (×3): qty 1

## 2017-01-03 MED ORDER — ORAL CARE MOUTH RINSE
15.0000 mL | Freq: Two times a day (BID) | OROMUCOSAL | Status: DC
  Administered 2017-01-03 – 2017-01-04 (×3): 15 mL via OROMUCOSAL

## 2017-01-03 MED ORDER — LORAZEPAM 2 MG/ML IJ SOLN
1.0000 mg | Freq: Two times a day (BID) | INTRAMUSCULAR | Status: DC
  Administered 2017-01-03 (×2): 1 mg via INTRAVENOUS
  Filled 2017-01-03 (×2): qty 1

## 2017-01-03 MED ORDER — CHLORHEXIDINE GLUCONATE 0.12 % MT SOLN
15.0000 mL | Freq: Two times a day (BID) | OROMUCOSAL | Status: DC
  Administered 2017-01-03 – 2017-01-04 (×2): 15 mL via OROMUCOSAL

## 2017-01-03 MED ORDER — METOPROLOL TARTRATE 5 MG/5ML IV SOLN
5.0000 mg | Freq: Four times a day (QID) | INTRAVENOUS | Status: DC
  Administered 2017-01-04: 5 mg via INTRAVENOUS
  Filled 2017-01-03 (×5): qty 5

## 2017-01-03 NOTE — Progress Notes (Signed)
Patient's husband arrived to bedside and began to enter room. Attempted to educate him on contact precaution status and proper hand hygiene and proper use of gowns for MRSA positive contact isolation. Patient's husband became very agitated and began raising his voice saying, "lay off of me, nobody has made me wear these things!" I explained that we would like to protect he and his wife, especially his wife from getting sicker, as she has an active infection with abx therapy. As other family arrived, they also refused to wear proper protective equipment. When they arrived, husband referred to me as "a jerk", as I was explaining the proper contact isolation requirements for visitors. After interaction, I did not further pursue with education, as family stated they were aware, but stated, "I have been around her all my life", and "i'm not wearing it!" Attending physician, Dr. Tyson AliasFeinstein and unit director notified.

## 2017-01-03 NOTE — Procedures (Signed)
Extubation Procedure Note  Patient Details:   Name: Virginia [name] DOB: [birth year] MRN: [id number]   Airway Documentation:     Evaluation  O2 sats: stable throughout Complications: No apparent complications Patient did tolerate procedure well. Bilateral Breath Sounds: Clear, Diminished   Pt extubated per MD order at this time, pt place on 4L [place] tolerating well, pt able to voice with strong cough no distress noted at this time  [name], [name] 01/03/2017, 9:09 AM

## 2017-01-03 NOTE — Progress Notes (Signed)
Notified Speech Therapy in regards to patient's swallowing evaluation. Swallow evaluation should be this afternoon. Will follow up. And will continue to monitor and assess.

## 2017-01-03 NOTE — Progress Notes (Signed)
PULMONARY / CRITICAL CARE MEDICINE   Name: Virginia [name] MRN: [id number] DOB: 1946-04-14    ADMISSION DATE:  12/27/2016 CONSULTATION DATE:  12/27/16  REFERRING MD:  Maryruth Bun   CHIEF COMPLAINT:  Seizures  HISTORY OF PRESENT ILLNESS:  Virginia [name] is a 71 y.o. female with PMH as outlined below. She was taken to [facility] 01/29 for seizures.  Per her husband, she was doing well earlier that morning and was watching TV with him when she suddenly gasped, had eyes roll back, arms flex, and turned blue.  He initiated CPR although no clear answer whether she lost pulses.  EMS arrived and pt was altered then became responsive after sternal rub before becoming combative.  In ED, she remained combative and was intubated for airway protection.  She was then transferred to [facility] for evaluation by neuro.  SUBJECTIVE:   Mental status improved, following commands  Febrile to 101.7 on 2/4 CBGs elevated, likely due to decadron  Plan extubate today  VITAL SIGNS: BP (!) 168/73   Pulse (!) 58   Temp 99.5 F (37.5 C) (Oral)   Resp 17   Ht 5' (1.524 m)   Wt 221 lb 5.5 oz (100.4 kg)   SpO2 96%   BMI 43.23 kg/m   HEMODYNAMICS:    VENTILATOR SETTINGS: Vent Mode: PRVC FiO2 (%):  [40 %] 40 % Set Rate:  [14 bmp] 14 bmp Vt Set:  [460 mL] 460 mL PEEP:  [5 cmH20] 5 cmH20 Pressure Support:  [5 cmH20-10 cmH20] 10 cmH20 Plateau Pressure:  [15 cmH20-18 cmH20] 15 cmH20  INTAKE / OUTPUT: I/O last 3 completed shifts: In: 3677.8 [I.V.:1347; Other:10; NG/GT:1510.8; IV Piggyback:810] Out: 3360 [Urine:3360]   PHYSICAL EXAMINATION: General:  Chronically ill appearing female, NAD on vent. Comfortable. HEENT: MM pink/moist, ETT Neuro:  Awake, alert, responding to commands  CV: s1s2 rrr, no m/r/g PULM: good ae. Dec BS bibasilar.  NW:GNFA, non-tender, bsx4 active  Extremities: warm/dry, no edema noted Skin: no rashes or lesions   LABS:  BMET  Recent Labs Lab 01/01/17 1500 01/02/17 0446  01/03/17 0500  NA 142 142 141  K 4.1 3.9 4.1  CL 104 105 103  CO2 31 30 32  BUN 26* 21* 23*  CREATININE 0.76 0.65 0.68  GLUCOSE 247* 246* 279*    Electrolytes  Recent Labs Lab 01/01/17 0205 01/01/17 1500 01/02/17 0446 01/03/17 0500  CALCIUM 9.0 8.8* 8.5* 8.9  MG 2.1  --  2.2 2.1  PHOS 3.8  --  3.3 3.9    CBC  Recent Labs Lab 01/01/17 0205 01/02/17 0446 01/03/17 0500  WBC 8.8 8.8 9.2  HGB 11.2* 10.5* 11.1*  HCT 35.9* 33.8* 35.0*  PLT 221 206 224    Coag's  Recent Labs Lab 12/27/16 1301  INR 1.11    Sepsis Markers  Recent Labs Lab 12/27/16 1833 12/27/16 2110 12/27/16 2300 12/28/16 0452 12/29/16 0506  LATICACIDVEN 3.1*  --  1.9 1.5  --   PROCALCITON  --  1.71  --  1.63 0.50    ABG  Recent Labs Lab 12/27/16 1640 12/28/16 0410  PHART 7.369 7.433  PCO2ART 44.1 35.6  PO2ART 234* 111*    Liver Enzymes  Recent Labs Lab 12/27/16 1301  AST 46*  ALT 20  ALKPHOS 69  BILITOT 1.3*  ALBUMIN 3.1*    Cardiac Enzymes  Recent Labs Lab 12/27/16 1301 12/27/16 1825 12/28/16 0101  TROPONINI 0.27* 0.47* 0.43*    Glucose  Recent Labs Lab 01/02/17  0801 01/02/17 1155 01/02/17 1619 01/02/17 2015 01/03/17 0022 01/03/17 0409  GLUCAP 314* 280* 281* 324* 261* 281*    Imaging No results found.  STUDIES:  CT head 01/29 (OSH) > no acute process. CXR 01/29 > no acute process. MR brain 1/31>>> 1. Extensive motion artifact.  2. No gross structural abnormality as explanation for seizure. 3. No acute intracranial abnormality.  4. Mild progression and parenchymal volume loss and chronic microvascular ischemic changes of the brain. CXR 2/3 with developing retrocardiac infiltrate   CULTURES: Urine 1/30>>> (-) MRSA PCR positive  Trach Asp 2/2 >> Trach Asp 2/4 >>  ANTIBIOTICS: Rocephin 1/30>>>12/31/2016 Zosyn 2/4 >>> Vanc 2/4 >>>   SIGNIFICANT EVENTS: 01/29 > transfer to [facility]  LINES/TUBES: ETT 01/29 >1/29, 1/30>>>  DISCUSSION: 71 y.o.  female taken to [facility] 01/29 for seizures.  Required intubation in ED then transferred to [facility] for neuro evaluation.   ASSESSMENT / PLAN:  NEUROLOGIC A:   Acute encephalopathy with seizures. Not sure etiology for sze.  New onset Szes.  Chronic Pain and anxiety Hx stroke neuropathy, bulging disks, fibromyalgia, anxiety, depression, right eye bells palsy, chronic pain, right foot drop. P:   RASS goal 0 - -1 Neuro following. Appreciate input! Continue  fentanyl drip at 50 mcg/kg/min. Cont  oxycodone 10 mg BID from TID (was on QID at home) Continue Keppra 500 mg twice daily Cont lorazepam 1 mg BID (was on higher dose at home) Hold off on gabapentin and baclofen.  Waiting to verify SSRI from home  NO MORE PROPOFOL DRIP for this pt. Makes her really sleepy. (synergistic with keppra) Continue Precedex drip prn. - WUA  PULMONARY A: Acute hypoxic respiratory failure - initially intubated for airway protection but failed extubation r/t hypoxia and increased WOB.   Acute pulmonary edema. Improved Concern for aspiration PNA vs pneumonitis with sze.  Continued to have fever with L base infiltrate. Concern for HCAP (early).  P:   Vent support - 8cc/kg. PST trial.  Did well on SBT but no cuff leak.  Stop Decadron as 24 hrs. Anticipate we can extubate in am unless HCAP gets worse.  Continue vanc and zosyn fro HCAP LLL (MRSA nares positive) Cont lasix 20 mg IV q12.  She was on Lasix 40 mg BID at home.   CARDIOVASCULAR A:  Hx HTN, CAD. Pulmonary edema P:  Tele monitoring  cont metoprolol 25mg  BID  Cont lasix to 20 mg IV q 12.   RENAL A:   AGMA - due to lactate- resolved  Pulm edema. improved P:   Urine cx (-) Continue diuresis   GASTROINTESTINAL A:   GI prophylaxis. Nutrition. P:   SUP: Pantoprazole. Continue TF=- hold for weaning Need BM  HEMATOLOGIC A:   VTE Prophylaxis. P:  SCD's / heparin. CBC , limit phlebotomy   INFECTIOUS A:  Asp pneumonitis initially. Concern  for LLL HCAP  P:   Continue vanc and zosyn  Send trach asp - no organism seen on gram stain   ENDOCRINE A:   DM hyperglcyemia P:   SSI. CBGs elevated in the 200s  Increase lantus 50 units daily and TF coverage  Consider an Insulin drip  Hold home glipizide, humulin, novolog    Family updated:  Multiple family members updated at bedside 1/31. Family updated at bedside on 2/4.   Interdisciplinary Family Meeting v Palliative Care Meeting:  Due by: 02/04.  STAFF NOTE: I, Rory Percy, MD FACP have personally reviewed patient's available data, including medical history, events  of note, physical examination and test results as part of my evaluation. I have discussed with resident/NP and other care providers such as pharmacist, RN and RRT. In addition, I personally evaluated patient and elicited key findings of: awakens, follows commands well, lungs cta, reduced left base, no edema, pcxr likely qwith LLL atx/ infiltrate, did have prior fever, in am will narrow if remains culture neg, will need 7 days abx likely, weaning cpap 5 ps5, goal 1 hour, assess RSBi , goal extubation, glu not controlled but T f now off , add mir, neg balance tolerated, mainatin, chem in am , family updated by me, keppra, active WUA The patient is critically ill with multiple organ systems failure and requires high complexity decision making for assessment and support, frequent evaluation and titration of therapies, application of advanced monitoring technologies and extensive interpretation of multiple databases.   Critical Care Time devoted to patient care services described in this note is 30 Minutes. This time reflects time of care of this signee: Rory Percyaniel Kayzen Kendzierski, MD FACP. This critical care time does not reflect procedure time, or teaching time or supervisory time of PA/NP/Med student/Med Resident etc but could involve care discussion time. Rest per NP/medical resident whose note is outlined above and that I  agree with   Mcarthur Rossettianiel J. Tyson AliasFeinstein, MD, FACP Pgr: [phone] [facility] 01/03/2017 8:47 AM

## 2017-01-03 NOTE — Progress Notes (Signed)
Patient is confused and trying to get out of bed, removing equipment, and pulling at her central line. Precedex is going at 0.6 but patient is not responding to medication. Bilateral restraints and belt are ordered and will continue to monitor. Suzy Bouchardhompson, Trea Latner E, [place]RN 01/03/2017 2130

## 2017-01-03 NOTE — Progress Notes (Signed)
eLink Physician-Brief Progress Note Patient Name: Virginia [name] DOB: 12-Dec-1945 MRN: [id number]   Date of Service  01/03/2017  HPI/Events of Note  Getting out of bed, fall risk.  On precedex drip  eICU Interventions  Restraints     Intervention Category Major Interventions: Other:  YACOUB,WESAM 01/03/2017, 9:32 PM

## 2017-01-03 NOTE — Progress Notes (Signed)
Wasted 125 mL fentanyl 257300mcg/250 mL concentration IV bag. Witness by Hazel Samshristian Smith, RN

## 2017-01-03 NOTE — Progress Notes (Signed)
Subjective: Interval History: No new complaints. Extubated this AM and doing well. Tells me she takes Ativan on regular basis and may have missed doses but husband reports that she has not. Denied any neck pain.  Objective: Vital signs in last 24 hours: Temp:  [99.1 F (37.3 C)-100.9 F (38.3 C)] 99.4 F (37.4 C) (02/05 0747) Pulse Rate:  [55-76] 60 (02/05 0820) Resp:  [10-30] 13 (02/05 0820) BP: (121-178)/(53-75) 160/65 (02/05 0820) SpO2:  [91 %-99 %] 93 % (02/05 0908) FiO2 (%):  [40 %] 40 % (02/05 0820) Weight:  [100.4 kg (221 lb 5.5 oz)] 100.4 kg (221 lb 5.5 oz) (02/05 0500)  Intake/Output from previous day: 02/04 0701 - 02/05 0700 In: 2481.7 [I.V.:960.8; NG/GT:815.8; IV Piggyback:705] Out: 2685 [Urine:2685] Intake/Output this shift: Total I/O In: 55.1 [I.V.:55.1] Out: 250 [Urine:250] Nutritional status: Diet NPO time specified  HEENT-  Normocephalic, no lesions, Neck supple and no rigidity was appreciated. Cardiovascular - regular rate and rhythm Lungs - Non labored breathing Heart exam - Regular rate and rhythm  Neurologic Examination: Mental status: Awake alert and oriented to person and place (reports month to be Jan and unable to tell me the year) Speech and language: No evidence of dysarthria was appreciated. Comprehension intact. Naming intact. Fluent. Reading intact. Cranial nerves: Pupils approximately 4 mm and reactive to light. Extra muscles intact. Facial sensation symmetric. No facial droop is appreciated. Hearing intact. Uvula midline. Tongue midline. Motor: 5/5 throughout except right foot drop which is chronic Sensory: Normal sensation to light touch Gait: Deferred  Lab Results:  Recent Labs  01/02/17 0446 01/03/17 0500  WBC 8.8 9.2  HGB 10.5* 11.1*  HCT 33.8* 35.0*  PLT 206 224  NA 142 141  K 3.9 4.1  CL 105 103  CO2 30 32  GLUCOSE 246* 279*  BUN 21* 23*  CREATININE 0.65 0.68  CALCIUM 8.5* 8.9   Lipid Panel No results for input(s): CHOL,  TRIG, HDL, CHOLHDL, VLDL, LDLCALC in the last 72 hours.  Studies/Results: No results found.  Medications:  Scheduled: . aspirin  325 mg Per Tube Daily  . chlorhexidine gluconate (MEDLINE KIT)  15 mL Mouth Rinse BID  . furosemide  20 mg Intravenous BID  . heparin  5,000 Units Subcutaneous Q8H  . insulin aspart  0-20 Units Subcutaneous Q4H  . insulin glargine  50 Units Subcutaneous Daily  . levETIRAcetam  500 mg Intravenous Q12H  . LORazepam  1 mg Oral BID  . mouth rinse  15 mL Mouth Rinse 10 times per day  . metoprolol tartrate  25 mg Per Tube BID  . multivitamin with minerals  1 tablet Per Tube Daily  . oxyCODONE  10 mg Oral Q8H  . pantoprazole (PROTONIX) IV  40 mg Intravenous QHS  . piperacillin-tazobactam (ZOSYN)  IV  3.375 g Intravenous Q8H  . polyethylene glycol  17 g Oral Daily  . vancomycin  750 mg Intravenous Q12H   Continuous: . sodium chloride 10 mL/hr at 01/03/17 0800  . dexmedetomidine Stopped (01/03/17 0805)  . feeding supplement (VITAL HIGH PROTEIN) Stopped (01/03/17 0000)  . fentaNYL infusion INTRAVENOUS Stopped (01/03/17 0805)   BZJ:IRCVEL chloride, albuterol, labetalol, midazolam  Assessment/Plan: 71 years old female with seizures with normal MRI and EEG showing slowing. She is doing really well clinically and has not had any further seizures. Unclear etiology of the seizures and there was some concern of benzos withdrawal symptoms during this admission. I recommend continuing with AEDs for now and possibly a trial  to wean off in 1 year if MRI and EEG remain negative.  -Cont with Keppra 533m BID. -PT/OT therapy for rehabilitation.  Neurology to sign off. Please call with questions.   LOS: 7 days   QRochele Raring

## 2017-01-03 NOTE — Evaluation (Signed)
Clinical/Bedside Swallow Evaluation Patient Details  Name: Virginia [name] MRN: [id number] Date of Birth: 1946-08-25  Today's Date: 01/03/2017 Time: SLP Start Time (ACUTE ONLY): 1335 SLP Stop Time (ACUTE ONLY): 1349 SLP Time Calculation (min) (ACUTE ONLY): 14 min  Past Medical History:  Past Medical History:  Diagnosis Date  . Arthritis   . Diabetes mellitus without complication (HCC)    fasting 120-130s  . Difficulty sleeping    due to back pain  . H/O Bell's palsy left  . History of blood transfusion   . Hypertension   . Spinal stenosis of lumbar region   . Stroke (HCC)    behind rt eye / visual defficiet rt eye   Past Surgical History:  Past Surgical History:  Procedure Laterality Date  . ABDOMINAL HYSTERECTOMY    . BLADDER SURGERY     bladder tack  . CERVICAL SPINE SURGERY     cervical fusion  . CHOLECYSTECTOMY    . CHOLECYSTECTOMY OPEN N/A   . LUMBAR LAMINECTOMY/DECOMPRESSION MICRODISCECTOMY N/A 01/31/2013   Procedure: MICRO LUMBAR DECOMPRESSION L4-5;  Surgeon: Javier Docker, MD;  Location: [facility];  Service: Orthopedics;  Laterality: N/A;  Bilateral Decompression L4-L5  . OOPHORECTOMY    . VAGINAL HYSTERECTOMY     HPI:  Virginia [name] a 71 y.o.femalewith PMH: DM, Bell's Palsy, HTN, stroke, cervical spine surgery. Pt admitted after seizure (watchhing TV with husband when pt suddenly gasped, had eyes roll back, arms flex, and turned blue). Husband initiated CPR. Intubated 1/29-2/5. MRI no acute abnormality. CXR 2/3 developing retrocardiac atelectasis or infiltrate. BSE 12/22/13 recommended regular/thin; pt complained of globus sensation.    Assessment / Plan / Recommendation Clinical Impression  Pt extubated this morning after 7 day intubation and currently with hoarse vocal quality. No overt indications of airway compromise however given pt's confusion, decreased awareness and moderately prolonged intubation, recommend continue NPO status. ST will return tomorrow and  observe with po's to determine initiation of diet/liquids versus objective assessment.     Aspiration Risk  Moderate aspiration risk    Diet Recommendation NPO        Other  Recommendations Oral Care Recommendations: Oral care QID   Follow up Recommendations  (TBD)      Frequency and Duration min 2x/week  2 weeks       Prognosis Prognosis for Safe Diet Advancement: Good      Swallow Study   General HPI: Virginia [name] a 71 y.o.femalewith PMH: DM, Bell's Palsy, HTN, stroke, cervical spine surgery. Pt admitted after seizure (watchhing TV with husband when pt suddenly gasped, had eyes roll back, arms flex, and turned blue). Husband initiated CPR. Intubated 1/29-2/5. MRI no acute abnormality. CXR 2/3 developing retrocardiac atelectasis or infiltrate. BSE 12/22/13 recommended regular/thin; pt complained of globus sensation.  Type of Study: Bedside Swallow Evaluation Previous Swallow Assessment:  (see HPI) Diet Prior to this Study: NPO Temperature Spikes Noted: Yes Respiratory Status: Nasal cannula History of Recent Intubation: Yes Length of Intubations (days): 7 days Date extubated: 01/03/17 Behavior/Cognition: Alert;Cooperative;Pleasant mood;Confused;Requires cueing Oral Cavity Assessment: Dry Oral Care Completed by SLP: No Oral Cavity - Dentition: Adequate natural dentition Vision: Functional for self-feeding Self-Feeding Abilities: Able to feed self;Needs assist Patient Positioning: Upright in bed Baseline Vocal Quality: Hoarse Volitional Cough: Strong Volitional Swallow: Able to elicit    Oral/Motor/Sensory Function Overall Oral Motor/Sensory Function: Within functional limits   Ice Chips Ice chips: Not tested   Thin Liquid Thin Liquid: Impaired Pharyngeal  Phase Impairments:  Suspected delayed Swallow    Nectar Thick Nectar Thick Liquid: Not tested   Honey Thick Honey Thick Liquid: Not tested   Puree Puree: Within functional limits   Solid   GO   Solid: Not  tested        Virginia [name], Virginia [name] 01/03/2017,2:02 PM  Breck CoonsLisa [name] Lonell FaceLitaker M.Ed [employer]-SLP Pager [phone]

## 2017-01-04 DIAGNOSIS — Z978 Presence of other specified devices: Secondary | ICD-10-CM

## 2017-01-04 LAB — CULTURE, RESPIRATORY: SPECIAL REQUESTS: NORMAL

## 2017-01-04 LAB — CULTURE, RESPIRATORY W GRAM STAIN: Culture: NORMAL

## 2017-01-04 LAB — BASIC METABOLIC PANEL
ANION GAP: 12 (ref 5–15)
ANION GAP: 11 (ref 5–15)
BUN: 23 mg/dL — AB (ref 6–20)
BUN: 20 mg/dL (ref 6–20)
CALCIUM: 8.7 mg/dL — AB (ref 8.9–10.3)
CALCIUM: 8.5 mg/dL — AB (ref 8.9–10.3)
CHLORIDE: 101 mmol/L (ref 101–111)
CHLORIDE: 100 mmol/L — AB (ref 101–111)
CO2: 32 mmol/L (ref 22–32)
CO2: 31 mmol/L (ref 22–32)
CREATININE: 0.78 mg/dL (ref 0.44–1.00)
Creatinine, Ser: 0.73 mg/dL (ref 0.44–1.00)
GFR calc non Af Amer: >60 mL/min (ref >60)
GLUCOSE: 179 mg/dL — AB (ref 65–99)
Glucose, Bld: 111 mg/dL — ABNORMAL HIGH (ref 65–99)
Potassium: 2.8 mmol/L — ABNORMAL LOW (ref 3.5–5.1)
Potassium: 2.9 mmol/L — ABNORMAL LOW (ref 3.5–5.1)
SODIUM: 145 mmol/L (ref 135–145)
Sodium: 142 mmol/L (ref 135–145)

## 2017-01-04 LAB — GLUCOSE, CAPILLARY
Glucose-Capillary: 169 mg/dL — ABNORMAL HIGH (ref 65–99)
Glucose-Capillary: 141 mg/dL — ABNORMAL HIGH (ref 65–99)
Glucose-Capillary: 164 mg/dL — ABNORMAL HIGH (ref 65–99)
Glucose-Capillary: 145 mg/dL — ABNORMAL HIGH (ref 65–99)
Glucose-Capillary: 133 mg/dL — ABNORMAL HIGH (ref 65–99)

## 2017-01-04 LAB — PHOSPHORUS: Phosphorus: 4.2 mg/dL (ref 2.5–4.6)

## 2017-01-04 LAB — MAGNESIUM: Magnesium: 2 mg/dL (ref 1.7–2.4)

## 2017-01-04 MED ORDER — FENTANYL 25 MCG/HR TD PT72
25.0000 ug | MEDICATED_PATCH | TRANSDERMAL | Status: DC
  Administered 2017-01-04: 25 ug via TRANSDERMAL
  Filled 2017-01-04 (×2): qty 1

## 2017-01-04 MED ORDER — SODIUM CHLORIDE 0.9 % IV SOLN
30.0000 meq | INTRAVENOUS | Status: DC
  Filled 2017-01-04: qty 15

## 2017-01-04 MED ORDER — POTASSIUM CHLORIDE 20 MEQ/15ML (10%) PO SOLN: 40.0000 meq | ORAL | Status: DC

## 2017-01-04 MED ORDER — POTASSIUM CHLORIDE CRYS ER 20 MEQ PO TBCR
40.0000 meq | EXTENDED_RELEASE_TABLET | ORAL | Status: AC
  Administered 2017-01-04 – 2017-01-05 (×2): 40 meq via ORAL
  Filled 2017-01-04 (×2): qty 2

## 2017-01-04 MED ORDER — INSULIN GLARGINE 100 UNIT/ML ~~LOC~~ SOLN: 50.0000 [IU] | Freq: Every day | SUBCUTANEOUS | Status: DC

## 2017-01-04 MED ORDER — LEVETIRACETAM 500 MG PO TABS
500.0000 mg | ORAL_TABLET | Freq: Two times a day (BID) | ORAL | Status: DC
  Administered 2017-01-04 – 2017-01-05 (×2): 500 mg via ORAL
  Filled 2017-01-04 (×2): qty 1

## 2017-01-04 MED ORDER — INSULIN GLARGINE 100 UNIT/ML ~~LOC~~ SOLN
50.0000 [IU] | Freq: Every day | SUBCUTANEOUS | Status: DC
  Administered 2017-01-05: 50 [IU] via SUBCUTANEOUS
  Filled 2017-01-04 (×2): qty 0.5

## 2017-01-04 MED ORDER — HYDRALAZINE HCL 20 MG/ML IJ SOLN: 10.0000 mg | INTRAMUSCULAR | Status: DC | PRN

## 2017-01-04 MED ORDER — POTASSIUM CHLORIDE 20 MEQ/15ML (10%) PO SOLN
40.0000 meq | ORAL | Status: DC
Start: 2017-01-04 — End: 2017-01-04

## 2017-01-04 MED ORDER — ASPIRIN 325 MG PO TABS
325.0000 mg | ORAL_TABLET | Freq: Every day | ORAL | Status: DC
  Administered 2017-01-05: 325 mg via ORAL
  Filled 2017-01-04: qty 1

## 2017-01-04 MED ORDER — ORAL CARE MOUTH RINSE
15.0000 mL | Freq: Two times a day (BID) | OROMUCOSAL | Status: DC
  Administered 2017-01-05: 15 mL via OROMUCOSAL

## 2017-01-04 MED ORDER — POTASSIUM CHLORIDE 20 MEQ/15ML (10%) PO SOLN
30.0000 meq | Freq: Once | ORAL | Status: AC
  Administered 2017-01-04: 30 meq via ORAL
  Filled 2017-01-04: qty 30

## 2017-01-04 MED ORDER — SODIUM CHLORIDE 0.9 % IV SOLN
30.0000 meq | INTRAVENOUS | Status: DC
  Administered 2017-01-04: 30 meq via INTRAVENOUS
  Filled 2017-01-04 (×2): qty 15

## 2017-01-04 MED ORDER — MIDAZOLAM HCL 2 MG/2ML IJ SOLN
1.0000 mg | INTRAMUSCULAR | Status: DC | PRN
  Administered 2017-01-04: 1 mg via INTRAVENOUS
  Filled 2017-01-04 (×2): qty 2

## 2017-01-04 MED ORDER — ADULT MULTIVITAMIN W/MINERALS CH
1.0000 | ORAL_TABLET | Freq: Every day | ORAL | Status: DC
  Administered 2017-01-05: 1 via ORAL
  Filled 2017-01-04: qty 1

## 2017-01-04 MED ORDER — METOPROLOL TARTRATE 25 MG PO TABS
25.0000 mg | ORAL_TABLET | Freq: Two times a day (BID) | ORAL | Status: DC
  Administered 2017-01-04 – 2017-01-05 (×2): 25 mg via ORAL
  Filled 2017-01-04 (×2): qty 1

## 2017-01-04 NOTE — Progress Notes (Signed)
[facility] FOR AM LAB REPLACEMENT ONLY  The patient does apply for the [facility] Protocol based on the criteria listed below:   1. Is GFR >/= 40 ml/min? Yes.    Patient's GFR today is >60 2. Is urine output >/= 0.5 ml/kg/hr for the last 6 hours? Yes.   Patient's UOP is 0.51 ml/kg/hr 3. Is BUN < 60 mg/dL? Yes.    Patient's BUN today is 23 4. Abnormal electrolyte  K 2.8 5. Ordered repletion with: per protocol 6. If a panic level lab has been reported, has the CCM MD in charge been notified? Yes.  .   Physician:  Bradd BurnerMannam  Derrian Poli McEachran 01/04/2017 5:41 AM

## 2017-01-04 NOTE — Progress Notes (Signed)
Speech Language Pathology Treatment: Dysphagia  Patient Details Name: Virginia [name] MRN: [id number] DOB: Dec 14, 1945 Today's Date: 01/04/2017 Time: 0388-8280 SLP Time Calculation (min) (ACUTE ONLY): 17 min  Assessment / Plan / Recommendation Clinical Impression  Virginia [name] was NPO prior to dysphagia treatment due to recent extubation; oral care was provided prior to PO consumption. Observed pt with thin liquids via cup/straw and regular solids, with no indications or s/s of airway compromise at bedside. SLP educated and reminded pt to continue with small bites/sips. Recommend initiating diet: regular solids, thin liquids (straws ok), whole meds with liquid. ST treatment not needed at this time-signing off.   HPI HPI: Virginia [name] a 71 y.o.femalewith PMH: DM, Bell's Palsy, HTN, stroke, cervical spine surgery. Pt admitted after seizure (watchhing TV with husband when pt suddenly gasped, had eyes roll back, arms flex, and turned blue). Husband initiated CPR. Intubated 1/29-2/5. MRI no acute abnormality. CXR 2/3 developing retrocardiac atelectasis or infiltrate. BSE 12/22/13 recommended regular/thin; pt complained of globus sensation.       SLP Plan  Discharge SLP treatment due to (comment);All goals met (goals met)     Recommendations  Diet recommendations: Regular;Thin liquid Liquids provided via: Cup;Straw Medication Administration: Whole meds with liquid Supervision: Intermittent supervision to cue for compensatory strategies;Patient able to self feed Compensations: Slow rate;Small sips/bites Postural Changes and/or Swallow Maneuvers: Seated upright 90 degrees                Oral Care Recommendations: Oral care BID Follow up Recommendations: Other (comment) (TBD) Plan: Discharge SLP treatment due to (comment);All goals met (goals met)       GO                Fransisca Kaufmann , Student-SLP 01/04/2017, 10:37 AM

## 2017-01-04 NOTE — Progress Notes (Signed)
PULMONARY / CRITICAL CARE MEDICINE   Name: Virginia GalaKay C Sherod MRN: [id number] DOB: 1945-11-30    ADMISSION DATE:  12/27/2016 CONSULTATION DATE:  12/27/16  REFERRING MD:  Maryruth BunMorehead   CHIEF COMPLAINT:  Seizures  HISTORY OF PRESENT ILLNESS:  Virginia [name] is a 71 y.o. female with PMH as outlined below. [facility].  Per her husband, she was doing well earlier that morning and was watching TV with him when she suddenly gasped, had eyes roll back, arms flex, and turned blue.  He initiated CPR although no clear answer whether she lost pulses.  EMS arrived and pt was altered then became responsive after sternal rub before becoming combative.  In ED, she remained combative and was intubated for airway protection.  [facility].  SUBJECTIVE:   Confused and combative overnight, restraints applied  On precedex drip  Remains afebrile Alert and orientated this morning to name, place, not date.    VITAL SIGNS: BP (!) 168/75   Pulse (!) 53   Temp 98.1 F (36.7 C) (Oral)   Resp 18   Ht 5' (1.524 m)   Wt 216 lb 0.8 oz (98 kg)   SpO2 98%   BMI 42.19 kg/m   HEMODYNAMICS:    VENTILATOR SETTINGS: Vent Mode: CPAP;PSV FiO2 (%):  [40 %] 40 % PEEP:  [5 cmH20] 5 cmH20 Pressure Support:  [5 cmH20] 5 cmH20  INTAKE / OUTPUT: I/O last 3 completed shifts: In: 2462.8 [I.V.:1122.8; Other:40; NG/GT:275; IV Piggyback:1025] Out: 3110 [Urine:3110]   PHYSICAL EXAMINATION: General:  Chronically ill appearing female, NAD. Comfortable. [place] 3 L  HEENT: MM pink/moist, Neuro:  Awake, alert, responding to commands  CV: s1s2 rrr, no m/r/g PULM: good ae.CTAB WU:JWJXGI:soft, non-tender, bsx4 active  Extremities: warm/dry, no edema noted Skin: no rashes or lesions   LABS:  BMET  Recent Labs Lab 01/02/17 0446 01/03/17 0500 01/04/17 0432  NA 142 141 145  K 3.9 4.1 2.8*  CL 105 103 101  CO2 30 32 32  BUN 21* 23* 23*   CREATININE 0.65 0.68 0.78  GLUCOSE 246* 279* 179*    Electrolytes  Recent Labs Lab 01/02/17 0446 01/03/17 0500 01/04/17 0432  CALCIUM 8.5* 8.9 8.7*  MG 2.2 2.1 2.0  PHOS 3.3 3.9 4.2    CBC  Recent Labs Lab 01/01/17 0205 01/02/17 0446 01/03/17 0500  WBC 8.8 8.8 9.2  HGB 11.2* 10.5* 11.1*  HCT 35.9* 33.8* 35.0*  PLT 221 206 224    Coag's No results for input(s): APTT, INR in the last 168 hours.  Sepsis Markers  Recent Labs Lab 12/29/16 0506  PROCALCITON 0.50    ABG No results for input(s): PHART, PCO2ART, PO2ART in the last 168 hours.  Liver Enzymes No results for input(s): AST, ALT, ALKPHOS, BILITOT, ALBUMIN in the last 168 hours.  Cardiac Enzymes No results for input(s): TROPONINI, PROBNP in the last 168 hours.  Glucose  Recent Labs Lab 01/03/17 0744 01/03/17 1133 01/03/17 1544 01/03/17 1958 01/03/17 2354 01/04/17 0355  GLUCAP 272* 277* 177* 137* 114* 169*    Imaging No results found.  STUDIES:  CT head 01/29 (OSH) > no acute process. CXR 01/29 > no acute process. MR brain 1/31>>> 1. Extensive motion artifact.  2. No gross structural abnormality as explanation for seizure. 3. No acute intracranial abnormality.  4. Mild progression and parenchymal volume loss and chronic microvascular ischemic changes of the brain. CXR 2/3  with developing retrocardiac infiltrate   CULTURES: Urine 1/30>>> (-) MRSA PCR positive  Trach Asp 2/2 >> Trach Asp 2/4 >>  ANTIBIOTICS: Rocephin 1/30>>>12/31/2016 Zosyn 2/4 >>>2/6 Vanc 2/4 >>>2/6   SIGNIFICANT EVENTS: 01/29 > transfer to [facility]  LINES/TUBES: ETT 01/29 >1/29, 1/30>>>2/5  DISCUSSION: 71 y.o. female taken to [facility] 01/29 for seizures.  Required intubation in ED then transferred to [facility] for neuro evaluation.   ASSESSMENT / PLAN:  NEUROLOGIC A:   Acute encephalopathy with seizures. Not sure etiology for sze.  New onset Szes.  Chronic Pain and anxiety Hx stroke neuropathy, bulging disks,  fibromyalgia, anxiety, depression, right eye bells palsy, chronic pain, right foot drop. P:   Neuro following.  Started fentanyl 12.5 mcg every 2 hours prn , add fent patch(home)  Cont  oxycodone 10 mg TID (was on QID at home) Continue Keppra 500 mg twice daily Cont lorazepam 1 mg BID (was on higher dose at home) Hold off on gabapentin and baclofen.  Start when orals okay  Precedex drip, weaning down to goal off PT/OT eval Start home ssri, prn versed is only choice   PULMONARY A: Acute hypoxic respiratory failure - initially intubated for airway protection but failed extubation r/t hypoxia and increased WOB.   Acute pulmonary edema. Improved Concern for aspiration PNA vs pneumonitis with sze.  Continued to have fever with L base infiltrate. Concern for HCAP (early).  P:   Cont lasix 20 mg IV q12.  She was on Lasix 40 mg BID at home.   CARDIOVASCULAR A:  Hx HTN, CAD. Pulmonary edema Bradycardic (precedex) P:  Tele monitoring  cont metoprolol IV 5 mg QID (metropolol 25 mg BID at home)  Consider decreasing Metropolol if brady remains after precedex off Consider adding back HCTZ for HTN  Cont lasix to 20 mg IV q 12.  Add prn hydralazine  RENAL A:   AGMA - due to lactate- resolved  Pulm edema. improved P:   Urine cx (-) Continue diuresis  Hypokalemia - repleted x 2  5 pm BMET   GASTROINTESTINAL A:   GI prophylaxis. Nutrition. P:   Miralax as needed  SUP: Pantoprazole. Speech today   HEMATOLOGIC A:   VTE Prophylaxis. P:  SCD's / heparin  INFECTIOUS A:  Asp pneumonitis initially. Concern for LLL HCAP  P:   dc vanc and zosyn  Unclear why she had fevers over weekend,. No fevers x 3 days  ENDOCRINE A:   DM hyperglcyemia P:   NPO currently  Continue  50 units of Lantus and SSI Hold home glipizide, humulin, novolog    Family updated:   Family updated at bedside on 2/5.   Interdisciplinary Family Meeting v Palliative Care Meeting:  Due by:  02/04.  STAFF NOTE: I, Rory Percy, MD FACP have personally reviewed patient's available data, including medical history, events of note, physical examination and test results as part of my evaluation. I have discussed with resident/NP and other care providers such as pharmacist, RN and RRT. In addition, I personally evaluated patient and elicited key findings of: awake, fc, int agitation resolving, lungs clear, abdo soft, add oral meds when able for neuro home meds, no ativan as may have had paradoxical repsonse, no haldol with seizures, add home ssri soon, if fails slp then place NGT and feed, add prn versed, HTN at times, add hydral with associated slight brady, dc precedex , frequent orientation, K supp, re assess bmet , lasix doing well with that, maintain, no fevers x  72 hours, no clear infection now dc abx and observe, I updated husband in full at bedside The patient is critically ill with multiple organ systems failure and requires high complexity decision making for assessment and support, frequent evaluation and titration of therapies, application of advanced monitoring technologies and extensive interpretation of multiple databases.   Critical Care Time devoted to patient care services described in this note is 30 Minutes. This time reflects time of care of this signee: Rory Percy, MD FACP. This critical care time does not reflect procedure time, or teaching time or supervisory time of PA/NP/Med student/Med Resident etc but could involve care discussion time. Rest per NP/medical resident whose note is outlined above and that I agree with   Mcarthur Rossetti. Tyson Alias, MD, FACP Pgr: [phone] [facility] 01/04/2017 9:53 AM

## 2017-01-04 NOTE — Evaluation (Signed)
Physical Therapy Evaluation Patient Details Name: Virginia [name] MRN: [id number] DOB: 04/27/1946 Today's Date: 01/04/2017   History of Present Illness  Pt is a 71 yo female admitted from [place] for seizures. PMH: DM, arthritis, h/o L bell's palsy, stroke. PSH: hysterectomy, c-spine surgery, lumbar lami/decompression.  Clinical Impression  Pt tolerated OOB mobility well for first time. Pt strongly desires to return home with spouse however currently requires min/modA for all mobility and ADLs. Pt was indep PTA.  If pt con't to progress well during hospital pt has potential to achieve supervision level of function and would then be able to transition home with spouse. Acute PT to follow and con't to assess for d/c recs.    Follow Up Recommendations SNF;Supervision/Assistance - 24 hour (may progress to supervision and be safe for home)    Equipment Recommendations  None recommended by PT    Recommendations for Other Services       Precautions / Restrictions Precautions Precautions: Fall Restrictions Weight Bearing Restrictions: No      Mobility  Bed Mobility Overal bed mobility: Needs Assistance Bed Mobility: Rolling;Sidelying to Sit Rolling: Min guard Sidelying to sit: Min assist;Mod assist       General bed mobility comments: assist for trunk elevation, used bed rail, HOB elevated  Transfers Overall transfer level: Needs assistance Equipment used: Rolling walker (2 wheeled) Transfers: Sit to/from Stand Sit to Stand: Mod assist;+2 safety/equipment         General transfer comment: v/c's for safe hand placement, modA for initial power up and to steady during transition of hands from bed to RW  Ambulation/Gait Ambulation/Gait assistance: Mod assist;+2 safety/equipment Ambulation Distance (Feet): 30 Feet Assistive device: Rolling walker (2 wheeled) Gait Pattern/deviations: Step-through pattern;Decreased stride length;Decreased step length - left;Decreased stance time  - right;Decreased weight shift to right Gait velocity: slow Gait velocity interpretation: Below normal speed for age/gender General Gait Details: decreased step height on the R due to h/o drop foot, minA for walker management  Stairs            Wheelchair Mobility    Modified Rankin (Stroke Patients Only)       Balance Overall balance assessment: Needs assistance Sitting-balance support: Single extremity supported;Feet supported Sitting balance-Leahy Scale: Fair     Standing balance support: Bilateral upper extremity supported Standing balance-Leahy Scale: Poor Standing balance comment: requies RW right now                             Pertinent Vitals/Pain Pain Assessment: No/denies pain    Home Living Family/patient expects to be discharged to:: Private residence Living Arrangements: Spouse/significant other Available Help at Discharge: Family;Available 24 hours/day Type of Home: House Home Access: Stairs to enter Entrance Stairs-Rails: None Entrance Stairs-Number of Steps: 3 Home Layout: One level Home Equipment: Walker - 2 wheels;Cane - single point      Prior Function Level of Independence: Independent with assistive device(s)         Comments: used RW, spouse assist on stairs, pt reports doing dressing and bathing on her own     Hand Dominance        Extremity/Trunk Assessment   Upper Extremity Assessment Upper Extremity Assessment: Generalized weakness    Lower Extremity Assessment Lower Extremity Assessment: Generalized weakness    Cervical / Trunk Assessment Cervical / Trunk Assessment: Normal  Communication   Communication: No difficulties  Cognition Arousal/Alertness: Awake/alert Behavior During Therapy: WFL for tasks assessed/performed  Overall Cognitive Status:  (pt re-oriented to year, delayed processing)                      General Comments      Exercises     Assessment/Plan    PT Assessment Patient  needs continued PT services  PT Problem List Decreased strength;Decreased range of motion;Decreased activity tolerance;Decreased balance;Decreased mobility          PT Treatment Interventions DME instruction;Gait training;Stair training;Functional mobility training;Therapeutic activities;Balance training;Therapeutic exercise    PT Goals (Current goals can be found in the Care Plan section)  Acute Rehab PT Goals Patient Stated Goal: home PT Goal Formulation: With patient Time For Goal Achievement: 01/18/17 Potential to Achieve Goals: Good    Frequency Min 3X/week   Barriers to discharge        Co-evaluation               End of Session Equipment Utilized During Treatment: Gait belt Activity Tolerance: Patient tolerated treatment well Patient left: in chair;with call bell/phone within reach;with chair alarm set Nurse Communication: Mobility status         Time: [id number] PT Time Calculation (min) (ACUTE ONLY): 35 min   Charges:   PT Evaluation $PT Eval Moderate Complexity: 1 Procedure PT Treatments $Gait Training: 8-22 mins   PT G Codes:        Margareta Laureano M Avon Molock 01/04/2017, 3:08 PM   Lewis ShockAshly Denney Shein, PT, DPT Pager #: [phone] Office #: [phone]

## 2017-01-05 LAB — BASIC METABOLIC PANEL
ANION GAP: 12 (ref 5–15)
BUN: 14 mg/dL (ref 6–20)
CHLORIDE: 97 mmol/L — AB (ref 101–111)
CO2: 29 mmol/L (ref 22–32)
CREATININE: 0.69 mg/dL (ref 0.44–1.00)
Calcium: 8.3 mg/dL — ABNORMAL LOW (ref 8.9–10.3)
GFR calc non Af Amer: >60 mL/min (ref >60)
Glucose, Bld: 134 mg/dL — ABNORMAL HIGH (ref 65–99)
POTASSIUM: 3.3 mmol/L — AB (ref 3.5–5.1)
SODIUM: 138 mmol/L (ref 135–145)

## 2017-01-05 LAB — GLUCOSE, CAPILLARY
GLUCOSE-CAPILLARY: 107 mg/dL — AB (ref 65–99)
GLUCOSE-CAPILLARY: 198 mg/dL — AB (ref 65–99)
Glucose-Capillary: 132 mg/dL — ABNORMAL HIGH (ref 65–99)
Glucose-Capillary: 136 mg/dL — ABNORMAL HIGH (ref 65–99)

## 2017-01-05 LAB — PHOSPHORUS: PHOSPHORUS: 3.9 mg/dL (ref 2.5–4.6)

## 2017-01-05 LAB — MAGNESIUM: MAGNESIUM: 2.1 mg/dL (ref 1.7–2.4)

## 2017-01-05 MED ORDER — FENTANYL 25 MCG/HR TD PT72: 25.0000 ug | MEDICATED_PATCH | TRANSDERMAL | 0 refills | Status: AC

## 2017-01-05 MED ORDER — LEVETIRACETAM 500 MG PO TABS
500.0000 mg | ORAL_TABLET | Freq: Two times a day (BID) | ORAL | 2 refills | Status: AC
Start: 2017-01-05 — End: ?

## 2017-01-05 NOTE — Care Management Note (Signed)
Case Management Note  Patient Details  Name: Virginia [name] MRN: [id number] Date of Birth: [birth year]  Subjective/Objective:   [facility] encephalopathy, seizures, chronic pain, anxiety, acute resp failure, htn.  NCM spoke with patient and spouse, they state they do not need HH services states she has a walker, cane, bsc, w/chair at home.  States she has had HH with [facility] in the past but right now does not need HH services.  She is for dc today, per pt note states, is she conts to progrss well during hospital stay she has potential to achieve supervison level  And would be able to go home with spouse.  She has a PCP also and no problem with getting medications and she has transportation.  She is for dc today.                    Action/Plan:   Expected Discharge Date:  01/05/17               Expected Discharge Plan:  Home w Home Health Services  In-House Referral:  Clinical Social Work  Discharge planning Services  CM Consult  Post Acute Care Choice:    Choice offered to:     DME Arranged:    DME Agency:     HH Arranged:    HH Agency:     Status of Service:  Completed, signed off  If discussed at [employer] Meetings, dates discussed:    Additional Comments:  Leone Havenaylor, Ivy Meriwether Clinton, RN 01/05/2017, 12:39 PM

## 2017-01-05 NOTE — Progress Notes (Signed)
Discussed and explained discharge instructions, follow up appointment husband wants to schedule them when he gets home.  Prescriptions given to husband. Pt going home with husband via w/c with belongings.

## 2017-01-05 NOTE — Evaluation (Signed)
Occupational Therapy Evaluation Patient Details Name: Virginia [name] MRN: [id number] DOB: 07/03/1946 Today's Date: 01/05/2017    History of Present Illness Pt is a 71 yo female admitted from [facility] for seizures. PMH: DM, arthritis, h/o L bell's palsy, stroke. PSH: hysterectomy, c-spine surgery, lumbar lami/decompression.   Clinical Impression   Pt was assisted for LB bathing and dressing, tub transfers and all IADL prior to admission. She walked with a walker. Pt eager to get OOB today. No physical assist needed for bed mobility or transfers with RW this visit. VSS throughout session. Pt with delayed processing and needs close supervision. Husband observing session and can manage pt at home. Recommending home with HHOT. Will follow acutely.    Follow Up Recommendations  Home health OT;Supervision/Assistance - 24 hour    Equipment Recommendations       Recommendations for Other Services       Precautions / Restrictions Precautions Precautions: Fall Restrictions Weight Bearing Restrictions: No      Mobility Bed Mobility Overal bed mobility: Needs Assistance Bed Mobility: Supine to Sit     Supine to sit: Supervision     General bed mobility comments: HOB up, heavy use of rail, no physical assist  Transfers Overall transfer level: Needs assistance Equipment used: Rolling walker (2 wheeled) Transfers: Sit to/from [employer] Sit to Stand: Min guard Stand pivot transfers: Min guard       General transfer comment: cues for hand placement    Balance Overall balance assessment: Needs assistance   Sitting balance-Leahy Scale: Good       Standing balance-Leahy Scale: Poor Standing balance comment: requires at least one hand support in standing for balance                            ADL Overall ADL's : Needs assistance/impaired Eating/Feeding: Independent;Bed level   Grooming: Wash/dry hands;Wash/dry face;Sitting;Set up   Upper  Body Bathing: Minimal assistance;Sitting   Lower Body Bathing: Minimal assistance;Sit to/from stand   Upper Body Dressing : Set up;Sitting   Lower Body Dressing: Minimal assistance;Sit to/from stand   Toilet Transfer: Min guard;Stand-pivot;BSC;RW   Toileting- Architect and Hygiene: Minimal assistance;Sit to/from stand       Functional mobility during ADLs: Engineer, technical sales     Praxis      Pertinent Vitals/Pain Pain Assessment: Faces Faces Pain Scale: Hurts a little bit Pain Location: back Pain Descriptors / Indicators: Sore;Constant Pain Intervention(s): Monitored during session;Repositioned     Hand Dominance Right   Extremity/Trunk Assessment Upper Extremity Assessment Upper Extremity Assessment: Generalized weakness   Lower Extremity Assessment Lower Extremity Assessment: Generalized weakness   Cervical / Trunk Assessment Cervical / Trunk Assessment:  (hx of multiple back surgeries, chronic back pain)   Communication Communication Communication: No difficulties   Cognition Arousal/Alertness: Awake/alert Behavior During Therapy: Flat affect Overall Cognitive Status: Impaired/Different from baseline Area of Impairment: Problem solving             Problem Solving: Slow processing General Comments: likely baseline   General Comments       Exercises       Shoulder Instructions      Home Living Family/patient expects to be discharged to:: Private residence Living Arrangements: Spouse/significant other Available Help at Discharge: Family;Available 24 hours/day Type of Home: House Home Access: Stairs to enter [employer] of Steps: 3 Entrance  Stairs-Rails: None Home Layout: One level     Bathroom Shower/Tub: Chief Strategy OfficerTub/shower unit   Bathroom Toilet: Standard     Home Equipment: Environmental consultantWalker - 2 wheels;Cane - single point;Bedside commode;Shower seat          Prior Functioning/Environment  Level of Independence: Needs assistance  Gait / Transfers Assistance Needed: ambulates with walker ADL's / Homemaking Assistance Needed: husband assists with shower transfer, LB dressing and IADL            OT Problem List: Decreased strength;Decreased activity tolerance;Impaired balance (sitting and/or standing);Decreased cognition;Decreased knowledge of use of DME or AE;Obesity;Pain   OT Treatment/Interventions: Self-care/ADL training;DME and/or AE instruction;Therapeutic activities;Patient/family education;Balance training;Cognitive remediation/compensation    OT Goals(Current goals can be found in the care plan section) Acute Rehab OT Goals Patient Stated Goal: home OT Goal Formulation: With patient Time For Goal Achievement: 01/12/17 Potential to Achieve Goals: Good ADL Goals Pt Will Perform Grooming: with supervision;standing Pt Will Transfer to Toilet: with supervision;ambulating Pt Will Perform Toileting - Clothing Manipulation and hygiene: with supervision;sit to/from stand Pt Will Perform Tub/Shower Transfer: with min guard assist;ambulating;shower seat;rolling walker  OT Frequency: Min 2X/week   Barriers to D/C:            Co-evaluation              End of Session Equipment Utilized During Treatment: Rolling walker;Gait belt Nurse Communication: Mobility status  Activity Tolerance: Patient tolerated treatment well Patient left: in chair;with call bell/phone within reach;with chair alarm set;with nursing/sitter in room;with family/visitor present   Time: [id number] OT Time Calculation (min): 17 min Charges:  OT General Charges $OT Visit: 1 Procedure OT Evaluation $OT Eval Moderate Complexity: 1 Procedure G-Codes:    Evern BioMayberry, Kairos Panetta Lynn 01/05/2017, 9:29 AM  [phone]

## 2017-01-05 NOTE — Clinical Social Work Note (Signed)
Patient and her husband prefer for her to go home rather than SNF.  CSW signing off.  Charlynn CourtSarah Dianah Pruett, CSW [phone]

## 2017-01-05 NOTE — Care Management Important Message (Signed)
Important Message  Patient Details  Name: Virginia [name] MRN: [id number] Date of Birth: 1946-06-15   Medicare Important Message Given:  Yes    Leone Havenaylor, Amiya Escamilla Clinton, RN 01/05/2017, 12:52 PMImportant Message  Patient Details  Name: Virginia [name] MRN: [id number] Date of Birth: 1946-06-15   Medicare Important Message Given:  Yes    Leone Havenaylor, Labradford Schnitker Clinton, RN 01/05/2017, 12:51 PM

## 2017-01-05 NOTE — Discharge Summary (Signed)
Physician Discharge Summary  Virginia Virginia [name] UEA:[id number] DOB: November 15, 1946 DOA: 12/27/2016  PCP: Selinda Flavin, MD  Admit date: 12/27/2016 Discharge date: 01/05/2017  Admitted From: Home Discharge disposition: Home   Recommendations for Outpatient Follow-Up:   1. The patient is being discharged on Keppra for new onset seizure. 2. Recommend PCP wean multiple sedating/controlled substances due to polypharmacy   Discharge Diagnosis:   Principal Problem:    Seizure (HCC) Active Problems:    Acute respiratory failure (HCC)    Encounter for central line placement    Encounter for orogastric tube placement    Acute pulmonary edema (HCC)    AKI (acute kidney injury) (HCC)    HCAP (healthcare-associated pneumonia)    Endotracheally intubated    Polypharmacy    Hypokalemia    HTN    CAD  Discharge Condition: Improved.  Diet recommendation: Low sodium, heart healthy.    History of Present Illness:   71 year old Virginia [name] admitted for evaluation of a seizure, subsequently developed delirium and VDRF.   Hospital Course by Problem:   Principal Problem:   Seizure (HCC) in the setting of chronic benzodiazepine use and polypharmacy with opiates MRI normal.  EEG showed slowing but no epileptic focus.  Possibly related to benzodiazepine withdrawal (takes high dose of Ativan).  Evaluated by neurologist with recommendations to continue Keppra x 1 year, with weaning then if MRI and EEG remain negative. Fentanyl patch reduced from 75 to 25 mcg.  Active Problems:   Acute respiratory failure (HCC)/Acute pulmonary edema Related to seizure.  Extubated 01/03/17.    AKI (acute kidney injury) (HCC) Resolved.    HCAP (healthcare-associated pneumonia) Received antibiotics from 12/28/16---01/04/17.    HTN Continue metoprolol and lisinopril.    CAD Continue ASA.    Hypokalemia Repleted.     Medical Consultants:    Critical Care  Neurology   Discharge Exam:   Vitals:     01/05/17 0740 01/05/17 1153  BP: (!) 157/65 (!) 143/[place]ia  Pulse: 80 65  Resp: (!) 25 (!) 23  Temp: 98.7 F (37.1 C) 98.3 F (36.8 C)   Vitals:   01/05/17 0446 01/05/17 0528 01/05/17 0740 01/05/17 1153  BP:  (!) 160/55 (!) 157/65 (!) 143/[place]ia  Pulse: 76 73 80 65  Resp: (!) 23 (!) 24 (!) 25 (!) 23  Temp: 97.5 F (36.4 C)  98.7 F (37.1 C) 98.3 F (36.8 C)  TempSrc: Oral  Oral Oral  SpO2: 96% 95% 94% 94%  Weight:      Height:        General exam: Appears calm and comfortable.  Respiratory system: Clear to auscultation. Respiratory effort normal. Cardiovascular system: S1 & S2 heard, RRR. No JVD,  rubs, gallops or clicks. No murmurs. Gastrointestinal system: Abdomen is nondistended, soft and nontender. No organomegaly or masses felt. Normal bowel sounds heard. Central nervous system: Alert and oriented. No focal neurological deficits. Extremities: No clubbing,  or cyanosis. No edema. Skin: No rashes, lesions or ulcers. Psychiatry: Judgement and insight appear normal. Mood & affect appropriate.    The results of significant diagnostics from this hospitalization (including imaging, microbiology, ancillary and laboratory) are listed below for reference.     Procedures and Diagnostic Studies:   Mr Brain Wo Contrast  Result Date: 12/28/2016 CLINICAL DATA:  71 y/o  F; seizure. EXAM: MRI HEAD WITHOUT CONTRAST TECHNIQUE: Multiplanar, multiecho pulse sequences of the brain and surrounding structures were obtained without intravenous contrast. COMPARISON:  12/20/2013 MRI of the brain. FINDINGS:  Brain: No diffusion signal abnormality. T2 FLAIR hyperintense signal abnormality in periventricular and subcortical white matter is nonspecific but probably represents moderate chronic microvascular ischemic changes and is mildly progressed. Mild progression of parenchymal volume loss. No focal mass effect. No extra-axial collection. Ventricle size is commensurate degree of volume loss. Punctate foci  of susceptibility hypointensity within the left parietal lobe and left cerebral peduncle likely represents hemosiderin deposition of remote microhemorrhage. No gross structural abnormality. No definite asymmetry in hippocampal size or signal. Vascular: Normal flow voids. Skull and upper cervical spine: Normal marrow signal. Hyperostosis frontalis interna. Sinuses/Orbits: Mild paranasal sinus disease predominantly ethmoid and sphenoid sinus probably due to intubation. No abnormal signal of mastoid air cells. Right intra-ocular lens replacement. Other: None. IMPRESSION: 1. Extensive motion artifact. 2. No gross structural abnormality as explanation for seizure. 3. No acute intracranial abnormality. 4. Mild progression and parenchymal volume loss and chronic microvascular ischemic changes of the brain. Electronically Signed   By: Mitzi Hansen M.D.   On: 12/28/2016 14:02   Dg Chest Port 1 View  Result Date: 12/28/2016 CLINICAL DATA:  Respiratory failure. EXAM: PORTABLE CHEST 1 VIEW COMPARISON:  12/27/2016 . FINDINGS: Endotracheal tube, left IJ line, NG tube in stable position. Cardiomegaly with normal pulmonary vascularity. No focal infiltrate. Interim clearing of bibasilar atelectasis. No pleural effusion or pneumothorax. Prior cervical spine fusion . IMPRESSION: 1. Lines and tubes in stable position. 2. Cardiomegaly, no evidence of congestive heart failure. 3. No focal pulmonary infiltrate. Interim clearing of bibasilar atelectasis. Electronically Signed   By: Maisie Fus  Register   On: 12/28/2016 08:31   Dg Chest Port 1 View  Result Date: 12/27/2016 CLINICAL DATA:  Central line placement. EXAM: PORTABLE CHEST 1 VIEW COMPARISON:  CT scan and radiograph of July 28, 2015. FINDINGS: Stable cardiomediastinal silhouette. Endotracheal tube is seen projected over tracheal air shadow with distal tip 2.5 cm above the carina. Nasogastric tube is seen entering stomach. Left internal jugular catheter is noted  with distal tip in expected position of the SVC. No pneumothorax or pleural effusion is noted. Mild bibasilar subsegmental atelectasis is noted. Bony thorax is unremarkable. IMPRESSION: Endotracheal and nasogastric tubes in grossly good position. Left internal jugular catheter is noted with distal tip in expected position of the SVC. No pneumothorax is noted. Mild bibasilar subsegmental atelectasis. Electronically Signed   By: Lupita Raider, M.D.   On: 12/27/2016 16:05   Dg Abd Portable 1v  Result Date: 12/27/2016 CLINICAL DATA:  71 year old Virginia [name] status post central line placement. Orogastric tube placement. EXAM: PORTABLE ABDOMEN - 1 VIEW COMPARISON:  Chest x-ray 07/28/2015. FINDINGS: An endotracheal tube is in place with tip 2.0 cm above the carina. Left internal jugular central venous catheter with tip terminating in the superior cavoatrial junction. Enteric tube extending into the antral pre-pyloric region of the stomach or proximal duodenum. Lung volumes are low. Ill-defined bibasilar opacities may reflect areas of atelectasis and/or airspace consolidation. No pleural effusions. No evidence of pulmonary edema. Heart size is borderline enlarged. The patient is rotated to the left on today's exam, resulting in distortion of the mediastinal contours and reduced diagnostic sensitivity and specificity for mediastinal pathology. IMPRESSION: 1. Support apparatus, as above. 2. Low lung volumes with increasing bibasilar opacities which may reflect areas of atelectasis and/or airspace consolidation. Electronically Signed   By: Trudie Reed M.D.   On: 12/27/2016 16:03     Labs:   Basic Metabolic Panel:  Recent Labs Lab 01/01/17 0205  01/02/17 0446 01/03/17 0500  01/04/17 0432 01/04/17 1735 01/05/17 0435  NA 141  < > 142 141 145 142 138  K 3.1*  < > 3.9 4.1 2.8* 2.9* 3.3*  CL 99*  < > 105 103 101 100* 97*  CO2 32  < > 30 32 32 31 29  GLUCOSE 227*  < > 246* 279* 179* 111* 134*  BUN 24*  < >  21* 23* 23* 20 14  CREATININE 0.67  < > 0.65 0.68 0.78 0.73 0.69  CALCIUM 9.0  < > 8.5* 8.9 8.7* 8.5* 8.3*  MG 2.1  --  2.2 2.1 2.0  --  2.1  PHOS 3.8  --  3.3 3.9 4.2  --  3.9  < > = values in this interval not displayed. GFR Estimated Creatinine Clearance: 70.6 mL/min (by C-G formula based on SCr of 0.69 mg/dL). Liver Function Tests: No results for input(s): AST, ALT, ALKPHOS, BILITOT, PROT, ALBUMIN in the last 168 hours. No results for input(s): LIPASE, AMYLASE in the last 168 hours.  Recent Labs Lab 01/01/17 1300  AMMONIA 35   Coagulation profile No results for input(s): INR, PROTIME in the last 168 hours.  CBC:  Recent Labs Lab 12/30/16 0418 12/31/16 0401 01/01/17 0205 01/02/17 0446 01/03/17 0500  WBC 8.6 7.5 8.8 8.8 9.2  HGB 10.0* 9.9* 11.2* 10.5* 11.1*  HCT 31.8* 31.5* 35.9* 33.8* 35.0*  MCV 90.1 91.3 90.2 90.6 90.4  PLT 158 175 221 206 224   Cardiac Enzymes: No results for input(s): CKTOTAL, CKMB, CKMBINDEX, TROPONINI in the last 168 hours. BNP: Invalid input(s): POCBNP CBG:  Recent Labs Lab 01/04/17 1959 01/05/17 0016 01/05/17 0522 01/05/17 0738 01/05/17 1152  GLUCAP 133* 107* 132* 136* 198*   D-Dimer No results for input(s): DDIMER in the last 72 hours. Hgb A1c No results for input(s): HGBA1C in the last 72 hours. Lipid Profile No results for input(s): CHOL, HDL, LDLCALC, TRIG, CHOLHDL, LDLDIRECT in the last 72 hours. Thyroid function studies No results for input(s): TSH, T4TOTAL, T3FREE, THYROIDAB in the last 72 hours.  Invalid input(s): FREET3 Anemia work up No results for input(s): VITAMINB12, FOLATE, FERRITIN, TIBC, IRON, RETICCTPCT in the last 72 hours. Microbiology Recent Results (from the past 240 hour(s))  MRSA PCR Screening     Status: Abnormal   Collection Time: 12/27/16 12:39 PM  Result Value Ref Range Status   MRSA by PCR POSITIVE (A) NEGATIVE Final    Comment:        The GeneXpert MRSA Assay (FDA approved for NASAL  specimens only), is one component of a comprehensive MRSA colonization surveillance program. It is not intended to diagnose MRSA infection nor to guide or monitor treatment for MRSA infections. RESULT CALLED TO, READ BACK BY AND VERIFIED WITH: Festus HoltsS. Washburn RN 14:30 12/27/16 ([email or web address])   Culture, Urine     Status: Abnormal   Collection Time: 12/28/16  1:50 AM  Result Value Ref Range Status   Specimen Description URINE, CATHETERIZED  Final   Special Requests NONE  Final   Culture <10,000 COLONIES/mL INSIGNIFICANT GROWTH (A)  Final   Report Status 12/29/2016 FINAL  Final  Culture, respiratory (NON-Expectorated)     Status: None   Collection Time: 12/31/16  4:01 PM  Result Value Ref Range Status   Specimen Description TRACHEAL ASPIRATE  Final   Special Requests NONE  Final   Gram Stain   Final    ABUNDANT WBC PRESENT, PREDOMINANTLY PMN NO ORGANISMS SEEN    Culture Consistent with normal respiratory flora.  Final   Report Status 01/02/2017 FINAL  Final  Culture, respiratory (NON-Expectorated)     Status: None   Collection Time: 01/02/17 12:20 PM  Result Value Ref Range Status   Specimen Description TRACHEAL ASPIRATE  Final   Special Requests Normal  Final   Gram Stain   Final    ABUNDANT WBC PRESENT,BOTH PMN AND MONONUCLEAR NO SQUAMOUS EPITHELIAL CELLS SEEN NO ORGANISMS SEEN    Culture Consistent with normal respiratory flora.  Final   Report Status 01/04/2017 FINAL  Final     Discharge Instructions:   Discharge Instructions    Call MD for:    Complete by:  As directed    Recurrent seizures.   Call MD for:  extreme fatigue    Complete by:  As directed    Diet - low sodium heart healthy    Complete by:  As directed    Diet Carb Modified    Complete by:  As directed    Increase activity slowly    Complete by:  As directed      Allergies as of 01/05/2017      Reactions   Shellfish Allergy Anaphylaxis, Swelling, Rash   Iodine Hives   Latex Itching   Morphine And  Related Other (See Comments)   Altered mental status (fighting, agitation)      Medication List    STOP taking these medications   fentaNYL 75 MCG/HR Commonly known as:  DURAGESIC - dosed mcg/hr Replaced by:  fentaNYL 25 MCG/HR patch     TAKE these medications   acetaminophen 500 MG tablet Commonly known as:  TYLENOL Take 1,000 mg by mouth every 6 (six) hours as needed for moderate pain or headache.   albuterol 108 (90 Base) MCG/ACT inhaler Commonly known as:  PROVENTIL HFA;VENTOLIN HFA Inhale 2 puffs into the lungs every 4 (four) hours as needed for wheezing or shortness of breath.   aspirin 325 MG tablet Take 325 mg by mouth daily.   baclofen 10 MG tablet Commonly known as:  LIORESAL Take 5-10 mg by mouth 3 (three) times daily.   diphenhydrAMINE 25 MG tablet Commonly known as:  BENADRYL Take 50 mg by mouth at bedtime as needed for sleep.   DULoxetine 60 MG capsule Commonly known as:  CYMBALTA Take 60 mg by mouth daily.   fentaNYL 25 MCG/HR patch Commonly known as:  DURAGESIC - dosed mcg/hr Place 1 patch (25 mcg total) onto the skin every 3 (three) days. Start taking on:  01/07/2017 Replaces:  fentaNYL 75 MCG/HR   furosemide 40 MG tablet Commonly known as:  LASIX Take 40 mg by mouth.   gabapentin 300 MG capsule Commonly known as:  NEURONTIN Take 300-900 mg by mouth 3 (three) times daily. Takes 1 capsule of 300mg  in morning and afternoon and 3 capsules (900mg ) at bedtime.   glipiZIDE 10 MG tablet Commonly known as:  GLUCOTROL Take 10 mg by mouth 2 (two) times daily.   hydrochlorothiazide 12.5 MG tablet Commonly known as:  HYDRODIURIL Take 12.5 mg by mouth daily as needed (for fluid).   hydrOXYzine 25 MG tablet Commonly known as:  ATARAX/VISTARIL Take 1 tablet (25 mg total) by mouth every 8 (eight) hours as needed for anxiety.   insulin NPH Human 100 UNIT/ML injection Commonly known as:  HUMULIN N,NOVOLIN N Inject 60 Units into the skin 2 (two) times  daily. *May take a third dose of 60 units for high blood sugars*   levETIRAcetam 500 MG tablet Commonly known  as:  KEPPRA Take 1 tablet (500 mg total) by mouth 2 (two) times daily.   lisinopril 40 MG tablet Commonly known as:  PRINIVIL,ZESTRIL Take 40 mg by mouth daily.   LORazepam 2 MG tablet Commonly known as:  ATIVAN Take 2 mg by mouth 3 (three) times daily as needed for anxiety.   metoprolol tartrate 25 MG tablet Commonly known as:  LOPRESSOR Take 25 mg by mouth 2 (two) times daily.   NOVOLOG 100 UNIT/ML injection Generic drug:  insulin aspart Inject 1-10 Units into the skin 3 (three) times daily as needed. For blood sugar   oxyCODONE 5 MG immediate release tablet Commonly known as:  Oxy IR/ROXICODONE Take 10 mg by mouth every 6 (six) hours as needed for severe pain.      Follow-up Information    Selinda Flavin, MD. Schedule an appointment as soon as possible for a visit in 2 week(s).   Specialty:  Family Medicine Contact information: [address] [place] [address] [phone]        Trey Sailors, MD Follow up.   Specialty:  Neurosurgery Why:  At your scheduled appointment. Contact information: [address] [place] [address] [phone]            Time coordinating discharge: 35 minutes  Signed:  RAMA,CHRISTINA  Pager [phone] [facility] 01/05/2017, 5:37 PM

## 2017-01-20 DIAGNOSIS — E1165 Type 2 diabetes mellitus with hyperglycemia: Secondary | ICD-10-CM | POA: Diagnosis not present

## 2017-01-20 DIAGNOSIS — M797 Fibromyalgia: Secondary | ICD-10-CM | POA: Diagnosis not present

## 2017-01-20 DIAGNOSIS — I10 Essential (primary) hypertension: Secondary | ICD-10-CM | POA: Diagnosis not present

## 2017-01-20 DIAGNOSIS — I638 Other cerebral infarction: Secondary | ICD-10-CM | POA: Diagnosis not present

## 2017-01-20 DIAGNOSIS — F411 Generalized anxiety disorder: Secondary | ICD-10-CM | POA: Diagnosis not present

## 2017-01-20 DIAGNOSIS — E1140 Type 2 diabetes mellitus with diabetic neuropathy, unspecified: Secondary | ICD-10-CM | POA: Diagnosis not present

## 2017-01-20 DIAGNOSIS — G40909 Epilepsy, unspecified, not intractable, without status epilepticus: Secondary | ICD-10-CM | POA: Diagnosis not present

## 2017-01-20 DIAGNOSIS — G6289 Other specified polyneuropathies: Secondary | ICD-10-CM | POA: Diagnosis not present

## 2017-01-22 IMAGING — CT CT ANGIO CHEST
1 of 6 series · 5 of 36 positions shown · IV contrast (Omnipaque 300)
Comparison: Chest radiograph performed earlier today at [DATE] p.m.

CLINICAL DATA: Acute onset of shortness of breath and clamminess.
Initial encounter.

EXAM:
CT ANGIOGRAPHY CHEST WITH CONTRAST
TECHNIQUE: Multidetector CT imaging of the chest was performed using the
standard protocol during bolus administration of intravenous
contrast. Multiplanar CT image reconstructions and MIPs were
obtained to evaluate the vascular anatomy.
CONTRAST:  100mL OMNIPAQUE IOHEXOL 350 MG/ML SOLN

[Series 4: pe 3.0 b40f · axial · 0.67mm/px · z∈[-251,-77]mm · 5 of 88 slices shown]
[im 15/88  lung]
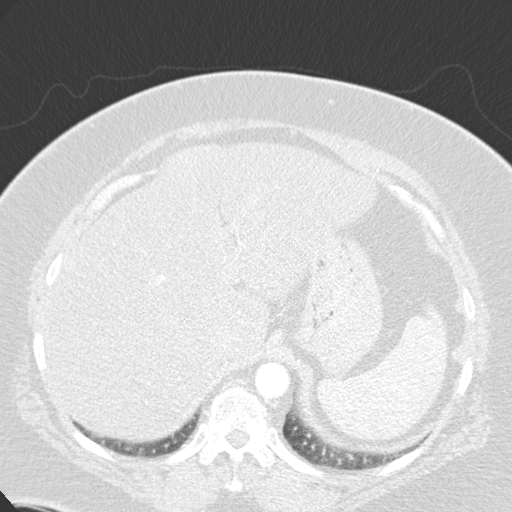
[im 30/88  mediastinal]
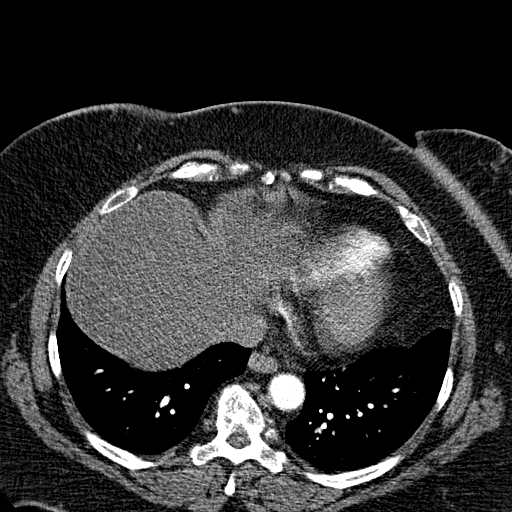
[im 44/88  lung]
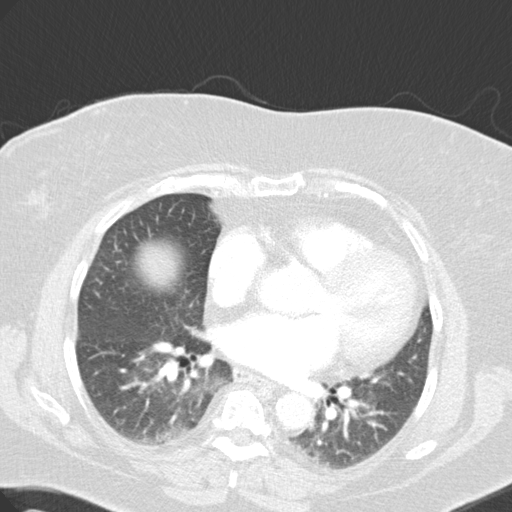
[im 59/88  mediastinal]
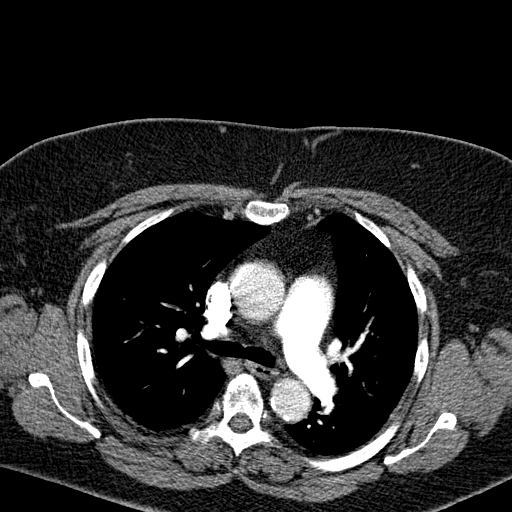
[im 73/88  lung]
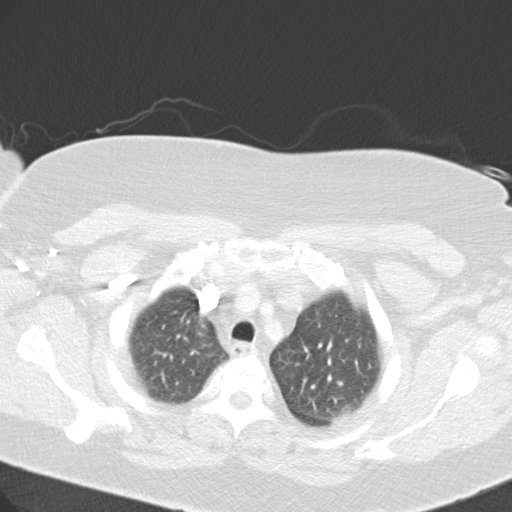

[5 of 36 positions shown; findings below may reference images not displayed]

FINDINGS: There is no evidence of pulmonary embolus.

Minimal hazy bilateral central opacities may reflect atelectasis or
possibly mild pneumonia. There is no evidence of pleural effusion or
pneumothorax. No masses are identified; no abnormal focal contrast
enhancement is seen.

Scattered coronary artery calcifications are seen. The mediastinum
is otherwise unremarkable. No mediastinal lymphadenopathy is seen.
No pericardial effusions identified. Scattered calcification is
noted along the descending thoracic aorta. The great vessels are
grossly unremarkable in appearance. No axillary lymphadenopathy is
seen. A calcification is noted at the left thyroid lobe. No dominant
thyroid mass is seen.

The visualized portions of the liver and spleen are unremarkable.

No acute osseous abnormalities are seen.

Review of the MIP images confirms the above findings.
IMPRESSION: 1. No evidence of pulmonary embolus.
2. Minimal hazy bilateral central airspace opacities may reflect
atelectasis or possibly mild pneumonia.
3. Scattered coronary artery calcifications seen.

## 2017-02-01 DIAGNOSIS — H35432 Paving stone degeneration of retina, left eye: Secondary | ICD-10-CM | POA: Diagnosis not present

## 2017-02-01 DIAGNOSIS — H43813 Vitreous degeneration, bilateral: Secondary | ICD-10-CM | POA: Diagnosis not present

## 2017-02-01 DIAGNOSIS — H34812 Central retinal vein occlusion, left eye, with macular edema: Secondary | ICD-10-CM | POA: Diagnosis not present

## 2017-02-01 DIAGNOSIS — H348112 Central retinal vein occlusion, right eye, stable: Secondary | ICD-10-CM | POA: Diagnosis not present

## 2017-02-10 DIAGNOSIS — Z6841 Body Mass Index (BMI) 40.0 and over, adult: Secondary | ICD-10-CM | POA: Diagnosis not present

## 2017-02-10 DIAGNOSIS — I10 Essential (primary) hypertension: Secondary | ICD-10-CM | POA: Diagnosis not present

## 2017-02-10 DIAGNOSIS — E1140 Type 2 diabetes mellitus with diabetic neuropathy, unspecified: Secondary | ICD-10-CM | POA: Diagnosis not present

## 2017-02-10 DIAGNOSIS — E1165 Type 2 diabetes mellitus with hyperglycemia: Secondary | ICD-10-CM | POA: Diagnosis not present

## 2017-02-10 DIAGNOSIS — F411 Generalized anxiety disorder: Secondary | ICD-10-CM | POA: Diagnosis not present

## 2017-02-10 DIAGNOSIS — G40909 Epilepsy, unspecified, not intractable, without status epilepticus: Secondary | ICD-10-CM | POA: Diagnosis not present

## 2017-03-14 DIAGNOSIS — E1042 Type 1 diabetes mellitus with diabetic polyneuropathy: Secondary | ICD-10-CM | POA: Diagnosis not present

## 2017-03-14 DIAGNOSIS — F4542 Pain disorder with related psychological factors: Secondary | ICD-10-CM | POA: Diagnosis not present

## 2017-03-14 DIAGNOSIS — M961 Postlaminectomy syndrome, not elsewhere classified: Secondary | ICD-10-CM | POA: Diagnosis not present

## 2017-03-14 DIAGNOSIS — F341 Dysthymic disorder: Secondary | ICD-10-CM | POA: Diagnosis not present

## 2017-03-14 DIAGNOSIS — F411 Generalized anxiety disorder: Secondary | ICD-10-CM | POA: Diagnosis not present

## 2017-03-15 DIAGNOSIS — Z9049 Acquired absence of other specified parts of digestive tract: Secondary | ICD-10-CM | POA: Diagnosis not present

## 2017-03-15 DIAGNOSIS — E1165 Type 2 diabetes mellitus with hyperglycemia: Secondary | ICD-10-CM | POA: Diagnosis not present

## 2017-03-15 DIAGNOSIS — E669 Obesity, unspecified: Secondary | ICD-10-CM | POA: Diagnosis not present

## 2017-03-15 DIAGNOSIS — Z6841 Body Mass Index (BMI) 40.0 and over, adult: Secondary | ICD-10-CM | POA: Diagnosis not present

## 2017-03-15 DIAGNOSIS — R0602 Shortness of breath: Secondary | ICD-10-CM | POA: Diagnosis not present

## 2017-03-15 DIAGNOSIS — E119 Type 2 diabetes mellitus without complications: Secondary | ICD-10-CM | POA: Diagnosis not present

## 2017-03-15 DIAGNOSIS — G629 Polyneuropathy, unspecified: Secondary | ICD-10-CM | POA: Diagnosis not present

## 2017-03-15 DIAGNOSIS — R079 Chest pain, unspecified: Secondary | ICD-10-CM | POA: Diagnosis not present

## 2017-03-15 DIAGNOSIS — R0789 Other chest pain: Secondary | ICD-10-CM | POA: Diagnosis not present

## 2017-03-15 DIAGNOSIS — Z886 Allergy status to analgesic agent status: Secondary | ICD-10-CM | POA: Diagnosis not present

## 2017-03-15 DIAGNOSIS — M797 Fibromyalgia: Secondary | ICD-10-CM | POA: Diagnosis not present

## 2017-03-15 DIAGNOSIS — Z79899 Other long term (current) drug therapy: Secondary | ICD-10-CM | POA: Diagnosis not present

## 2017-03-15 DIAGNOSIS — Z79891 Long term (current) use of opiate analgesic: Secondary | ICD-10-CM | POA: Diagnosis not present

## 2017-03-15 DIAGNOSIS — I10 Essential (primary) hypertension: Secondary | ICD-10-CM | POA: Diagnosis not present

## 2017-03-15 DIAGNOSIS — Z780 Asymptomatic menopausal state: Secondary | ICD-10-CM | POA: Diagnosis not present

## 2017-03-15 DIAGNOSIS — R350 Frequency of micturition: Secondary | ICD-10-CM | POA: Diagnosis not present

## 2017-03-15 DIAGNOSIS — Z794 Long term (current) use of insulin: Secondary | ICD-10-CM | POA: Diagnosis not present

## 2017-03-15 DIAGNOSIS — R531 Weakness: Secondary | ICD-10-CM | POA: Diagnosis not present

## 2017-03-15 DIAGNOSIS — Z90710 Acquired absence of both cervix and uterus: Secondary | ICD-10-CM | POA: Diagnosis not present

## 2017-03-15 DIAGNOSIS — Z7982 Long term (current) use of aspirin: Secondary | ICD-10-CM | POA: Diagnosis not present

## 2017-03-15 DIAGNOSIS — Z8673 Personal history of transient ischemic attack (TIA), and cerebral infarction without residual deficits: Secondary | ICD-10-CM | POA: Diagnosis not present

## 2017-03-15 DIAGNOSIS — F411 Generalized anxiety disorder: Secondary | ICD-10-CM | POA: Diagnosis not present

## 2017-03-15 DIAGNOSIS — Z888 Allergy status to other drugs, medicaments and biological substances status: Secondary | ICD-10-CM | POA: Diagnosis not present

## 2017-03-15 DIAGNOSIS — E1140 Type 2 diabetes mellitus with diabetic neuropathy, unspecified: Secondary | ICD-10-CM | POA: Diagnosis not present

## 2017-03-16 DIAGNOSIS — R079 Chest pain, unspecified: Secondary | ICD-10-CM | POA: Diagnosis not present

## 2017-03-29 DIAGNOSIS — H348132 Central retinal vein occlusion, bilateral, stable: Secondary | ICD-10-CM | POA: Diagnosis not present

## 2017-03-29 DIAGNOSIS — H43813 Vitreous degeneration, bilateral: Secondary | ICD-10-CM | POA: Diagnosis not present

## 2017-03-31 DIAGNOSIS — M47816 Spondylosis without myelopathy or radiculopathy, lumbar region: Secondary | ICD-10-CM | POA: Diagnosis not present

## 2017-03-31 DIAGNOSIS — M4316 Spondylolisthesis, lumbar region: Secondary | ICD-10-CM | POA: Diagnosis not present

## 2017-03-31 DIAGNOSIS — E1140 Type 2 diabetes mellitus with diabetic neuropathy, unspecified: Secondary | ICD-10-CM | POA: Diagnosis not present

## 2017-04-28 DIAGNOSIS — M1990 Unspecified osteoarthritis, unspecified site: Secondary | ICD-10-CM | POA: Diagnosis not present

## 2017-04-28 DIAGNOSIS — Z794 Long term (current) use of insulin: Secondary | ICD-10-CM | POA: Diagnosis not present

## 2017-04-28 DIAGNOSIS — E669 Obesity, unspecified: Secondary | ICD-10-CM | POA: Diagnosis not present

## 2017-04-28 DIAGNOSIS — Z7982 Long term (current) use of aspirin: Secondary | ICD-10-CM | POA: Diagnosis not present

## 2017-04-28 DIAGNOSIS — F419 Anxiety disorder, unspecified: Secondary | ICD-10-CM | POA: Diagnosis not present

## 2017-04-28 DIAGNOSIS — Z6841 Body Mass Index (BMI) 40.0 and over, adult: Secondary | ICD-10-CM | POA: Diagnosis not present

## 2017-04-28 DIAGNOSIS — Z886 Allergy status to analgesic agent status: Secondary | ICD-10-CM | POA: Diagnosis not present

## 2017-04-28 DIAGNOSIS — M797 Fibromyalgia: Secondary | ICD-10-CM | POA: Diagnosis not present

## 2017-04-28 DIAGNOSIS — Z981 Arthrodesis status: Secondary | ICD-10-CM | POA: Diagnosis not present

## 2017-04-28 DIAGNOSIS — Z8673 Personal history of transient ischemic attack (TIA), and cerebral infarction without residual deficits: Secondary | ICD-10-CM | POA: Diagnosis not present

## 2017-04-28 DIAGNOSIS — Z9049 Acquired absence of other specified parts of digestive tract: Secondary | ICD-10-CM | POA: Diagnosis not present

## 2017-04-28 DIAGNOSIS — Z90710 Acquired absence of both cervix and uterus: Secondary | ICD-10-CM | POA: Diagnosis not present

## 2017-04-28 DIAGNOSIS — M47816 Spondylosis without myelopathy or radiculopathy, lumbar region: Secondary | ICD-10-CM | POA: Diagnosis not present

## 2017-04-28 DIAGNOSIS — Z79899 Other long term (current) drug therapy: Secondary | ICD-10-CM | POA: Diagnosis not present

## 2017-04-28 DIAGNOSIS — Z888 Allergy status to other drugs, medicaments and biological substances status: Secondary | ICD-10-CM | POA: Diagnosis not present

## 2017-04-28 DIAGNOSIS — M4716 Other spondylosis with myelopathy, lumbar region: Secondary | ICD-10-CM | POA: Diagnosis not present

## 2017-04-28 DIAGNOSIS — E119 Type 2 diabetes mellitus without complications: Secondary | ICD-10-CM | POA: Diagnosis not present

## 2017-04-28 DIAGNOSIS — Z91013 Allergy to seafood: Secondary | ICD-10-CM | POA: Diagnosis not present

## 2017-04-28 DIAGNOSIS — M47812 Spondylosis without myelopathy or radiculopathy, cervical region: Secondary | ICD-10-CM | POA: Diagnosis not present

## 2017-04-28 DIAGNOSIS — Z9889 Other specified postprocedural states: Secondary | ICD-10-CM | POA: Diagnosis not present

## 2017-04-28 DIAGNOSIS — I10 Essential (primary) hypertension: Secondary | ICD-10-CM | POA: Diagnosis not present

## 2017-04-28 DIAGNOSIS — F329 Major depressive disorder, single episode, unspecified: Secondary | ICD-10-CM | POA: Diagnosis not present

## 2017-04-28 DIAGNOSIS — H269 Unspecified cataract: Secondary | ICD-10-CM | POA: Diagnosis not present

## 2017-04-28 DIAGNOSIS — M4316 Spondylolisthesis, lumbar region: Secondary | ICD-10-CM | POA: Diagnosis not present

## 2017-04-28 DIAGNOSIS — Z978 Presence of other specified devices: Secondary | ICD-10-CM | POA: Diagnosis not present

## 2017-04-28 DIAGNOSIS — G894 Chronic pain syndrome: Secondary | ICD-10-CM | POA: Diagnosis not present

## 2017-04-29 DIAGNOSIS — M47816 Spondylosis without myelopathy or radiculopathy, lumbar region: Secondary | ICD-10-CM | POA: Diagnosis not present

## 2017-04-29 DIAGNOSIS — M4316 Spondylolisthesis, lumbar region: Secondary | ICD-10-CM | POA: Diagnosis not present

## 2017-05-04 DIAGNOSIS — E119 Type 2 diabetes mellitus without complications: Secondary | ICD-10-CM | POA: Diagnosis not present

## 2017-05-04 DIAGNOSIS — M4316 Spondylolisthesis, lumbar region: Secondary | ICD-10-CM | POA: Diagnosis not present

## 2017-05-04 DIAGNOSIS — I10 Essential (primary) hypertension: Secondary | ICD-10-CM | POA: Diagnosis not present

## 2017-05-04 DIAGNOSIS — M47816 Spondylosis without myelopathy or radiculopathy, lumbar region: Secondary | ICD-10-CM | POA: Diagnosis not present

## 2017-05-04 DIAGNOSIS — G894 Chronic pain syndrome: Secondary | ICD-10-CM | POA: Diagnosis not present

## 2017-05-12 DIAGNOSIS — T8579XA Infection and inflammatory reaction due to other internal prosthetic devices, implants and grafts, initial encounter: Secondary | ICD-10-CM | POA: Diagnosis not present

## 2017-05-12 DIAGNOSIS — Y753 Surgical instruments, materials and neurological devices (including sutures) associated with adverse incidents: Secondary | ICD-10-CM | POA: Diagnosis not present

## 2017-05-12 DIAGNOSIS — Y838 Other surgical procedures as the cause of abnormal reaction of the patient, or of later complication, without mention of misadventure at the time of the procedure: Secondary | ICD-10-CM | POA: Diagnosis not present

## 2017-05-12 DIAGNOSIS — Z7982 Long term (current) use of aspirin: Secondary | ICD-10-CM | POA: Diagnosis not present

## 2017-05-12 DIAGNOSIS — E119 Type 2 diabetes mellitus without complications: Secondary | ICD-10-CM | POA: Diagnosis not present

## 2017-05-12 DIAGNOSIS — T814XXA Infection following a procedure, initial encounter: Secondary | ICD-10-CM | POA: Diagnosis not present

## 2017-05-12 DIAGNOSIS — M797 Fibromyalgia: Secondary | ICD-10-CM | POA: Diagnosis not present

## 2017-05-12 DIAGNOSIS — T85734A Infection and inflammatory reaction due to implanted electronic neurostimulator, generator, initial encounter: Secondary | ICD-10-CM | POA: Diagnosis not present

## 2017-05-12 DIAGNOSIS — Z8673 Personal history of transient ischemic attack (TIA), and cerebral infarction without residual deficits: Secondary | ICD-10-CM | POA: Diagnosis not present

## 2017-05-12 DIAGNOSIS — Z79891 Long term (current) use of opiate analgesic: Secondary | ICD-10-CM | POA: Diagnosis not present

## 2017-05-12 DIAGNOSIS — B9562 Methicillin resistant Staphylococcus aureus infection as the cause of diseases classified elsewhere: Secondary | ICD-10-CM | POA: Diagnosis not present

## 2017-05-12 DIAGNOSIS — I10 Essential (primary) hypertension: Secondary | ICD-10-CM | POA: Diagnosis not present

## 2017-05-12 DIAGNOSIS — E669 Obesity, unspecified: Secondary | ICD-10-CM | POA: Diagnosis not present

## 2017-05-12 DIAGNOSIS — Z79899 Other long term (current) drug therapy: Secondary | ICD-10-CM | POA: Diagnosis not present

## 2017-05-12 DIAGNOSIS — T85192A Other mechanical complication of implanted electronic neurostimulator (electrode) of spinal cord, initial encounter: Secondary | ICD-10-CM | POA: Diagnosis not present

## 2017-05-12 DIAGNOSIS — G8929 Other chronic pain: Secondary | ICD-10-CM | POA: Diagnosis not present

## 2017-05-12 DIAGNOSIS — E1165 Type 2 diabetes mellitus with hyperglycemia: Secondary | ICD-10-CM | POA: Diagnosis not present

## 2017-05-12 DIAGNOSIS — Z6841 Body Mass Index (BMI) 40.0 and over, adult: Secondary | ICD-10-CM | POA: Diagnosis not present

## 2017-05-12 DIAGNOSIS — F329 Major depressive disorder, single episode, unspecified: Secondary | ICD-10-CM | POA: Diagnosis not present

## 2017-05-12 DIAGNOSIS — Z794 Long term (current) use of insulin: Secondary | ICD-10-CM | POA: Diagnosis not present

## 2017-05-16 DIAGNOSIS — Z452 Encounter for adjustment and management of vascular access device: Secondary | ICD-10-CM | POA: Diagnosis not present

## 2017-05-17 DIAGNOSIS — I10 Essential (primary) hypertension: Secondary | ICD-10-CM | POA: Diagnosis not present

## 2017-05-17 DIAGNOSIS — E119 Type 2 diabetes mellitus without complications: Secondary | ICD-10-CM | POA: Diagnosis not present

## 2017-05-18 DIAGNOSIS — Z452 Encounter for adjustment and management of vascular access device: Secondary | ICD-10-CM | POA: Diagnosis not present

## 2017-05-18 DIAGNOSIS — T8579XA Infection and inflammatory reaction due to other internal prosthetic devices, implants and grafts, initial encounter: Secondary | ICD-10-CM | POA: Diagnosis not present

## 2017-05-18 DIAGNOSIS — E119 Type 2 diabetes mellitus without complications: Secondary | ICD-10-CM | POA: Diagnosis not present

## 2017-05-18 DIAGNOSIS — M5430 Sciatica, unspecified side: Secondary | ICD-10-CM | POA: Diagnosis not present

## 2017-05-18 DIAGNOSIS — I10 Essential (primary) hypertension: Secondary | ICD-10-CM | POA: Diagnosis not present

## 2017-05-18 DIAGNOSIS — M797 Fibromyalgia: Secondary | ICD-10-CM | POA: Diagnosis not present

## 2017-05-18 DIAGNOSIS — R110 Nausea: Secondary | ICD-10-CM | POA: Diagnosis not present

## 2017-05-18 DIAGNOSIS — Z6841 Body Mass Index (BMI) 40.0 and over, adult: Secondary | ICD-10-CM | POA: Diagnosis not present

## 2017-05-18 DIAGNOSIS — E1140 Type 2 diabetes mellitus with diabetic neuropathy, unspecified: Secondary | ICD-10-CM | POA: Diagnosis not present

## 2017-05-18 DIAGNOSIS — I638 Other cerebral infarction: Secondary | ICD-10-CM | POA: Diagnosis not present

## 2017-05-18 DIAGNOSIS — G40909 Epilepsy, unspecified, not intractable, without status epilepticus: Secondary | ICD-10-CM | POA: Diagnosis not present

## 2017-05-19 DIAGNOSIS — Z452 Encounter for adjustment and management of vascular access device: Secondary | ICD-10-CM | POA: Diagnosis not present

## 2017-05-19 DIAGNOSIS — E119 Type 2 diabetes mellitus without complications: Secondary | ICD-10-CM | POA: Diagnosis not present

## 2017-05-19 DIAGNOSIS — T8579XA Infection and inflammatory reaction due to other internal prosthetic devices, implants and grafts, initial encounter: Secondary | ICD-10-CM | POA: Diagnosis not present

## 2017-05-23 DIAGNOSIS — E119 Type 2 diabetes mellitus without complications: Secondary | ICD-10-CM | POA: Diagnosis not present

## 2017-05-23 DIAGNOSIS — Z452 Encounter for adjustment and management of vascular access device: Secondary | ICD-10-CM | POA: Diagnosis not present

## 2017-05-23 DIAGNOSIS — T8579XA Infection and inflammatory reaction due to other internal prosthetic devices, implants and grafts, initial encounter: Secondary | ICD-10-CM | POA: Diagnosis not present

## 2017-05-26 DIAGNOSIS — T8579XA Infection and inflammatory reaction due to other internal prosthetic devices, implants and grafts, initial encounter: Secondary | ICD-10-CM | POA: Diagnosis not present

## 2017-05-26 DIAGNOSIS — Z452 Encounter for adjustment and management of vascular access device: Secondary | ICD-10-CM | POA: Diagnosis not present

## 2017-05-26 DIAGNOSIS — E119 Type 2 diabetes mellitus without complications: Secondary | ICD-10-CM | POA: Diagnosis not present

## 2017-05-26 DIAGNOSIS — Z5181 Encounter for therapeutic drug level monitoring: Secondary | ICD-10-CM | POA: Diagnosis not present

## 2017-05-30 DIAGNOSIS — T8579XA Infection and inflammatory reaction due to other internal prosthetic devices, implants and grafts, initial encounter: Secondary | ICD-10-CM | POA: Diagnosis not present

## 2017-05-30 DIAGNOSIS — E119 Type 2 diabetes mellitus without complications: Secondary | ICD-10-CM | POA: Diagnosis not present

## 2017-05-30 DIAGNOSIS — Z452 Encounter for adjustment and management of vascular access device: Secondary | ICD-10-CM | POA: Diagnosis not present

## 2017-06-02 DIAGNOSIS — T8579XA Infection and inflammatory reaction due to other internal prosthetic devices, implants and grafts, initial encounter: Secondary | ICD-10-CM | POA: Diagnosis not present

## 2017-06-02 DIAGNOSIS — E119 Type 2 diabetes mellitus without complications: Secondary | ICD-10-CM | POA: Diagnosis not present

## 2017-06-02 DIAGNOSIS — Z452 Encounter for adjustment and management of vascular access device: Secondary | ICD-10-CM | POA: Diagnosis not present

## 2017-06-03 DIAGNOSIS — Z452 Encounter for adjustment and management of vascular access device: Secondary | ICD-10-CM | POA: Diagnosis not present

## 2017-06-06 DIAGNOSIS — Z452 Encounter for adjustment and management of vascular access device: Secondary | ICD-10-CM | POA: Diagnosis not present

## 2017-06-06 DIAGNOSIS — T8579XA Infection and inflammatory reaction due to other internal prosthetic devices, implants and grafts, initial encounter: Secondary | ICD-10-CM | POA: Diagnosis not present

## 2017-06-06 DIAGNOSIS — E119 Type 2 diabetes mellitus without complications: Secondary | ICD-10-CM | POA: Diagnosis not present

## 2017-06-09 DIAGNOSIS — T8579XA Infection and inflammatory reaction due to other internal prosthetic devices, implants and grafts, initial encounter: Secondary | ICD-10-CM | POA: Diagnosis not present

## 2017-06-09 DIAGNOSIS — Z452 Encounter for adjustment and management of vascular access device: Secondary | ICD-10-CM | POA: Diagnosis not present

## 2017-06-09 DIAGNOSIS — E119 Type 2 diabetes mellitus without complications: Secondary | ICD-10-CM | POA: Diagnosis not present

## 2017-06-13 DIAGNOSIS — Z452 Encounter for adjustment and management of vascular access device: Secondary | ICD-10-CM | POA: Diagnosis not present

## 2017-06-13 DIAGNOSIS — E119 Type 2 diabetes mellitus without complications: Secondary | ICD-10-CM | POA: Diagnosis not present

## 2017-06-13 DIAGNOSIS — T8579XA Infection and inflammatory reaction due to other internal prosthetic devices, implants and grafts, initial encounter: Secondary | ICD-10-CM | POA: Diagnosis not present

## 2017-06-16 ENCOUNTER — Other Ambulatory Visit (HOSPITAL_COMMUNITY)
Admission: RE | Admit: 2017-06-16 | Discharge: 2017-06-16 | Disposition: A | Discharge status: Home / Self Care | Payer: PPO | Source: Ambulatory Visit | Attending: Neurosurgery | Admitting: Neurosurgery

## 2017-06-16 DIAGNOSIS — Y839 Surgical procedure, unspecified as the cause of abnormal reaction of the patient, or of later complication, without mention of misadventure at the time of the procedure: Secondary | ICD-10-CM | POA: Diagnosis not present

## 2017-06-16 DIAGNOSIS — T814XXA Infection following a procedure, initial encounter: Secondary | ICD-10-CM | POA: Insufficient documentation

## 2017-06-16 DIAGNOSIS — M47816 Spondylosis without myelopathy or radiculopathy, lumbar region: Secondary | ICD-10-CM | POA: Insufficient documentation

## 2017-06-16 DIAGNOSIS — M4316 Spondylolisthesis, lumbar region: Secondary | ICD-10-CM | POA: Diagnosis not present

## 2017-06-16 DIAGNOSIS — Z452 Encounter for adjustment and management of vascular access device: Secondary | ICD-10-CM | POA: Diagnosis not present

## 2017-06-16 DIAGNOSIS — E1140 Type 2 diabetes mellitus with diabetic neuropathy, unspecified: Secondary | ICD-10-CM | POA: Diagnosis not present

## 2017-06-16 DIAGNOSIS — T8579XA Infection and inflammatory reaction due to other internal prosthetic devices, implants and grafts, initial encounter: Secondary | ICD-10-CM | POA: Diagnosis not present

## 2017-06-16 DIAGNOSIS — E119 Type 2 diabetes mellitus without complications: Secondary | ICD-10-CM | POA: Diagnosis not present

## 2017-06-16 LAB — SEDIMENTATION RATE: Sed Rate: 33 mm/hr — ABNORMAL HIGH (ref 0–22)

## 2017-06-17 DIAGNOSIS — E119 Type 2 diabetes mellitus without complications: Secondary | ICD-10-CM | POA: Diagnosis not present

## 2017-06-17 DIAGNOSIS — Z452 Encounter for adjustment and management of vascular access device: Secondary | ICD-10-CM | POA: Diagnosis not present

## 2017-06-17 DIAGNOSIS — T8579XA Infection and inflammatory reaction due to other internal prosthetic devices, implants and grafts, initial encounter: Secondary | ICD-10-CM | POA: Diagnosis not present

## 2017-06-28 DIAGNOSIS — H3582 Retinal ischemia: Secondary | ICD-10-CM | POA: Diagnosis not present

## 2017-06-28 DIAGNOSIS — H348112 Central retinal vein occlusion, right eye, stable: Secondary | ICD-10-CM | POA: Diagnosis not present

## 2017-06-28 DIAGNOSIS — H34812 Central retinal vein occlusion, left eye, with macular edema: Secondary | ICD-10-CM | POA: Diagnosis not present

## 2017-06-28 DIAGNOSIS — H35432 Paving stone degeneration of retina, left eye: Secondary | ICD-10-CM | POA: Diagnosis not present

## 2017-07-14 DIAGNOSIS — G40909 Epilepsy, unspecified, not intractable, without status epilepticus: Secondary | ICD-10-CM | POA: Diagnosis not present

## 2017-07-14 DIAGNOSIS — I493 Ventricular premature depolarization: Secondary | ICD-10-CM | POA: Diagnosis not present

## 2017-07-14 DIAGNOSIS — F411 Generalized anxiety disorder: Secondary | ICD-10-CM | POA: Diagnosis not present

## 2017-07-14 DIAGNOSIS — R110 Nausea: Secondary | ICD-10-CM | POA: Diagnosis not present

## 2017-07-14 DIAGNOSIS — Z6841 Body Mass Index (BMI) 40.0 and over, adult: Secondary | ICD-10-CM | POA: Diagnosis not present

## 2017-07-14 DIAGNOSIS — K219 Gastro-esophageal reflux disease without esophagitis: Secondary | ICD-10-CM | POA: Diagnosis not present

## 2017-07-14 DIAGNOSIS — E1140 Type 2 diabetes mellitus with diabetic neuropathy, unspecified: Secondary | ICD-10-CM | POA: Diagnosis not present

## 2017-07-14 DIAGNOSIS — E1165 Type 2 diabetes mellitus with hyperglycemia: Secondary | ICD-10-CM | POA: Diagnosis not present

## 2017-07-14 DIAGNOSIS — R531 Weakness: Secondary | ICD-10-CM | POA: Diagnosis not present

## 2017-09-09 DIAGNOSIS — G40909 Epilepsy, unspecified, not intractable, without status epilepticus: Secondary | ICD-10-CM | POA: Diagnosis not present

## 2017-09-09 DIAGNOSIS — I10 Essential (primary) hypertension: Secondary | ICD-10-CM | POA: Diagnosis not present

## 2017-09-09 DIAGNOSIS — K219 Gastro-esophageal reflux disease without esophagitis: Secondary | ICD-10-CM | POA: Diagnosis not present

## 2017-09-09 DIAGNOSIS — R609 Edema, unspecified: Secondary | ICD-10-CM | POA: Diagnosis not present

## 2017-09-09 DIAGNOSIS — E1140 Type 2 diabetes mellitus with diabetic neuropathy, unspecified: Secondary | ICD-10-CM | POA: Diagnosis not present

## 2017-09-09 DIAGNOSIS — F411 Generalized anxiety disorder: Secondary | ICD-10-CM | POA: Diagnosis not present

## 2017-09-09 DIAGNOSIS — G6289 Other specified polyneuropathies: Secondary | ICD-10-CM | POA: Diagnosis not present

## 2017-09-09 DIAGNOSIS — M797 Fibromyalgia: Secondary | ICD-10-CM | POA: Diagnosis not present

## 2017-09-30 DIAGNOSIS — M47816 Spondylosis without myelopathy or radiculopathy, lumbar region: Secondary | ICD-10-CM | POA: Diagnosis not present

## 2017-10-03 DIAGNOSIS — I10 Essential (primary) hypertension: Secondary | ICD-10-CM | POA: Diagnosis not present

## 2017-10-03 DIAGNOSIS — F419 Anxiety disorder, unspecified: Secondary | ICD-10-CM | POA: Diagnosis not present

## 2017-10-03 DIAGNOSIS — E119 Type 2 diabetes mellitus without complications: Secondary | ICD-10-CM | POA: Diagnosis not present

## 2017-10-03 DIAGNOSIS — Z8673 Personal history of transient ischemic attack (TIA), and cerebral infarction without residual deficits: Secondary | ICD-10-CM | POA: Diagnosis not present

## 2017-10-03 DIAGNOSIS — M797 Fibromyalgia: Secondary | ICD-10-CM | POA: Diagnosis not present

## 2017-10-03 DIAGNOSIS — M1990 Unspecified osteoarthritis, unspecified site: Secondary | ICD-10-CM | POA: Diagnosis not present

## 2017-10-03 DIAGNOSIS — F329 Major depressive disorder, single episode, unspecified: Secondary | ICD-10-CM | POA: Diagnosis not present

## 2017-10-03 DIAGNOSIS — Z7982 Long term (current) use of aspirin: Secondary | ICD-10-CM | POA: Diagnosis not present

## 2017-10-03 DIAGNOSIS — R531 Weakness: Secondary | ICD-10-CM | POA: Diagnosis not present

## 2017-10-03 DIAGNOSIS — N3000 Acute cystitis without hematuria: Secondary | ICD-10-CM | POA: Diagnosis not present

## 2017-10-03 DIAGNOSIS — Z794 Long term (current) use of insulin: Secondary | ICD-10-CM | POA: Diagnosis not present

## 2017-10-03 DIAGNOSIS — Z79899 Other long term (current) drug therapy: Secondary | ICD-10-CM | POA: Diagnosis not present

## 2017-10-04 DIAGNOSIS — M4716 Other spondylosis with myelopathy, lumbar region: Secondary | ICD-10-CM | POA: Diagnosis not present

## 2017-11-04 DIAGNOSIS — H34812 Central retinal vein occlusion, left eye, with macular edema: Secondary | ICD-10-CM | POA: Diagnosis not present

## 2017-11-04 DIAGNOSIS — H3582 Retinal ischemia: Secondary | ICD-10-CM | POA: Diagnosis not present

## 2017-11-04 DIAGNOSIS — H43813 Vitreous degeneration, bilateral: Secondary | ICD-10-CM | POA: Diagnosis not present

## 2017-11-04 DIAGNOSIS — H348112 Central retinal vein occlusion, right eye, stable: Secondary | ICD-10-CM | POA: Diagnosis not present

## 2017-12-09 DIAGNOSIS — I10 Essential (primary) hypertension: Secondary | ICD-10-CM | POA: Diagnosis not present

## 2017-12-09 DIAGNOSIS — E1165 Type 2 diabetes mellitus with hyperglycemia: Secondary | ICD-10-CM | POA: Diagnosis not present

## 2017-12-09 DIAGNOSIS — K219 Gastro-esophageal reflux disease without esophagitis: Secondary | ICD-10-CM | POA: Diagnosis not present

## 2017-12-13 DIAGNOSIS — Z0001 Encounter for general adult medical examination with abnormal findings: Secondary | ICD-10-CM | POA: Diagnosis not present

## 2017-12-13 DIAGNOSIS — E1165 Type 2 diabetes mellitus with hyperglycemia: Secondary | ICD-10-CM | POA: Diagnosis not present

## 2017-12-13 DIAGNOSIS — E1140 Type 2 diabetes mellitus with diabetic neuropathy, unspecified: Secondary | ICD-10-CM | POA: Diagnosis not present

## 2017-12-13 DIAGNOSIS — Z6841 Body Mass Index (BMI) 40.0 and over, adult: Secondary | ICD-10-CM | POA: Diagnosis not present

## 2017-12-13 DIAGNOSIS — F411 Generalized anxiety disorder: Secondary | ICD-10-CM | POA: Diagnosis not present

## 2017-12-13 DIAGNOSIS — I10 Essential (primary) hypertension: Secondary | ICD-10-CM | POA: Diagnosis not present

## 2017-12-13 DIAGNOSIS — G40909 Epilepsy, unspecified, not intractable, without status epilepticus: Secondary | ICD-10-CM | POA: Diagnosis not present

## 2018-01-03 DIAGNOSIS — M4716 Other spondylosis with myelopathy, lumbar region: Secondary | ICD-10-CM | POA: Diagnosis not present

## 2018-01-03 DIAGNOSIS — R531 Weakness: Secondary | ICD-10-CM | POA: Diagnosis not present

## 2018-01-03 DIAGNOSIS — M5416 Radiculopathy, lumbar region: Secondary | ICD-10-CM | POA: Diagnosis not present

## 2018-03-23 DIAGNOSIS — E1140 Type 2 diabetes mellitus with diabetic neuropathy, unspecified: Secondary | ICD-10-CM | POA: Diagnosis not present

## 2018-03-23 DIAGNOSIS — K219 Gastro-esophageal reflux disease without esophagitis: Secondary | ICD-10-CM | POA: Diagnosis not present

## 2018-03-23 DIAGNOSIS — E1165 Type 2 diabetes mellitus with hyperglycemia: Secondary | ICD-10-CM | POA: Diagnosis not present

## 2018-03-23 DIAGNOSIS — M797 Fibromyalgia: Secondary | ICD-10-CM | POA: Diagnosis not present

## 2018-03-23 DIAGNOSIS — I10 Essential (primary) hypertension: Secondary | ICD-10-CM | POA: Diagnosis not present

## 2018-03-28 DIAGNOSIS — I10 Essential (primary) hypertension: Secondary | ICD-10-CM | POA: Diagnosis not present

## 2018-03-28 DIAGNOSIS — F339 Major depressive disorder, recurrent, unspecified: Secondary | ICD-10-CM | POA: Diagnosis not present

## 2018-03-28 DIAGNOSIS — M797 Fibromyalgia: Secondary | ICD-10-CM | POA: Diagnosis not present

## 2018-03-28 DIAGNOSIS — E1140 Type 2 diabetes mellitus with diabetic neuropathy, unspecified: Secondary | ICD-10-CM | POA: Diagnosis not present

## 2018-03-28 DIAGNOSIS — Z6841 Body Mass Index (BMI) 40.0 and over, adult: Secondary | ICD-10-CM | POA: Diagnosis not present

## 2018-03-29 DIAGNOSIS — M4716 Other spondylosis with myelopathy, lumbar region: Secondary | ICD-10-CM | POA: Diagnosis not present

## 2018-06-07 DIAGNOSIS — H34812 Central retinal vein occlusion, left eye, with macular edema: Secondary | ICD-10-CM | POA: Diagnosis not present

## 2018-06-07 DIAGNOSIS — H3582 Retinal ischemia: Secondary | ICD-10-CM | POA: Diagnosis not present

## 2018-06-07 DIAGNOSIS — H348112 Central retinal vein occlusion, right eye, stable: Secondary | ICD-10-CM | POA: Diagnosis not present

## 2018-06-07 DIAGNOSIS — H3562 Retinal hemorrhage, left eye: Secondary | ICD-10-CM | POA: Diagnosis not present

## 2018-06-24 IMAGING — CR DG ABD PORTABLE 1V
1 series · 1 of 1 positions shown · non-contrast
Comparison: Chest x-ray 07/28/2015.

CLINICAL DATA: 70-year-old female status post central line
placement. Orogastric tube placement.

EXAM:
PORTABLE ABDOMEN - 1 VIEW

[AP]
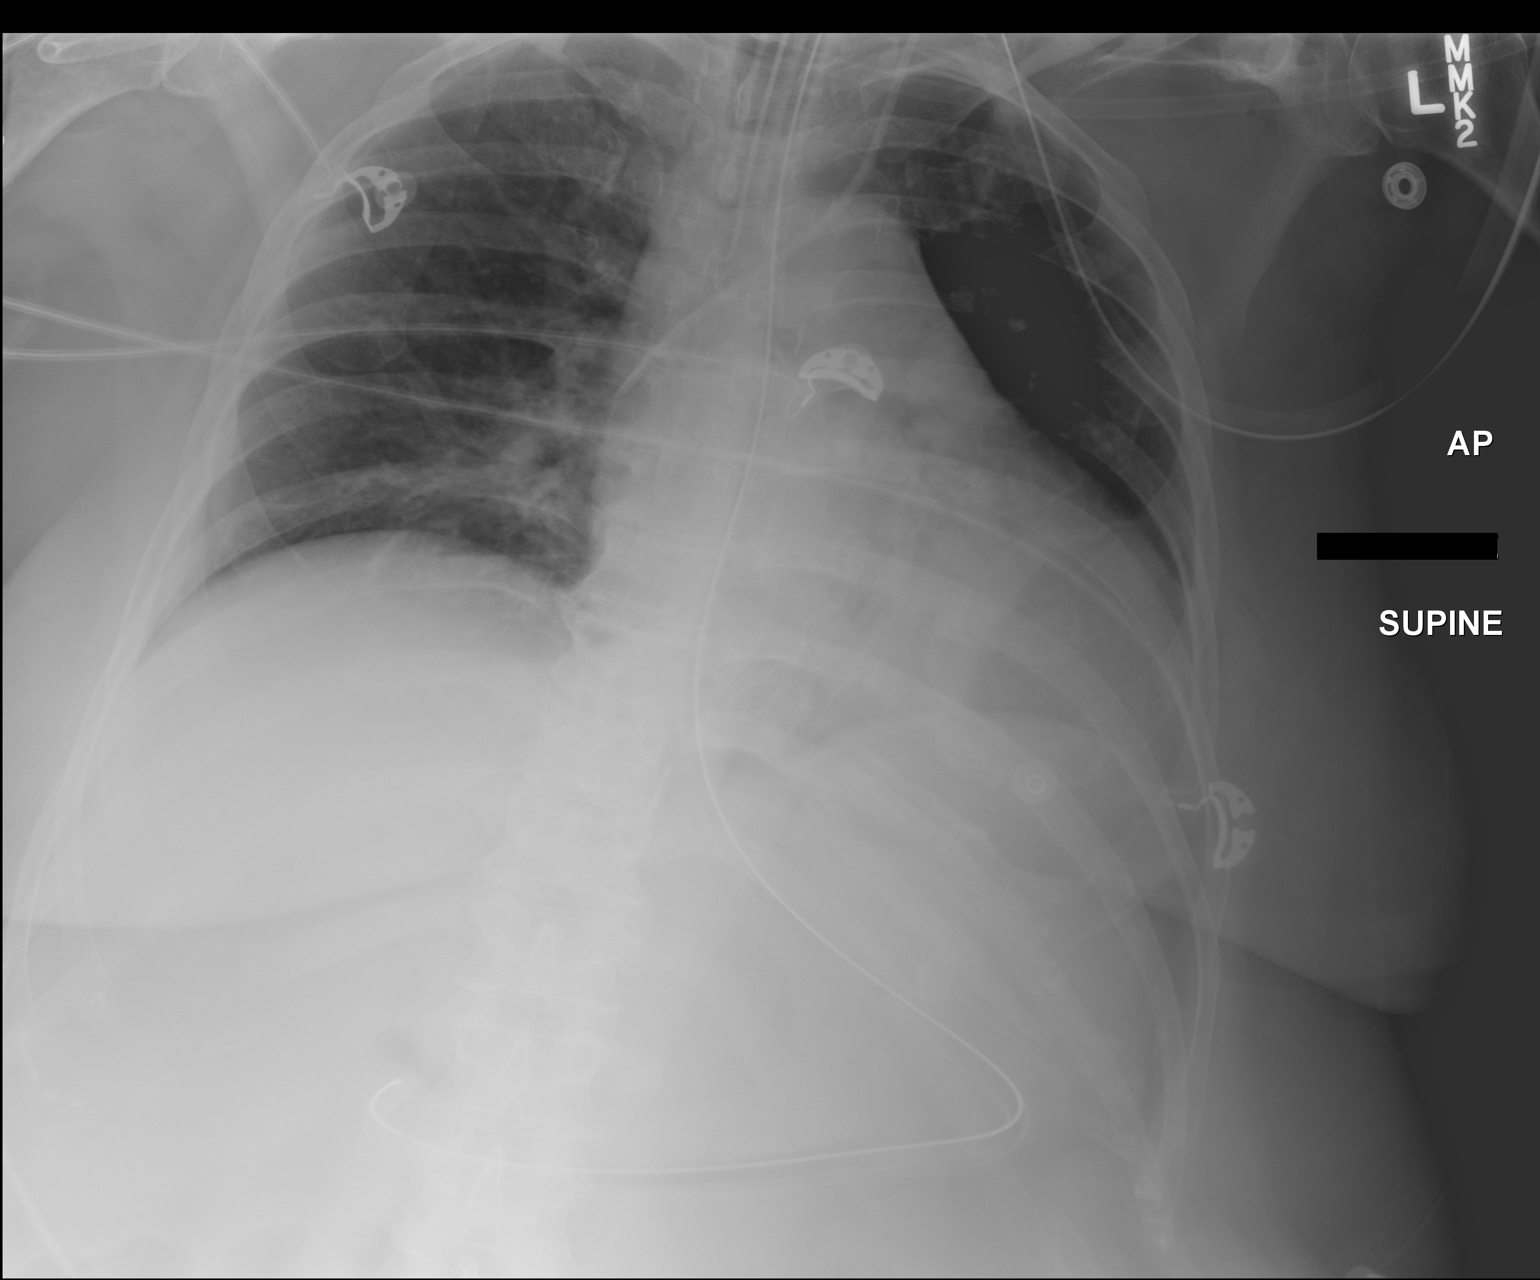

[1 of 1 positions shown; findings below may reference images not displayed]

FINDINGS: An endotracheal tube is in place with tip 2.0 cm above the carina.
Left internal jugular central venous catheter with tip terminating
in the superior cavoatrial junction. Enteric tube extending into the
antral pre-pyloric region of the stomach or proximal duodenum. Lung
volumes are low. Ill-defined bibasilar opacities may reflect areas
of atelectasis and/or airspace consolidation. No pleural effusions.
No evidence of pulmonary edema. Heart size is borderline enlarged.
The patient is rotated to the left on today's exam, resulting in
distortion of the mediastinal contours and reduced diagnostic
sensitivity and specificity for mediastinal pathology.
IMPRESSION: 1. Support apparatus, as above.
2. Low lung volumes with increasing bibasilar opacities which may
reflect areas of atelectasis and/or airspace consolidation.

## 2018-06-26 IMAGING — CR DG CHEST 1V PORT
1 series · 1 of 1 positions shown · non-contrast
Comparison: 12/28/2016

CLINICAL DATA: Intubated, seizure

EXAM:
PORTABLE CHEST 1 VIEW

[AP]
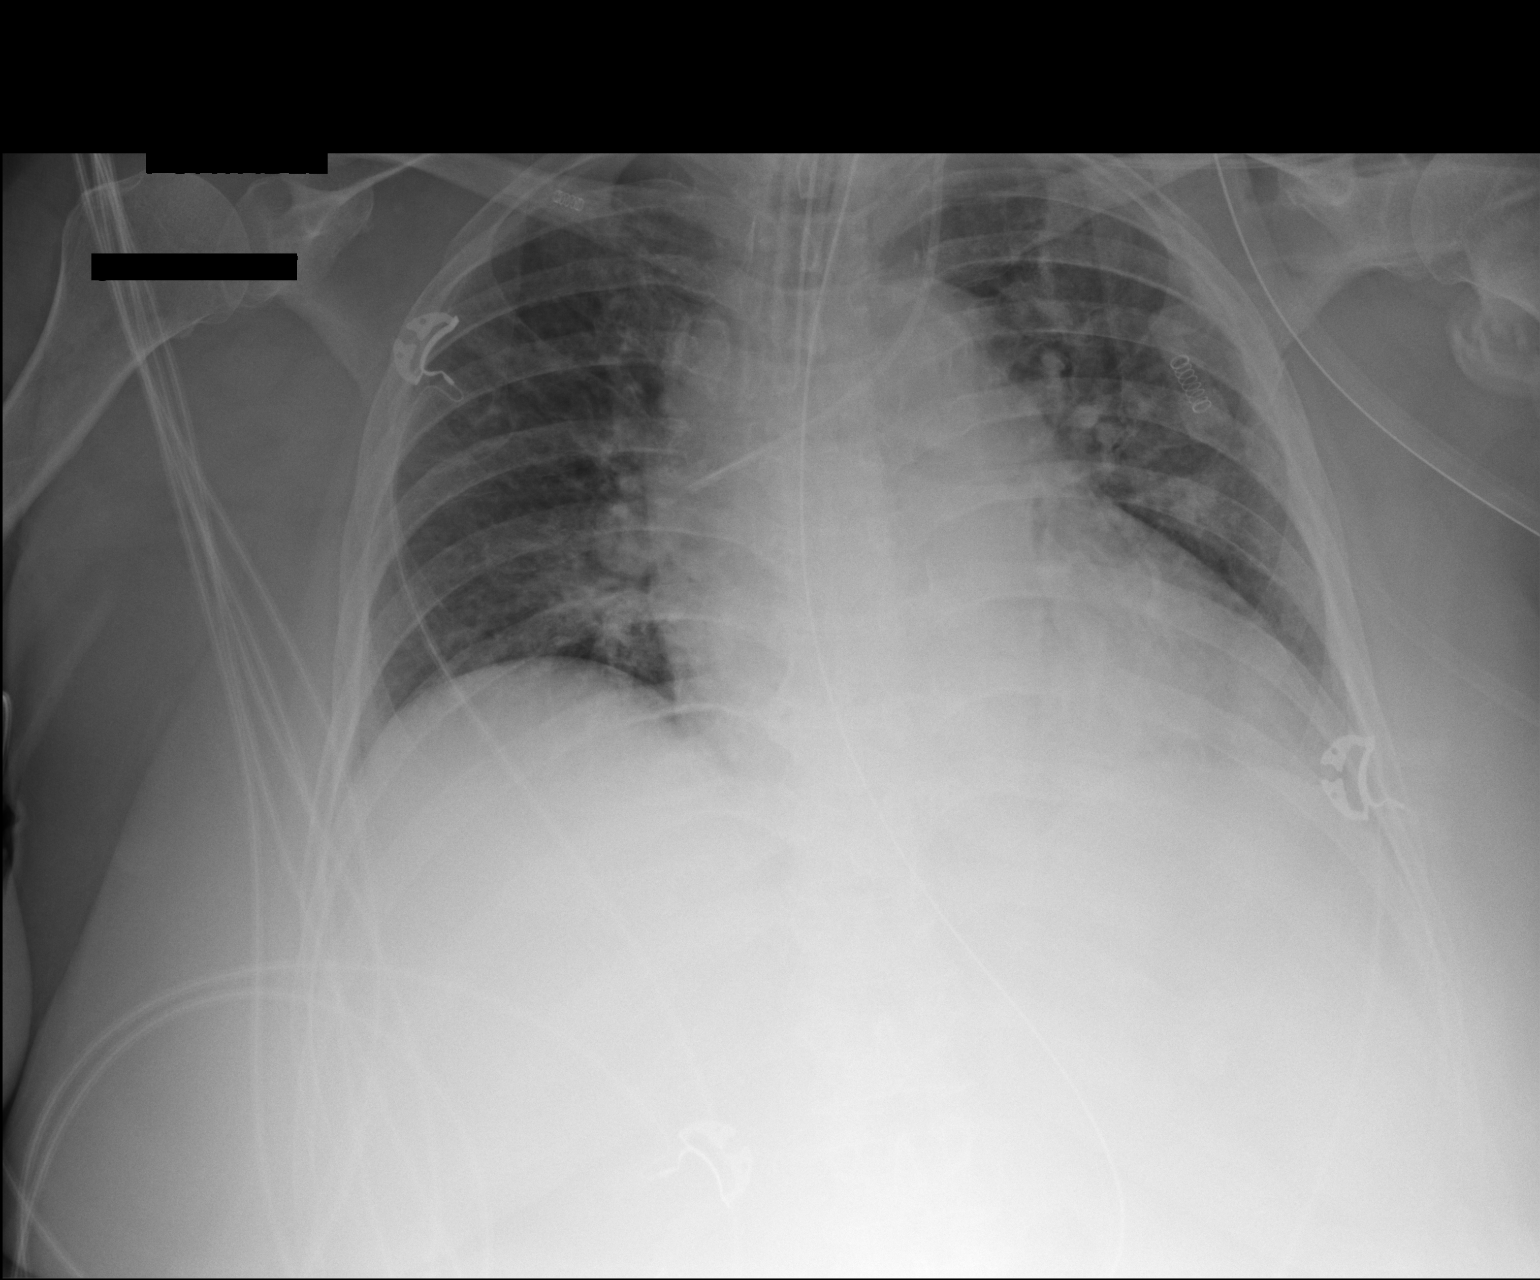

[1 of 1 positions shown; findings below may reference images not displayed]

FINDINGS: Borderline cardiomegaly. Endotracheal tube in place with tip 8 mm
above the carina. NG tube in place. Stable left IJ central line.
Central mild vascular congestion without convincing pulmonary edema.
No segmental infiltrate
IMPRESSION: Endotracheal tube in place with tip 8 mm above the carina. NG tube
in place. Stable left IJ central line. Central mild vascular
congestion without convincing pulmonary edema. No segmental
infiltrate.

## 2018-06-27 DIAGNOSIS — I10 Essential (primary) hypertension: Secondary | ICD-10-CM | POA: Diagnosis not present

## 2018-06-27 DIAGNOSIS — M797 Fibromyalgia: Secondary | ICD-10-CM | POA: Diagnosis not present

## 2018-06-27 DIAGNOSIS — F339 Major depressive disorder, recurrent, unspecified: Secondary | ICD-10-CM | POA: Diagnosis not present

## 2018-06-27 DIAGNOSIS — E1140 Type 2 diabetes mellitus with diabetic neuropathy, unspecified: Secondary | ICD-10-CM | POA: Diagnosis not present

## 2018-07-07 DIAGNOSIS — M5416 Radiculopathy, lumbar region: Secondary | ICD-10-CM | POA: Diagnosis not present

## 2018-07-07 DIAGNOSIS — M4716 Other spondylosis with myelopathy, lumbar region: Secondary | ICD-10-CM | POA: Diagnosis not present

## 2018-07-26 DIAGNOSIS — I10 Essential (primary) hypertension: Secondary | ICD-10-CM | POA: Diagnosis not present

## 2018-07-26 DIAGNOSIS — M797 Fibromyalgia: Secondary | ICD-10-CM | POA: Diagnosis not present

## 2018-07-26 DIAGNOSIS — K219 Gastro-esophageal reflux disease without esophagitis: Secondary | ICD-10-CM | POA: Diagnosis not present

## 2018-07-26 DIAGNOSIS — F339 Major depressive disorder, recurrent, unspecified: Secondary | ICD-10-CM | POA: Diagnosis not present

## 2018-07-26 DIAGNOSIS — E1165 Type 2 diabetes mellitus with hyperglycemia: Secondary | ICD-10-CM | POA: Diagnosis not present

## 2018-08-02 DIAGNOSIS — M545 Low back pain: Secondary | ICD-10-CM | POA: Diagnosis not present

## 2018-08-02 DIAGNOSIS — H5442A5 Blindness left eye category 5, normal vision right eye: Secondary | ICD-10-CM | POA: Diagnosis not present

## 2018-08-02 DIAGNOSIS — E782 Mixed hyperlipidemia: Secondary | ICD-10-CM | POA: Diagnosis not present

## 2018-08-02 DIAGNOSIS — K219 Gastro-esophageal reflux disease without esophagitis: Secondary | ICD-10-CM | POA: Diagnosis not present

## 2018-08-02 DIAGNOSIS — Z6841 Body Mass Index (BMI) 40.0 and over, adult: Secondary | ICD-10-CM | POA: Diagnosis not present

## 2018-08-02 DIAGNOSIS — G8191 Hemiplegia, unspecified affecting right dominant side: Secondary | ICD-10-CM | POA: Diagnosis not present

## 2018-08-02 DIAGNOSIS — Z23 Encounter for immunization: Secondary | ICD-10-CM | POA: Diagnosis not present

## 2018-09-27 DIAGNOSIS — E1165 Type 2 diabetes mellitus with hyperglycemia: Secondary | ICD-10-CM | POA: Diagnosis not present

## 2018-09-27 DIAGNOSIS — E782 Mixed hyperlipidemia: Secondary | ICD-10-CM | POA: Diagnosis not present

## 2018-09-27 DIAGNOSIS — I10 Essential (primary) hypertension: Secondary | ICD-10-CM | POA: Diagnosis not present

## 2018-09-27 DIAGNOSIS — E1140 Type 2 diabetes mellitus with diabetic neuropathy, unspecified: Secondary | ICD-10-CM | POA: Diagnosis not present

## 2018-09-27 DIAGNOSIS — F339 Major depressive disorder, recurrent, unspecified: Secondary | ICD-10-CM | POA: Diagnosis not present

## 2018-10-06 DIAGNOSIS — M4716 Other spondylosis with myelopathy, lumbar region: Secondary | ICD-10-CM | POA: Diagnosis not present

## 2018-10-06 DIAGNOSIS — M5416 Radiculopathy, lumbar region: Secondary | ICD-10-CM | POA: Diagnosis not present

## 2018-10-10 DIAGNOSIS — R110 Nausea: Secondary | ICD-10-CM | POA: Diagnosis not present

## 2018-10-10 DIAGNOSIS — Z6841 Body Mass Index (BMI) 40.0 and over, adult: Secondary | ICD-10-CM | POA: Diagnosis not present

## 2018-10-10 DIAGNOSIS — M791 Myalgia, unspecified site: Secondary | ICD-10-CM | POA: Diagnosis not present

## 2018-10-28 DIAGNOSIS — I10 Essential (primary) hypertension: Secondary | ICD-10-CM | POA: Diagnosis not present

## 2018-10-28 DIAGNOSIS — E782 Mixed hyperlipidemia: Secondary | ICD-10-CM | POA: Diagnosis not present

## 2018-11-07 DIAGNOSIS — R079 Chest pain, unspecified: Secondary | ICD-10-CM | POA: Diagnosis not present

## 2018-11-07 DIAGNOSIS — R531 Weakness: Secondary | ICD-10-CM | POA: Diagnosis not present

## 2018-11-07 DIAGNOSIS — M791 Myalgia, unspecified site: Secondary | ICD-10-CM | POA: Diagnosis not present

## 2018-11-07 DIAGNOSIS — R0609 Other forms of dyspnea: Secondary | ICD-10-CM | POA: Diagnosis not present

## 2018-11-07 DIAGNOSIS — I10 Essential (primary) hypertension: Secondary | ICD-10-CM | POA: Diagnosis not present

## 2018-11-07 DIAGNOSIS — Z6841 Body Mass Index (BMI) 40.0 and over, adult: Secondary | ICD-10-CM | POA: Diagnosis not present

## 2018-11-07 DIAGNOSIS — R110 Nausea: Secondary | ICD-10-CM | POA: Diagnosis not present

## 2018-11-27 DIAGNOSIS — I10 Essential (primary) hypertension: Secondary | ICD-10-CM | POA: Diagnosis not present

## 2018-11-27 DIAGNOSIS — E782 Mixed hyperlipidemia: Secondary | ICD-10-CM | POA: Diagnosis not present

## 2018-11-27 DIAGNOSIS — E1140 Type 2 diabetes mellitus with diabetic neuropathy, unspecified: Secondary | ICD-10-CM | POA: Diagnosis not present

## 2018-12-22 DIAGNOSIS — Z7689 Persons encountering health services in other specified circumstances: Secondary | ICD-10-CM | POA: Diagnosis not present

## 2018-12-22 DIAGNOSIS — E1165 Type 2 diabetes mellitus with hyperglycemia: Secondary | ICD-10-CM | POA: Diagnosis not present

## 2018-12-22 DIAGNOSIS — E1140 Type 2 diabetes mellitus with diabetic neuropathy, unspecified: Secondary | ICD-10-CM | POA: Diagnosis not present

## 2018-12-22 DIAGNOSIS — F411 Generalized anxiety disorder: Secondary | ICD-10-CM | POA: Diagnosis not present

## 2018-12-22 DIAGNOSIS — K219 Gastro-esophageal reflux disease without esophagitis: Secondary | ICD-10-CM | POA: Diagnosis not present

## 2018-12-22 DIAGNOSIS — I10 Essential (primary) hypertension: Secondary | ICD-10-CM | POA: Diagnosis not present

## 2018-12-22 DIAGNOSIS — G40909 Epilepsy, unspecified, not intractable, without status epilepticus: Secondary | ICD-10-CM | POA: Diagnosis not present

## 2018-12-22 DIAGNOSIS — E782 Mixed hyperlipidemia: Secondary | ICD-10-CM | POA: Diagnosis not present

## 2018-12-22 DIAGNOSIS — F331 Major depressive disorder, recurrent, moderate: Secondary | ICD-10-CM | POA: Diagnosis not present

## 2018-12-27 DIAGNOSIS — Z6841 Body Mass Index (BMI) 40.0 and over, adult: Secondary | ICD-10-CM | POA: Diagnosis not present

## 2018-12-27 DIAGNOSIS — Z0001 Encounter for general adult medical examination with abnormal findings: Secondary | ICD-10-CM | POA: Diagnosis not present

## 2018-12-27 DIAGNOSIS — I10 Essential (primary) hypertension: Secondary | ICD-10-CM | POA: Diagnosis not present

## 2019-01-24 DIAGNOSIS — M47816 Spondylosis without myelopathy or radiculopathy, lumbar region: Secondary | ICD-10-CM | POA: Diagnosis not present

## 2019-01-24 DIAGNOSIS — M4716 Other spondylosis with myelopathy, lumbar region: Secondary | ICD-10-CM | POA: Diagnosis not present

## 2019-01-24 DIAGNOSIS — R531 Weakness: Secondary | ICD-10-CM | POA: Diagnosis not present

## 2019-02-26 DIAGNOSIS — E1140 Type 2 diabetes mellitus with diabetic neuropathy, unspecified: Secondary | ICD-10-CM | POA: Diagnosis not present

## 2019-02-26 DIAGNOSIS — I10 Essential (primary) hypertension: Secondary | ICD-10-CM | POA: Diagnosis not present

## 2019-03-29 DIAGNOSIS — E782 Mixed hyperlipidemia: Secondary | ICD-10-CM | POA: Diagnosis not present

## 2019-03-29 DIAGNOSIS — E1140 Type 2 diabetes mellitus with diabetic neuropathy, unspecified: Secondary | ICD-10-CM | POA: Diagnosis not present

## 2019-04-24 DIAGNOSIS — F331 Major depressive disorder, recurrent, moderate: Secondary | ICD-10-CM | POA: Diagnosis not present

## 2019-04-24 DIAGNOSIS — E782 Mixed hyperlipidemia: Secondary | ICD-10-CM | POA: Diagnosis not present

## 2019-04-24 DIAGNOSIS — I10 Essential (primary) hypertension: Secondary | ICD-10-CM | POA: Diagnosis not present

## 2019-04-24 DIAGNOSIS — K219 Gastro-esophageal reflux disease without esophagitis: Secondary | ICD-10-CM | POA: Diagnosis not present

## 2019-04-24 DIAGNOSIS — G40909 Epilepsy, unspecified, not intractable, without status epilepticus: Secondary | ICD-10-CM | POA: Diagnosis not present

## 2019-04-24 DIAGNOSIS — E1165 Type 2 diabetes mellitus with hyperglycemia: Secondary | ICD-10-CM | POA: Diagnosis not present

## 2019-04-24 DIAGNOSIS — E1140 Type 2 diabetes mellitus with diabetic neuropathy, unspecified: Secondary | ICD-10-CM | POA: Diagnosis not present

## 2019-04-27 DIAGNOSIS — G8191 Hemiplegia, unspecified affecting right dominant side: Secondary | ICD-10-CM | POA: Diagnosis not present

## 2019-04-27 DIAGNOSIS — Z6841 Body Mass Index (BMI) 40.0 and over, adult: Secondary | ICD-10-CM | POA: Diagnosis not present

## 2019-04-27 DIAGNOSIS — E1142 Type 2 diabetes mellitus with diabetic polyneuropathy: Secondary | ICD-10-CM | POA: Diagnosis not present

## 2019-04-27 DIAGNOSIS — H5442A5 Blindness left eye category 5, normal vision right eye: Secondary | ICD-10-CM | POA: Diagnosis not present

## 2019-04-27 DIAGNOSIS — G252 Other specified forms of tremor: Secondary | ICD-10-CM | POA: Diagnosis not present

## 2019-04-27 DIAGNOSIS — E782 Mixed hyperlipidemia: Secondary | ICD-10-CM | POA: Diagnosis not present

## 2019-04-27 DIAGNOSIS — I10 Essential (primary) hypertension: Secondary | ICD-10-CM | POA: Diagnosis not present

## 2019-04-27 DIAGNOSIS — F331 Major depressive disorder, recurrent, moderate: Secondary | ICD-10-CM | POA: Diagnosis not present

## 2019-05-29 DIAGNOSIS — E1165 Type 2 diabetes mellitus with hyperglycemia: Secondary | ICD-10-CM | POA: Diagnosis not present

## 2019-05-29 DIAGNOSIS — E782 Mixed hyperlipidemia: Secondary | ICD-10-CM | POA: Diagnosis not present

## 2019-06-22 DIAGNOSIS — R509 Fever, unspecified: Secondary | ICD-10-CM | POA: Diagnosis not present

## 2019-06-22 DIAGNOSIS — R1110 Vomiting, unspecified: Secondary | ICD-10-CM | POA: Diagnosis not present

## 2019-06-29 DIAGNOSIS — I10 Essential (primary) hypertension: Secondary | ICD-10-CM | POA: Diagnosis not present

## 2019-06-29 DIAGNOSIS — E1165 Type 2 diabetes mellitus with hyperglycemia: Secondary | ICD-10-CM | POA: Diagnosis not present

## 2019-07-27 DIAGNOSIS — E782 Mixed hyperlipidemia: Secondary | ICD-10-CM | POA: Diagnosis not present

## 2019-07-27 DIAGNOSIS — E1165 Type 2 diabetes mellitus with hyperglycemia: Secondary | ICD-10-CM | POA: Diagnosis not present

## 2019-07-27 DIAGNOSIS — K219 Gastro-esophageal reflux disease without esophagitis: Secondary | ICD-10-CM | POA: Diagnosis not present

## 2019-07-27 DIAGNOSIS — I10 Essential (primary) hypertension: Secondary | ICD-10-CM | POA: Diagnosis not present

## 2019-07-27 DIAGNOSIS — Z7689 Persons encountering health services in other specified circumstances: Secondary | ICD-10-CM | POA: Diagnosis not present

## 2019-07-30 DIAGNOSIS — E782 Mixed hyperlipidemia: Secondary | ICD-10-CM | POA: Diagnosis not present

## 2019-07-30 DIAGNOSIS — I10 Essential (primary) hypertension: Secondary | ICD-10-CM | POA: Diagnosis not present

## 2019-08-01 DIAGNOSIS — J301 Allergic rhinitis due to pollen: Secondary | ICD-10-CM | POA: Diagnosis not present

## 2019-08-01 DIAGNOSIS — Z1331 Encounter for screening for depression: Secondary | ICD-10-CM | POA: Diagnosis not present

## 2019-08-01 DIAGNOSIS — Z23 Encounter for immunization: Secondary | ICD-10-CM | POA: Diagnosis not present

## 2019-08-01 DIAGNOSIS — H5442A5 Blindness left eye category 5, normal vision right eye: Secondary | ICD-10-CM | POA: Diagnosis not present

## 2019-08-01 DIAGNOSIS — K219 Gastro-esophageal reflux disease without esophagitis: Secondary | ICD-10-CM | POA: Diagnosis not present

## 2019-08-01 DIAGNOSIS — E1142 Type 2 diabetes mellitus with diabetic polyneuropathy: Secondary | ICD-10-CM | POA: Diagnosis not present

## 2019-08-01 DIAGNOSIS — Z9189 Other specified personal risk factors, not elsewhere classified: Secondary | ICD-10-CM | POA: Diagnosis not present

## 2019-08-01 DIAGNOSIS — E1165 Type 2 diabetes mellitus with hyperglycemia: Secondary | ICD-10-CM | POA: Diagnosis not present

## 2019-08-01 DIAGNOSIS — F331 Major depressive disorder, recurrent, moderate: Secondary | ICD-10-CM | POA: Diagnosis not present

## 2019-08-01 DIAGNOSIS — E7800 Pure hypercholesterolemia, unspecified: Secondary | ICD-10-CM | POA: Diagnosis not present

## 2019-08-01 DIAGNOSIS — E782 Mixed hyperlipidemia: Secondary | ICD-10-CM | POA: Diagnosis not present

## 2019-08-01 DIAGNOSIS — G8191 Hemiplegia, unspecified affecting right dominant side: Secondary | ICD-10-CM | POA: Diagnosis not present

## 2019-08-01 DIAGNOSIS — Z0001 Encounter for general adult medical examination with abnormal findings: Secondary | ICD-10-CM | POA: Diagnosis not present

## 2019-08-01 DIAGNOSIS — M545 Low back pain: Secondary | ICD-10-CM | POA: Diagnosis not present

## 2019-08-01 DIAGNOSIS — G252 Other specified forms of tremor: Secondary | ICD-10-CM | POA: Diagnosis not present

## 2019-08-01 DIAGNOSIS — I10 Essential (primary) hypertension: Secondary | ICD-10-CM | POA: Diagnosis not present

## 2019-08-02 DIAGNOSIS — J984 Other disorders of lung: Secondary | ICD-10-CM | POA: Diagnosis not present

## 2019-08-02 DIAGNOSIS — M545 Low back pain: Secondary | ICD-10-CM | POA: Diagnosis not present

## 2019-08-02 DIAGNOSIS — Z66 Do not resuscitate: Secondary | ICD-10-CM | POA: Diagnosis not present

## 2019-08-02 DIAGNOSIS — R6521 Severe sepsis with septic shock: Secondary | ICD-10-CM | POA: Diagnosis not present

## 2019-08-02 DIAGNOSIS — I10 Essential (primary) hypertension: Secondary | ICD-10-CM | POA: Diagnosis not present

## 2019-08-02 DIAGNOSIS — R918 Other nonspecific abnormal finding of lung field: Secondary | ICD-10-CM | POA: Diagnosis not present

## 2019-08-02 DIAGNOSIS — A419 Sepsis, unspecified organism: Secondary | ICD-10-CM | POA: Diagnosis not present

## 2019-08-02 DIAGNOSIS — E872 Acidosis: Secondary | ICD-10-CM | POA: Diagnosis not present

## 2019-08-02 DIAGNOSIS — I517 Cardiomegaly: Secondary | ICD-10-CM | POA: Diagnosis not present

## 2019-08-02 DIAGNOSIS — R6520 Severe sepsis without septic shock: Secondary | ICD-10-CM | POA: Diagnosis not present

## 2019-08-02 DIAGNOSIS — G9341 Metabolic encephalopathy: Secondary | ICD-10-CM | POA: Diagnosis not present

## 2019-08-02 DIAGNOSIS — E1142 Type 2 diabetes mellitus with diabetic polyneuropathy: Secondary | ICD-10-CM | POA: Diagnosis not present

## 2019-08-02 DIAGNOSIS — G8929 Other chronic pain: Secondary | ICD-10-CM | POA: Diagnosis not present

## 2019-08-02 DIAGNOSIS — Z95828 Presence of other vascular implants and grafts: Secondary | ICD-10-CM | POA: Diagnosis not present

## 2019-08-02 DIAGNOSIS — J986 Disorders of diaphragm: Secondary | ICD-10-CM | POA: Diagnosis not present

## 2019-08-02 DIAGNOSIS — N179 Acute kidney failure, unspecified: Secondary | ICD-10-CM | POA: Diagnosis not present

## 2019-08-02 DIAGNOSIS — R579 Shock, unspecified: Secondary | ICD-10-CM | POA: Diagnosis not present

## 2019-08-02 DIAGNOSIS — Z79899 Other long term (current) drug therapy: Secondary | ICD-10-CM | POA: Diagnosis not present

## 2019-08-02 DIAGNOSIS — Z794 Long term (current) use of insulin: Secondary | ICD-10-CM | POA: Diagnosis not present

## 2019-08-02 DIAGNOSIS — R509 Fever, unspecified: Secondary | ICD-10-CM | POA: Diagnosis not present

## 2019-08-02 DIAGNOSIS — J690 Pneumonitis due to inhalation of food and vomit: Secondary | ICD-10-CM | POA: Diagnosis not present

## 2019-08-02 DIAGNOSIS — Z885 Allergy status to narcotic agent status: Secondary | ICD-10-CM | POA: Diagnosis not present

## 2019-08-02 DIAGNOSIS — Z91013 Allergy to seafood: Secondary | ICD-10-CM | POA: Diagnosis not present

## 2019-08-02 DIAGNOSIS — Z91040 Latex allergy status: Secondary | ICD-10-CM | POA: Diagnosis not present

## 2019-08-02 DIAGNOSIS — E119 Type 2 diabetes mellitus without complications: Secondary | ICD-10-CM | POA: Diagnosis not present

## 2019-08-02 DIAGNOSIS — Z20828 Contact with and (suspected) exposure to other viral communicable diseases: Secondary | ICD-10-CM | POA: Diagnosis not present

## 2019-08-02 DIAGNOSIS — Z91041 Radiographic dye allergy status: Secondary | ICD-10-CM | POA: Diagnosis not present

## 2019-08-02 DIAGNOSIS — G894 Chronic pain syndrome: Secondary | ICD-10-CM | POA: Diagnosis not present

## 2019-08-02 DIAGNOSIS — R319 Hematuria, unspecified: Secondary | ICD-10-CM | POA: Diagnosis not present

## 2019-08-02 DIAGNOSIS — M1990 Unspecified osteoarthritis, unspecified site: Secondary | ICD-10-CM | POA: Diagnosis not present

## 2019-08-02 DIAGNOSIS — J9601 Acute respiratory failure with hypoxia: Secondary | ICD-10-CM | POA: Diagnosis not present

## 2019-08-02 DIAGNOSIS — Z515 Encounter for palliative care: Secondary | ICD-10-CM | POA: Diagnosis not present

## 2019-08-02 DIAGNOSIS — N390 Urinary tract infection, site not specified: Secondary | ICD-10-CM | POA: Diagnosis not present

## 2019-08-02 DIAGNOSIS — E1169 Type 2 diabetes mellitus with other specified complication: Secondary | ICD-10-CM | POA: Diagnosis not present

## 2019-08-02 DIAGNOSIS — E1369 Other specified diabetes mellitus with other specified complication: Secondary | ICD-10-CM | POA: Diagnosis not present

## 2019-08-02 DIAGNOSIS — K922 Gastrointestinal hemorrhage, unspecified: Secondary | ICD-10-CM | POA: Diagnosis not present

## 2019-08-02 DIAGNOSIS — F329 Major depressive disorder, single episode, unspecified: Secondary | ICD-10-CM | POA: Diagnosis not present

## 2019-08-02 DIAGNOSIS — R4020 Unspecified coma: Secondary | ICD-10-CM | POA: Diagnosis not present

## 2019-08-02 DIAGNOSIS — J189 Pneumonia, unspecified organism: Secondary | ICD-10-CM | POA: Diagnosis not present

## 2019-08-02 DIAGNOSIS — R4182 Altered mental status, unspecified: Secondary | ICD-10-CM | POA: Diagnosis not present

## 2019-08-02 DIAGNOSIS — G9340 Encephalopathy, unspecified: Secondary | ICD-10-CM | POA: Diagnosis not present

## 2019-08-02 DIAGNOSIS — I639 Cerebral infarction, unspecified: Secondary | ICD-10-CM | POA: Diagnosis not present

## 2019-08-30 DEATH — deceased
